# Patient Record
Sex: Female | Born: 1960 | Race: White | Hispanic: No | Marital: Single | Smoking: Current every day smoker
Health system: Southern US, Community
[De-identification: ages and names within clinical notes are randomized; demographics above are authoritative.]

## PROBLEM LIST (undated history)

## (undated) DIAGNOSIS — N898 Other specified noninflammatory disorders of vagina: Principal | ICD-10-CM

## (undated) DIAGNOSIS — J449 Chronic obstructive pulmonary disease, unspecified: Secondary | ICD-10-CM

## (undated) DIAGNOSIS — B1920 Unspecified viral hepatitis C without hepatic coma: Secondary | ICD-10-CM

## (undated) DIAGNOSIS — N760 Acute vaginitis: Secondary | ICD-10-CM

## (undated) DIAGNOSIS — IMO0002 Reserved for concepts with insufficient information to code with codable children: Secondary | ICD-10-CM

## (undated) DIAGNOSIS — B9689 Other specified bacterial agents as the cause of diseases classified elsewhere: Secondary | ICD-10-CM

## (undated) DIAGNOSIS — I10 Essential (primary) hypertension: Secondary | ICD-10-CM

## (undated) DIAGNOSIS — I639 Cerebral infarction, unspecified: Secondary | ICD-10-CM

## (undated) DIAGNOSIS — F319 Bipolar disorder, unspecified: Secondary | ICD-10-CM

## (undated) DIAGNOSIS — N952 Postmenopausal atrophic vaginitis: Secondary | ICD-10-CM

## (undated) DIAGNOSIS — B009 Herpesviral infection, unspecified: Secondary | ICD-10-CM

## (undated) DIAGNOSIS — E785 Hyperlipidemia, unspecified: Secondary | ICD-10-CM

## (undated) DIAGNOSIS — Z789 Other specified health status: Secondary | ICD-10-CM

## (undated) DIAGNOSIS — M329 Systemic lupus erythematosus, unspecified: Secondary | ICD-10-CM

## (undated) HISTORY — DX: Reserved for concepts with insufficient information to code with codable children: IMO0002

## (undated) HISTORY — DX: Other specified noninflammatory disorders of vagina: N89.8

## (undated) HISTORY — DX: Chronic obstructive pulmonary disease, unspecified: J44.9

## (undated) HISTORY — DX: Unspecified viral hepatitis C without hepatic coma: B19.20

## (undated) HISTORY — DX: Herpesviral infection, unspecified: B00.9

## (undated) HISTORY — DX: Systemic lupus erythematosus, unspecified: M32.9

## (undated) HISTORY — DX: Postmenopausal atrophic vaginitis: N95.2

## (undated) HISTORY — PX: FRACTURE SURGERY: SHX138

## (undated) HISTORY — DX: Other specified bacterial agents as the cause of diseases classified elsewhere: B96.89

## (undated) HISTORY — DX: Bipolar disorder, unspecified: F31.9

## (undated) HISTORY — DX: Hyperlipidemia, unspecified: E78.5

## (undated) HISTORY — DX: Acute vaginitis: N76.0

---

## 1993-12-18 HISTORY — PX: OTHER SURGICAL HISTORY: SHX169

## 2007-12-10 ENCOUNTER — Ambulatory Visit: Payer: Self-pay | Admitting: Internal Medicine

## 2007-12-10 DIAGNOSIS — J309 Allergic rhinitis, unspecified: Secondary | ICD-10-CM | POA: Insufficient documentation

## 2007-12-10 DIAGNOSIS — F17200 Nicotine dependence, unspecified, uncomplicated: Secondary | ICD-10-CM | POA: Insufficient documentation

## 2007-12-10 DIAGNOSIS — K219 Gastro-esophageal reflux disease without esophagitis: Secondary | ICD-10-CM | POA: Insufficient documentation

## 2007-12-10 DIAGNOSIS — M79609 Pain in unspecified limb: Secondary | ICD-10-CM

## 2007-12-10 DIAGNOSIS — I10 Essential (primary) hypertension: Secondary | ICD-10-CM | POA: Insufficient documentation

## 2007-12-10 DIAGNOSIS — M329 Systemic lupus erythematosus, unspecified: Secondary | ICD-10-CM

## 2007-12-10 DIAGNOSIS — B182 Chronic viral hepatitis C: Secondary | ICD-10-CM

## 2007-12-10 DIAGNOSIS — F319 Bipolar disorder, unspecified: Secondary | ICD-10-CM

## 2007-12-10 DIAGNOSIS — E785 Hyperlipidemia, unspecified: Secondary | ICD-10-CM

## 2007-12-10 DIAGNOSIS — M545 Low back pain: Secondary | ICD-10-CM | POA: Insufficient documentation

## 2007-12-10 DIAGNOSIS — E7800 Pure hypercholesterolemia, unspecified: Secondary | ICD-10-CM | POA: Insufficient documentation

## 2007-12-10 DIAGNOSIS — M129 Arthropathy, unspecified: Secondary | ICD-10-CM | POA: Insufficient documentation

## 2007-12-12 ENCOUNTER — Encounter (INDEPENDENT_AMBULATORY_CARE_PROVIDER_SITE_OTHER): Payer: Self-pay | Admitting: Internal Medicine

## 2007-12-16 ENCOUNTER — Ambulatory Visit (HOSPITAL_COMMUNITY): Admission: RE | Admit: 2007-12-16 | Discharge: 2007-12-16 | Payer: Self-pay | Admitting: Internal Medicine

## 2007-12-17 ENCOUNTER — Telehealth (INDEPENDENT_AMBULATORY_CARE_PROVIDER_SITE_OTHER): Payer: Self-pay | Admitting: Internal Medicine

## 2007-12-23 ENCOUNTER — Encounter (INDEPENDENT_AMBULATORY_CARE_PROVIDER_SITE_OTHER): Payer: Self-pay | Admitting: Internal Medicine

## 2007-12-23 ENCOUNTER — Other Ambulatory Visit: Admission: RE | Admit: 2007-12-23 | Discharge: 2007-12-23 | Payer: Self-pay | Admitting: Obstetrics and Gynecology

## 2008-01-02 ENCOUNTER — Encounter (HOSPITAL_COMMUNITY): Admission: RE | Admit: 2008-01-02 | Discharge: 2008-02-05 | Payer: Self-pay | Admitting: Internal Medicine

## 2008-01-02 ENCOUNTER — Encounter (INDEPENDENT_AMBULATORY_CARE_PROVIDER_SITE_OTHER): Payer: Self-pay | Admitting: Internal Medicine

## 2008-01-06 LAB — CONVERTED CEMR LAB
AST: 29 units/L (ref 0–37)
Albumin: 4.7 g/dL (ref 3.5–5.2)
Alkaline Phosphatase: 76 units/L (ref 39–117)
Basophils Relative: 1 % (ref 0–1)
CO2: 24 meq/L (ref 19–32)
Cholesterol: 173 mg/dL (ref 0–200)
Glucose, Bld: 78 mg/dL (ref 70–99)
HCT: 38.5 % (ref 36.0–46.0)
Hemoglobin: 13 g/dL (ref 12.0–15.0)
LDL Cholesterol: 89 mg/dL (ref 0–99)
Lymphocytes Relative: 44 % (ref 12–46)
MCV: 87.7 fL (ref 78.0–100.0)
Monocytes Absolute: 0.6 10*3/uL (ref 0.1–1.0)
Monocytes Relative: 12 % (ref 3–12)
Neutro Abs: 1.7 10*3/uL (ref 1.7–7.7)
RBC: 4.39 M/uL (ref 3.87–5.11)
Total Bilirubin: 0.6 mg/dL (ref 0.3–1.2)
Total CHOL/HDL Ratio: 2.9
VLDL: 24 mg/dL (ref 0–40)
WBC: 4.9 10*3/uL (ref 4.0–10.5)

## 2008-01-14 ENCOUNTER — Ambulatory Visit (HOSPITAL_COMMUNITY): Payer: Self-pay | Admitting: Psychiatry

## 2008-01-28 ENCOUNTER — Ambulatory Visit (HOSPITAL_COMMUNITY): Payer: Self-pay | Admitting: Psychiatry

## 2008-01-29 ENCOUNTER — Encounter (INDEPENDENT_AMBULATORY_CARE_PROVIDER_SITE_OTHER): Payer: Self-pay | Admitting: Internal Medicine

## 2008-01-29 ENCOUNTER — Ambulatory Visit (HOSPITAL_COMMUNITY): Payer: Self-pay | Admitting: Psychology

## 2008-02-04 ENCOUNTER — Encounter (INDEPENDENT_AMBULATORY_CARE_PROVIDER_SITE_OTHER): Payer: Self-pay | Admitting: Internal Medicine

## 2008-02-13 ENCOUNTER — Ambulatory Visit: Payer: Self-pay | Admitting: Internal Medicine

## 2008-02-19 ENCOUNTER — Ambulatory Visit (HOSPITAL_COMMUNITY): Payer: Self-pay | Admitting: Psychology

## 2008-03-02 ENCOUNTER — Ambulatory Visit (HOSPITAL_COMMUNITY): Payer: Self-pay | Admitting: Psychology

## 2008-03-04 ENCOUNTER — Ambulatory Visit (HOSPITAL_COMMUNITY): Admission: RE | Admit: 2008-03-04 | Discharge: 2008-03-04 | Payer: Self-pay | Admitting: Internal Medicine

## 2008-03-04 ENCOUNTER — Ambulatory Visit: Payer: Self-pay | Admitting: Internal Medicine

## 2008-03-04 DIAGNOSIS — L259 Unspecified contact dermatitis, unspecified cause: Secondary | ICD-10-CM | POA: Insufficient documentation

## 2008-03-05 ENCOUNTER — Encounter (INDEPENDENT_AMBULATORY_CARE_PROVIDER_SITE_OTHER): Payer: Self-pay | Admitting: Internal Medicine

## 2008-03-09 ENCOUNTER — Encounter (INDEPENDENT_AMBULATORY_CARE_PROVIDER_SITE_OTHER): Payer: Self-pay | Admitting: Internal Medicine

## 2008-03-17 ENCOUNTER — Telehealth (INDEPENDENT_AMBULATORY_CARE_PROVIDER_SITE_OTHER): Payer: Self-pay | Admitting: Internal Medicine

## 2008-03-17 ENCOUNTER — Ambulatory Visit (HOSPITAL_COMMUNITY): Payer: Self-pay | Admitting: Psychiatry

## 2008-03-31 ENCOUNTER — Encounter (INDEPENDENT_AMBULATORY_CARE_PROVIDER_SITE_OTHER): Payer: Self-pay | Admitting: Internal Medicine

## 2008-04-07 ENCOUNTER — Ambulatory Visit: Payer: Self-pay | Admitting: Internal Medicine

## 2008-04-07 DIAGNOSIS — R740 Nonspecific elevation of levels of transaminase and lactic acid dehydrogenase [LDH]: Secondary | ICD-10-CM

## 2008-04-07 DIAGNOSIS — R7402 Elevation of levels of lactic acid dehydrogenase (LDH): Secondary | ICD-10-CM | POA: Insufficient documentation

## 2008-04-09 ENCOUNTER — Ambulatory Visit (HOSPITAL_COMMUNITY): Payer: Self-pay | Admitting: Psychiatry

## 2008-04-10 LAB — CONVERTED CEMR LAB
ALT: 65 units/L — ABNORMAL HIGH (ref 0–35)
AST: 40 units/L — ABNORMAL HIGH (ref 0–37)
Albumin: 4.5 g/dL (ref 3.5–5.2)
Alkaline Phosphatase: 97 units/L (ref 39–117)
Basophils Absolute: 0.1 10*3/uL (ref 0.0–0.1)
Basophils Relative: 1 % (ref 0–1)
Eosinophils Absolute: 0.4 10*3/uL (ref 0.0–0.7)
Glucose, Bld: 104 mg/dL — ABNORMAL HIGH (ref 70–99)
HCT: 36.6 % (ref 36.0–46.0)
Hemoglobin: 12.5 g/dL (ref 12.0–15.0)
MCHC: 34.2 g/dL (ref 30.0–36.0)
Monocytes Relative: 10 % (ref 3–12)
Platelets: 170 10*3/uL (ref 150–400)
RDW: 12.5 % (ref 11.5–15.5)
WBC: 5.9 10*3/uL (ref 4.0–10.5)

## 2008-04-28 ENCOUNTER — Telehealth (INDEPENDENT_AMBULATORY_CARE_PROVIDER_SITE_OTHER): Payer: Self-pay | Admitting: *Deleted

## 2008-05-18 ENCOUNTER — Ambulatory Visit (HOSPITAL_COMMUNITY): Payer: Self-pay | Admitting: Psychology

## 2008-06-03 ENCOUNTER — Encounter (INDEPENDENT_AMBULATORY_CARE_PROVIDER_SITE_OTHER): Payer: Self-pay | Admitting: Internal Medicine

## 2008-06-05 ENCOUNTER — Ambulatory Visit (HOSPITAL_COMMUNITY): Payer: Self-pay | Admitting: Psychology

## 2008-06-15 ENCOUNTER — Encounter (INDEPENDENT_AMBULATORY_CARE_PROVIDER_SITE_OTHER): Payer: Self-pay | Admitting: Internal Medicine

## 2008-06-25 ENCOUNTER — Ambulatory Visit (HOSPITAL_COMMUNITY): Payer: Self-pay | Admitting: Psychiatry

## 2008-06-26 ENCOUNTER — Ambulatory Visit (HOSPITAL_COMMUNITY): Payer: Self-pay | Admitting: Psychology

## 2008-07-09 ENCOUNTER — Ambulatory Visit: Payer: Self-pay | Admitting: Gastroenterology

## 2008-07-17 ENCOUNTER — Ambulatory Visit (HOSPITAL_COMMUNITY): Payer: Self-pay | Admitting: Psychology

## 2008-07-28 ENCOUNTER — Encounter (INDEPENDENT_AMBULATORY_CARE_PROVIDER_SITE_OTHER): Payer: Self-pay | Admitting: Internal Medicine

## 2008-08-20 ENCOUNTER — Telehealth (INDEPENDENT_AMBULATORY_CARE_PROVIDER_SITE_OTHER): Payer: Self-pay | Admitting: Internal Medicine

## 2008-08-20 ENCOUNTER — Ambulatory Visit (HOSPITAL_COMMUNITY): Payer: Self-pay | Admitting: Psychiatry

## 2008-08-27 ENCOUNTER — Ambulatory Visit (HOSPITAL_COMMUNITY): Payer: Self-pay | Admitting: Psychology

## 2008-09-10 ENCOUNTER — Ambulatory Visit: Payer: Self-pay | Admitting: Gastroenterology

## 2008-09-11 ENCOUNTER — Ambulatory Visit (HOSPITAL_COMMUNITY): Payer: Self-pay | Admitting: Psychology

## 2008-09-18 ENCOUNTER — Encounter (INDEPENDENT_AMBULATORY_CARE_PROVIDER_SITE_OTHER): Payer: Self-pay | Admitting: Internal Medicine

## 2008-09-28 ENCOUNTER — Ambulatory Visit: Payer: Self-pay | Admitting: Internal Medicine

## 2008-09-28 ENCOUNTER — Ambulatory Visit (HOSPITAL_COMMUNITY): Payer: Self-pay | Admitting: Psychology

## 2008-09-28 DIAGNOSIS — L299 Pruritus, unspecified: Secondary | ICD-10-CM | POA: Insufficient documentation

## 2008-09-29 ENCOUNTER — Encounter (INDEPENDENT_AMBULATORY_CARE_PROVIDER_SITE_OTHER): Payer: Self-pay | Admitting: Internal Medicine

## 2008-09-29 LAB — CONVERTED CEMR LAB
ALT: 18 units/L (ref 0–35)
Albumin: 4.8 g/dL (ref 3.5–5.2)
Alkaline Phosphatase: 118 units/L — ABNORMAL HIGH (ref 39–117)
Anti Nuclear Antibody(ANA): NEGATIVE
Basophils Absolute: 0.1 10*3/uL (ref 0.0–0.1)
Basophils Relative: 1 % (ref 0–1)
Calcium: 10.2 mg/dL (ref 8.4–10.5)
Eosinophils Absolute: 0.5 10*3/uL (ref 0.0–0.7)
Eosinophils Relative: 8 % — ABNORMAL HIGH (ref 0–5)
Lymphocytes Relative: 41 % (ref 12–46)
Lymphs Abs: 2.2 10*3/uL (ref 0.7–4.0)
MCHC: 33.9 g/dL (ref 30.0–36.0)
MCV: 84.8 fL (ref 78.0–100.0)
Monocytes Absolute: 0.4 10*3/uL (ref 0.1–1.0)
Platelets: 172 10*3/uL (ref 150–400)
RBC: 4.94 M/uL (ref 3.87–5.11)
RDW: 13 % (ref 11.5–15.5)
Sed Rate: 17 mm/hr (ref 0–22)
Total Protein: 8.6 g/dL — ABNORMAL HIGH (ref 6.0–8.3)
WBC: 5.4 10*3/uL (ref 4.0–10.5)

## 2008-09-30 ENCOUNTER — Telehealth (INDEPENDENT_AMBULATORY_CARE_PROVIDER_SITE_OTHER): Payer: Self-pay | Admitting: *Deleted

## 2008-10-08 ENCOUNTER — Ambulatory Visit: Payer: Self-pay | Admitting: Gastroenterology

## 2008-10-13 ENCOUNTER — Ambulatory Visit (HOSPITAL_COMMUNITY): Payer: Self-pay | Admitting: Psychology

## 2008-10-20 ENCOUNTER — Encounter (INDEPENDENT_AMBULATORY_CARE_PROVIDER_SITE_OTHER): Payer: Self-pay | Admitting: Internal Medicine

## 2008-10-29 ENCOUNTER — Ambulatory Visit: Payer: Self-pay | Admitting: Internal Medicine

## 2008-10-29 ENCOUNTER — Ambulatory Visit (HOSPITAL_COMMUNITY): Payer: Self-pay | Admitting: Psychiatry

## 2008-11-16 ENCOUNTER — Encounter (INDEPENDENT_AMBULATORY_CARE_PROVIDER_SITE_OTHER): Payer: Self-pay | Admitting: Internal Medicine

## 2008-11-17 ENCOUNTER — Ambulatory Visit (HOSPITAL_COMMUNITY): Payer: Self-pay | Admitting: Psychiatry

## 2008-11-20 ENCOUNTER — Ambulatory Visit (HOSPITAL_COMMUNITY): Payer: Self-pay | Admitting: Psychology

## 2008-12-21 ENCOUNTER — Encounter (INDEPENDENT_AMBULATORY_CARE_PROVIDER_SITE_OTHER): Payer: Self-pay | Admitting: Internal Medicine

## 2011-05-15 DIAGNOSIS — F3163 Bipolar disorder, current episode mixed, severe, without psychotic features: Secondary | ICD-10-CM | POA: Diagnosis not present

## 2011-06-26 DIAGNOSIS — F3163 Bipolar disorder, current episode mixed, severe, without psychotic features: Secondary | ICD-10-CM | POA: Diagnosis not present

## 2011-07-03 DIAGNOSIS — F3163 Bipolar disorder, current episode mixed, severe, without psychotic features: Secondary | ICD-10-CM | POA: Diagnosis not present

## 2011-09-19 DIAGNOSIS — F3163 Bipolar disorder, current episode mixed, severe, without psychotic features: Secondary | ICD-10-CM | POA: Diagnosis not present

## 2011-09-25 DIAGNOSIS — F3163 Bipolar disorder, current episode mixed, severe, without psychotic features: Secondary | ICD-10-CM | POA: Diagnosis not present

## 2011-10-04 DIAGNOSIS — F3163 Bipolar disorder, current episode mixed, severe, without psychotic features: Secondary | ICD-10-CM | POA: Diagnosis not present

## 2011-10-04 DIAGNOSIS — F17200 Nicotine dependence, unspecified, uncomplicated: Secondary | ICD-10-CM | POA: Diagnosis not present

## 2011-10-04 DIAGNOSIS — E785 Hyperlipidemia, unspecified: Secondary | ICD-10-CM | POA: Diagnosis not present

## 2011-10-04 DIAGNOSIS — J449 Chronic obstructive pulmonary disease, unspecified: Secondary | ICD-10-CM | POA: Diagnosis not present

## 2011-10-04 DIAGNOSIS — M79609 Pain in unspecified limb: Secondary | ICD-10-CM | POA: Diagnosis not present

## 2011-12-08 DIAGNOSIS — F3163 Bipolar disorder, current episode mixed, severe, without psychotic features: Secondary | ICD-10-CM | POA: Diagnosis not present

## 2012-02-05 DIAGNOSIS — F3163 Bipolar disorder, current episode mixed, severe, without psychotic features: Secondary | ICD-10-CM | POA: Diagnosis not present

## 2012-03-08 DIAGNOSIS — F17200 Nicotine dependence, unspecified, uncomplicated: Secondary | ICD-10-CM | POA: Diagnosis not present

## 2012-03-08 DIAGNOSIS — Z23 Encounter for immunization: Secondary | ICD-10-CM | POA: Diagnosis not present

## 2012-03-08 DIAGNOSIS — B009 Herpesviral infection, unspecified: Secondary | ICD-10-CM | POA: Diagnosis not present

## 2012-03-08 DIAGNOSIS — N950 Postmenopausal bleeding: Secondary | ICD-10-CM | POA: Diagnosis not present

## 2012-03-08 DIAGNOSIS — J449 Chronic obstructive pulmonary disease, unspecified: Secondary | ICD-10-CM | POA: Diagnosis not present

## 2012-04-01 DIAGNOSIS — F3163 Bipolar disorder, current episode mixed, severe, without psychotic features: Secondary | ICD-10-CM | POA: Diagnosis not present

## 2012-04-30 DIAGNOSIS — N950 Postmenopausal bleeding: Secondary | ICD-10-CM | POA: Diagnosis not present

## 2012-04-30 DIAGNOSIS — N9489 Other specified conditions associated with female genital organs and menstrual cycle: Secondary | ICD-10-CM | POA: Diagnosis not present

## 2012-05-07 DIAGNOSIS — N950 Postmenopausal bleeding: Secondary | ICD-10-CM | POA: Diagnosis not present

## 2012-05-21 DIAGNOSIS — N950 Postmenopausal bleeding: Secondary | ICD-10-CM | POA: Diagnosis not present

## 2012-05-21 DIAGNOSIS — N952 Postmenopausal atrophic vaginitis: Secondary | ICD-10-CM | POA: Diagnosis not present

## 2012-06-04 DIAGNOSIS — Z01419 Encounter for gynecological examination (general) (routine) without abnormal findings: Secondary | ICD-10-CM | POA: Diagnosis not present

## 2012-06-04 DIAGNOSIS — N950 Postmenopausal bleeding: Secondary | ICD-10-CM | POA: Diagnosis not present

## 2012-06-04 DIAGNOSIS — N952 Postmenopausal atrophic vaginitis: Secondary | ICD-10-CM | POA: Diagnosis not present

## 2012-08-02 DIAGNOSIS — Z006 Encounter for examination for normal comparison and control in clinical research program: Secondary | ICD-10-CM | POA: Diagnosis not present

## 2012-08-02 DIAGNOSIS — E782 Mixed hyperlipidemia: Secondary | ICD-10-CM | POA: Diagnosis not present

## 2012-08-02 DIAGNOSIS — F3161 Bipolar disorder, current episode mixed, mild: Secondary | ICD-10-CM | POA: Diagnosis not present

## 2012-08-02 DIAGNOSIS — G4700 Insomnia, unspecified: Secondary | ICD-10-CM | POA: Diagnosis not present

## 2012-08-02 DIAGNOSIS — F3162 Bipolar disorder, current episode mixed, moderate: Secondary | ICD-10-CM | POA: Diagnosis not present

## 2012-09-04 DIAGNOSIS — Z1231 Encounter for screening mammogram for malignant neoplasm of breast: Secondary | ICD-10-CM | POA: Diagnosis not present

## 2012-09-04 DIAGNOSIS — Z803 Family history of malignant neoplasm of breast: Secondary | ICD-10-CM | POA: Diagnosis not present

## 2012-09-23 DIAGNOSIS — F3163 Bipolar disorder, current episode mixed, severe, without psychotic features: Secondary | ICD-10-CM | POA: Diagnosis not present

## 2012-12-09 DIAGNOSIS — F3163 Bipolar disorder, current episode mixed, severe, without psychotic features: Secondary | ICD-10-CM | POA: Diagnosis not present

## 2013-01-27 DIAGNOSIS — R1013 Epigastric pain: Secondary | ICD-10-CM | POA: Diagnosis not present

## 2013-01-27 DIAGNOSIS — K273 Acute peptic ulcer, site unspecified, without hemorrhage or perforation: Secondary | ICD-10-CM | POA: Diagnosis not present

## 2013-02-06 DIAGNOSIS — R109 Unspecified abdominal pain: Secondary | ICD-10-CM | POA: Diagnosis not present

## 2013-04-23 DIAGNOSIS — J0120 Acute ethmoidal sinusitis, unspecified: Secondary | ICD-10-CM | POA: Diagnosis not present

## 2013-04-23 DIAGNOSIS — J069 Acute upper respiratory infection, unspecified: Secondary | ICD-10-CM | POA: Diagnosis not present

## 2013-11-21 DIAGNOSIS — R7309 Other abnormal glucose: Secondary | ICD-10-CM | POA: Diagnosis not present

## 2013-11-21 DIAGNOSIS — Z0000 Encounter for general adult medical examination without abnormal findings: Secondary | ICD-10-CM | POA: Diagnosis not present

## 2013-11-21 DIAGNOSIS — M549 Dorsalgia, unspecified: Secondary | ICD-10-CM | POA: Diagnosis not present

## 2013-11-21 DIAGNOSIS — E7800 Pure hypercholesterolemia, unspecified: Secondary | ICD-10-CM | POA: Diagnosis not present

## 2013-11-21 DIAGNOSIS — K219 Gastro-esophageal reflux disease without esophagitis: Secondary | ICD-10-CM | POA: Diagnosis not present

## 2013-12-10 ENCOUNTER — Telehealth: Payer: Self-pay

## 2013-12-10 DIAGNOSIS — F17200 Nicotine dependence, unspecified, uncomplicated: Secondary | ICD-10-CM | POA: Diagnosis not present

## 2013-12-10 DIAGNOSIS — E7800 Pure hypercholesterolemia, unspecified: Secondary | ICD-10-CM | POA: Diagnosis not present

## 2013-12-10 DIAGNOSIS — B182 Chronic viral hepatitis C: Secondary | ICD-10-CM | POA: Diagnosis not present

## 2013-12-10 NOTE — Telephone Encounter (Signed)
Gastroenterology Pre-Procedure Review  Request Date: Requesting Physician: Fanta  PATIENT REVIEW QUESTIONS: The patient responded to the following health history questions as indicated:    1. Diabetes Melitis: NO 2. Joint replacements in the past 12 months: NO 3. Major health problems in the past 3 months: NO 4. Has an artificial valve or MVP: NO 5. Has a defibrillator: NO 6. Has been advised in past to take antibiotics in advance of a procedure like teeth cleaning: NO 7. Acholic: NO 8. Family history:YES(aunt)     MEDICATIONS & ALLERGIES:    NKDA  Patient reports the following regarding taking any blood thinners:   Plavix? NO Aspirin? NO Coumadin? NO  Patient confirms/reports the following medications:  No current outpatient prescriptions on file.   No current facility-administered medications for this visit.    Patient confirms/reports the following allergies:  Allergies  Allergen Reactions  . Erythromycin     REACTION: GI upset    No orders of the defined types were placed in this encounter.    AUTHORIZATION INFORMATION Primary Insurance: Medicare,  ID #: [id number]  Group #: Pre-Cert / Josem Kaufmann required: Pre-Cert / Auth #:   Secondary Insurance: Medicaid,  ID #: [id number]   Group #:  Pre-Cert / Auth required:  Pre-Cert / Auth #:  SCHEDULE INFORMATION: Procedure has been scheduled as follows:  Date:  Time:  Location:   This Gastroenterology Pre-Precedure Review Form is being routed to the following provider(s):

## 2013-12-11 ENCOUNTER — Encounter: Payer: Self-pay | Admitting: Internal Medicine

## 2013-12-12 NOTE — Telephone Encounter (Signed)
PT called and is scheduled for 12/24/2013 at 8:30 AM with Dr. Oneida Alar.

## 2013-12-12 NOTE — Telephone Encounter (Signed)
Time for procedure will be 9:30 Am not 8:30 and pt is aware. ( Someone got put on before her).

## 2013-12-12 NOTE — Telephone Encounter (Signed)
LMOM for pt to call for date and time for colonoscopy.

## 2013-12-12 NOTE — Telephone Encounter (Signed)
Pt is returning your call. Please call her back.

## 2013-12-17 ENCOUNTER — Encounter (HOSPITAL_COMMUNITY): Payer: Self-pay | Admitting: Pharmacy Technician

## 2013-12-17 ENCOUNTER — Other Ambulatory Visit: Payer: Self-pay

## 2013-12-17 DIAGNOSIS — Z1211 Encounter for screening for malignant neoplasm of colon: Secondary | ICD-10-CM

## 2013-12-17 NOTE — Telephone Encounter (Signed)
MOVI PREP SPLIT DOSING- FULL LIQUIDS WITH BREAKFAST.  Full Liquid Diet A high-calorie, high-protein supplement should be used to meet your nutritional requirements when the full liquid diet is continued for more than 2 or 3 days. If this diet is to be used for an extended period of time (more than 7 days), a multivitamin should be considered.  Breads and Starches  Allowed: None are allowed except crackers WHOLE OR pureed (made into a thick, smooth soup) in soup. Avoid: Any others.    Potatoes/Pasta/Rice  Allowed: ANY ITEM AS A SOUP OR SMALL PLATE OF MASHED POTATOES..       Vegetables  Allowed: Strained tomato or vegetable juice. Vegetables pureed in soup.   Avoid: Any others.    Fruit  Allowed: Any strained fruit juices and fruit drinks. Include 1 serving of citrus or vitamin C-enriched fruit juice daily.   Avoid: Any others.  Meat and Meat Substitutes  Allowed: Egg  Avoid: Any meat, fish, or fowl. All cheese.  Milk  Allowed: Milk beverages, including milk shakes and instant breakfast mixes. Smooth yogurt.   Avoid: Any others. Avoid dairy products if not tolerated.    Soups and Combination Foods  Allowed: Broth, strained cream soups. Strained, broth-based soups.   Avoid: Any others.    Desserts and Sweets  Allowed: flavored gelatin, tapioca, plain ice cream, sherbet, smooth pudding, junket, fruit ices, frozen ice pops, pudding pops,, frozen fudge pops, chocolate syrup. Sugar, honey, jelly, syrup.   Avoid: Any others.  Fats and Oils  Allowed: Margarine, butter, cream, sour cream, oils.   Avoid: Any others.  Beverages  Allowed: All.   Avoid: None.  Condiments  Allowed: Iodized salt, pepper, spices, flavorings. Cocoa powder.   Avoid: Any others.    SAMPLE MEAL PLAN Breakfast   cup orange juice.   1 EGG  1 cup  milk.   1 cup beverage (coffee or tea).   Cream or sugar, if desired.    Midmorning Snack  2 SCRAMBLED OR HARD BOILED  EGG   Lunch  1 cup cream soup.    cup fruit juice.   1 cup milk.    cup custard.   1 cup beverage (coffee or tea).   Cream or sugar, if desired.    Midafternoon Snack  1 cup milk shake.  Dinner  1 cup cream soup.    cup fruit juice.   1 cup milk.    cup pudding.   1 cup beverage (coffee or tea).   Cream or sugar, if desired.  Evening Snack  1 cup supplement.  To increase calories, add sugar, cream, butter, or margarine if possible. Nutritional supplements will also increase the total calories.

## 2013-12-18 MED ORDER — PEG-KCL-NACL-NASULF-NA ASC-C 100 G PO SOLR: 1.0000 | ORAL | Status: DC

## 2013-12-18 NOTE — Telephone Encounter (Signed)
Rx sent to the pharmacy and instructions mailed to pt.  

## 2013-12-18 NOTE — Addendum Note (Signed)
Addended by: Everardo All on: 12/18/2013 02:55 PM   Modules accepted: Orders

## 2013-12-24 ENCOUNTER — Ambulatory Visit (HOSPITAL_COMMUNITY)
Admission: RE | Admit: 2013-12-24 | Discharge: 2013-12-24 | Disposition: A | Discharge status: Home / Self Care | Payer: Medicare Other | Source: Ambulatory Visit | Attending: Gastroenterology | Admitting: Gastroenterology

## 2013-12-24 ENCOUNTER — Encounter (HOSPITAL_COMMUNITY): Payer: Self-pay

## 2013-12-24 ENCOUNTER — Encounter (HOSPITAL_COMMUNITY)
Admission: RE | Disposition: A | Discharge status: Home / Self Care | Payer: Self-pay | Source: Ambulatory Visit | Attending: Gastroenterology

## 2013-12-24 DIAGNOSIS — Q438 Other specified congenital malformations of intestine: Secondary | ICD-10-CM | POA: Insufficient documentation

## 2013-12-24 DIAGNOSIS — K648 Other hemorrhoids: Secondary | ICD-10-CM | POA: Insufficient documentation

## 2013-12-24 DIAGNOSIS — Z79899 Other long term (current) drug therapy: Secondary | ICD-10-CM | POA: Insufficient documentation

## 2013-12-24 DIAGNOSIS — D126 Benign neoplasm of colon, unspecified: Secondary | ICD-10-CM | POA: Diagnosis not present

## 2013-12-24 DIAGNOSIS — Z1211 Encounter for screening for malignant neoplasm of colon: Secondary | ICD-10-CM

## 2013-12-24 DIAGNOSIS — Z881 Allergy status to other antibiotic agents status: Secondary | ICD-10-CM | POA: Insufficient documentation

## 2013-12-24 DIAGNOSIS — F17200 Nicotine dependence, unspecified, uncomplicated: Secondary | ICD-10-CM | POA: Insufficient documentation

## 2013-12-24 DIAGNOSIS — Z800 Family history of malignant neoplasm of digestive organs: Secondary | ICD-10-CM | POA: Insufficient documentation

## 2013-12-24 HISTORY — DX: Other specified health status: Z78.9

## 2013-12-24 HISTORY — PX: COLONOSCOPY: SHX5424

## 2013-12-24 SURGERY — COLONOSCOPY
Anesthesia: Moderate Sedation

## 2013-12-24 MED ORDER — MEPERIDINE HCL 100 MG/ML IJ SOLN
INTRAMUSCULAR | Status: AC
  Filled 2013-12-24: qty 2

## 2013-12-24 MED ORDER — MEPERIDINE HCL 100 MG/ML IJ SOLN
INTRAMUSCULAR | Status: DC | PRN
  Administered 2013-12-24 (×3): 25 mg via INTRAVENOUS

## 2013-12-24 MED ORDER — SODIUM CHLORIDE 0.9 % IV SOLN
INTRAVENOUS | Status: DC
  Administered 2013-12-24: 09:00:00 via INTRAVENOUS

## 2013-12-24 MED ORDER — MIDAZOLAM HCL 5 MG/5ML IJ SOLN
INTRAMUSCULAR | Status: DC | PRN
  Administered 2013-12-24: 2 mg via INTRAVENOUS
  Administered 2013-12-24: 1 mg via INTRAVENOUS
  Administered 2013-12-24: 2 mg via INTRAVENOUS

## 2013-12-24 MED ORDER — MIDAZOLAM HCL 5 MG/5ML IJ SOLN
INTRAMUSCULAR | Status: AC
  Filled 2013-12-24: qty 10

## 2013-12-24 MED ORDER — STERILE WATER FOR IRRIGATION IR SOLN
Status: DC | PRN
  Administered 2013-12-24: 09:00:00

## 2013-12-24 NOTE — Discharge Instructions (Signed)
You had 2 polyps removed.    FOLLOW A HIGH FIBER DIET. AVOID ITEMS THAT CAUSE BLOATING. SEE INFO BELOW.  YOUR BIOPSY RESULTS SHOULD BE BACK IN 14 DAYS.  Next colonoscopy in 3-5 years.  Colonoscopy Care After Read the instructions outlined below and refer to this sheet in the next week. These discharge instructions provide you with general information on caring for yourself after you leave the hospital. While your treatment has been planned according to the most current medical practices available, unavoidable complications occasionally occur. If you have any problems or questions after discharge, call DR. Chelsey Redondo, [phone].  ACTIVITY  You may resume your regular activity, but move at a slower pace for the next 24 hours.   Take frequent rest periods for the next 24 hours.   Walking will help get rid of the air and reduce the bloated feeling in your belly (abdomen).   No driving for 24 hours (because of the medicine (anesthesia) used during the test).   You may shower.   Do not sign any important legal documents or operate any machinery for 24 hours (because of the anesthesia used during the test).    NUTRITION  Drink plenty of fluids.   You may resume your normal diet as instructed by your doctor.   Begin with a light meal and progress to your normal diet. Heavy or fried foods are harder to digest and may make you feel sick to your stomach (nauseated).   Avoid alcoholic beverages for 24 hours or as instructed.    MEDICATIONS  You may resume your normal medications.   WHAT YOU CAN EXPECT TODAY  Some feelings of bloating in the abdomen.   Passage of more gas than usual.   Spotting of blood in your stool or on the toilet paper  .  IF YOU HAD POLYPS REMOVED DURING THE COLONOSCOPY:  Eat a soft diet IF YOU HAVE NAUSEA, BLOATING, ABDOMINAL PAIN, OR VOMITING.    FINDING OUT THE RESULTS OF YOUR TEST Not all test results are available during your visit. DR. Oneida Alar  WILL CALL YOU WITHIN 7 DAYS OF YOUR PROCEDUE WITH YOUR RESULTS. Do not assume everything is normal if you have not heard from DR. Brandon Scarbrough IN ONE WEEK, CALL HER OFFICE AT [phone].  SEEK IMMEDIATE MEDICAL ATTENTION AND CALL THE OFFICE: [phone] IF:  You have more than a spotting of blood in your stool.   Your belly is swollen (abdominal distention).   You are nauseated or vomiting.   You have a temperature over 101F.   You have abdominal pain or discomfort that is severe or gets worse throughout the day.   High-Fiber Diet A high-fiber diet changes your normal diet to include more whole grains, legumes, fruits, and vegetables. Changes in the diet involve replacing refined carbohydrates with unrefined foods. The calorie level of the diet is essentially unchanged. The Dietary Reference Intake (recommended amount) for adult males is 38 grams per day. For adult females, it is 25 grams per day. Pregnant and lactating women should consume 28 grams of fiber per day. Fiber is the intact part of a plant that is not broken down during digestion. Functional fiber is fiber that has been isolated from the plant to provide a beneficial effect in the body. PURPOSE  Increase stool bulk.   Ease and regulate bowel movements.   Lower cholesterol.  INDICATIONS THAT YOU NEED MORE FIBER  Constipation and hemorrhoids.   Uncomplicated diverticulosis (intestine condition) and irritable bowel syndrome.  Weight management.   As a protective measure against hardening of the arteries (atherosclerosis), diabetes, and cancer.   GUIDELINES FOR INCREASING FIBER IN THE DIET  Start adding fiber to the diet slowly. A gradual increase of about 5 more grams (2 slices of whole-wheat bread, 2 servings of most fruits or vegetables, or 1 bowl of high-fiber cereal) per day is best. Too rapid an increase in fiber may result in constipation, flatulence, and bloating.   Drink enough water and fluids to keep your  urine clear or pale yellow. Water, juice, or caffeine-free drinks are recommended. Not drinking enough fluid may cause constipation.   Eat a variety of high-fiber foods rather than one type of fiber.   Try to increase your intake of fiber through using high-fiber foods rather than fiber pills or supplements that contain small amounts of fiber.   The goal is to change the types of food eaten. Do not supplement your present diet with high-fiber foods, but replace foods in your present diet.  INCLUDE A VARIETY OF FIBER SOURCES  Replace refined and processed grains with whole grains, canned fruits with fresh fruits, and incorporate other fiber sources. White rice, white breads, and most bakery goods contain little or no fiber.   Brown whole-grain rice, buckwheat oats, and many fruits and vegetables are all good sources of fiber. These include: broccoli, Brussels sprouts, cabbage, cauliflower, beets, sweet potatoes, white potatoes (skin on), carrots, tomatoes, eggplant, squash, berries, fresh fruits, and dried fruits.   Cereals appear to be the richest source of fiber. Cereal fiber is found in whole grains and bran. Bran is the fiber-rich outer coat of cereal grain, which is largely removed in refining. In whole-grain cereals, the bran remains. In breakfast cereals, the largest amount of fiber is found in those with "bran" in their names. The fiber content is sometimes indicated on the label.   You may need to include additional fruits and vegetables each day.   In baking, for 1 cup white flour, you may use the following substitutions:   1 cup whole-wheat flour minus 2 tablespoons.   1/2 cup white flour plus 1/2 cup whole-wheat flour.   Polyps, Colon  A polyp is extra tissue that grows inside your body. Colon polyps grow in the large intestine. The large intestine, also called the colon, is part of your digestive system. It is a long, hollow tube at the end of your digestive tract where your body  makes and stores stool. Most polyps are not dangerous. They are benign. This means they are not cancerous. But over time, some types of polyps can turn into cancer. Polyps that are smaller than a pea are usually not harmful. But larger polyps could someday become or may already be cancerous. To be safe, doctors remove all polyps and test them.   WHO GETS POLYPS? Anyone can get polyps, but certain people are more likely than others. You may have a greater chance of getting polyps if:  You are over 50.   You have had polyps before.   Someone in your family has had polyps.   Someone in your family has had cancer of the large intestine.   Find out if someone in your family has had polyps. You may also be more likely to get polyps if you:   Eat a lot of fatty foods   Smoke   Drink alcohol   Do not exercise  Eat too much   TREATMENT  The caregiver will remove the  polyp during sigmoidoscopy or colonoscopy.    PREVENTION There is not one sure way to prevent polyps. You might be able to lower your risk of getting them if you:  Eat more fruits and vegetables and less fatty food.   Do not smoke.   Avoid alcohol.   Exercise every day.   Lose weight if you are overweight.   Eating more calcium and folate can also lower your risk of getting polyps. Some foods that are rich in calcium are milk, cheese, and broccoli. Some foods that are rich in folate are chickpeas, kidney beans, and spinach.

## 2013-12-24 NOTE — H&P (Addendum)
  Primary Care Physician:  Rosita Fire, MD Primary Gastroenterologist:  Dr. Oneida Alar  Pre-Procedure History & Physical: HPI:  Brittany Lester is a 53 y.o. female here for [place].  Past Medical History  Diagnosis Date  . Medical history non-contributory     Past Surgical History  Procedure Laterality Date  . C section  12/18/1993  . Fracture surgery Right     20 years ago    Prior to Admission medications   Medication Sig Start Date End Date Taking? Authorizing Provider  acyclovir (ZOVIRAX) 400 MG tablet Take 400 mg by mouth 2 (two) times daily.   Yes Historical Provider, MD  lithium carbonate (ESKALITH) 450 MG CR tablet Take 450 mg by mouth at bedtime.   Yes Historical Provider, MD  omeprazole (PRILOSEC) 20 MG capsule Take 20 mg by mouth daily.   Yes Historical Provider, MD  peg 3350 powder (MOVIPREP) 100 G SOLR Take 1 kit (200 g total) by mouth as directed. 12/18/13  Yes Danie Binder, MD  rosuvastatin (CRESTOR) 20 MG tablet Take 20 mg by mouth daily.   Yes Historical Provider, MD  traMADol (ULTRAM) 50 MG tablet Take 100 mg by mouth 2 (two) times daily.    Yes Historical Provider, MD    Allergies as of 12/17/2013 - Review Complete 12/17/2013  Allergen Reaction Noted  . Erythromycin      Family History  Problem Relation Age of Onset  . Colon cancer Maternal Aunt     History   Social History  . Marital Status: Single    Spouse Name: N/A    Number of Children: N/A  . Years of Education: N/A   Occupational History  . Not on file.   Social History Main Topics  . Smoking status: Heavy Tobacco Smoker  . Smokeless tobacco: Not on file  . Alcohol Use: No  . Drug Use: No  . Sexual Activity: Not on file   Other Topics Concern  . Not on file   Social History Narrative  . No narrative on file    Review of Systems: See HPI, otherwise negative ROS   Physical Exam: There were no vitals taken for this visit. General:   Alert,  pleasant and  cooperative in NAD Head:  Normocephalic and atraumatic. Neck:  Supple; Lungs:  Clear throughout to auscultation.    Heart:  Regular rate and rhythm. Abdomen:  Soft, nontender and nondistended. Normal bowel sounds, without guarding, and without rebound.   Neurologic:  Alert and  oriented x4;  grossly normal neurologically.  Impression/Plan:     SCREENING  Plan:  1. TCS TODAY

## 2013-12-25 NOTE — Op Note (Signed)
[facility] [address] [place], [address]   COLONOSCOPY PROCEDURE REPORT  PATIENT: Brittany, Lester  MR#: [id number] BIRTHDATE: 04/30/1961 , 26  yrs. old GENDER: Female ENDOSCOPIST: Barney Drain, MD REFERRED [id number] Fanta, M.D. PROCEDURE DATE:  12/24/2013 PROCEDURE:   Colonoscopy with snare polypectomy and Colonoscopy with cold biopsy polypectomy INDICATIONS:Average risk patient for colon cancer. MEDICATIONS: Demerol 75 mg IV and Versed 5 mg IV  DESCRIPTION OF PROCEDURE:    Physical exam was performed.  Informed consent was obtained from the patient after explaining the benefits, risks, and alternatives to procedure.  The patient was connected to monitor and placed in left lateral position. Continuous oxygen was provided by nasal cannula and IV medicine administered through an indwelling cannula.  After administration of sedation and rectal exam, the patients rectum was intubated and the EC-3890Li ([id number])  colonoscope was advanced under direct visualization to the ileum.  The scope was removed slowly by carefully examining the color, texture, anatomy, and integrity mucosa on the way out.  The patient was recovered in endoscopy and discharged home in satisfactory condition.    COLON FINDINGS: The mucosa appeared normal in the terminal ileum.  , A sessile polyp measuring 3 mm in size was found at the cecum.  A polypectomy was performed with cold forceps.  , A sessile polyp measuring 8 mm in size was found at the hepatic flexure.  A polypectomy was performed using snare cautery.  , The LEFT colon IS redundant.  Manual abdominal counter-pressure was used to reach the cecum.  The patient was moved on to their back to reach the cecum, and Small internal hemorrhoids were found.  PREP QUALITY: good.  CECAL W/D TIME: 13 minutes     COMPLICATIONS: PT AGITATED AND MOANING DURING TCS BUT HAD NO RECALL IN POSTOP.  ENDOSCOPIC IMPRESSION: 1.   Normal mucosa in  the terminal ileum 2.   TWO colon polyps removed 4.   The LEFT colon IS redundant 5.   Small internal hemorrhoids   RECOMMENDATIONS: FOLLOW A HIGH FIBER DIET.  AVOID ITEMS THAT CAUSE BLOATING. BIOPSY RESULTS SHOULD BE BACK IN 14 DAYS.  Next colonoscopy WITH AN OVERTUBE in 3 YEARS if advanced polyps and 5 years if simple adenomas.       _______________________________ Lorrin MaisBarney Drain, MD 12/25/2013 1:58 PM

## 2013-12-26 ENCOUNTER — Encounter (HOSPITAL_COMMUNITY): Payer: Self-pay | Admitting: Gastroenterology

## 2013-12-31 ENCOUNTER — Telehealth: Payer: Self-pay | Admitting: Gastroenterology

## 2013-12-31 NOTE — Telephone Encounter (Signed)
Called and informed pt.  

## 2013-12-31 NOTE — Telephone Encounter (Signed)
Reminder in epic °

## 2013-12-31 NOTE — Telephone Encounter (Signed)
Please call pt. She had simple adenomas removed from her colon.    FOLLOW A HIGH FIBER DIET. AVOID ITEMS THAT CAUSE BLOATING.   Next colonoscopy in 5 years with an overtube.Marland Kitchen

## 2014-01-06 ENCOUNTER — Encounter: Payer: Self-pay | Admitting: Gastroenterology

## 2014-01-06 ENCOUNTER — Ambulatory Visit (INDEPENDENT_AMBULATORY_CARE_PROVIDER_SITE_OTHER): Payer: Medicare Other | Admitting: Gastroenterology

## 2014-01-06 VITALS — BP 155/97 | HR 74 | Temp 98.2°F | Ht 63.0 in | Wt 95.8 lb

## 2014-01-06 DIAGNOSIS — B182 Chronic viral hepatitis C: Secondary | ICD-10-CM

## 2014-01-06 DIAGNOSIS — R1084 Generalized abdominal pain: Secondary | ICD-10-CM | POA: Insufficient documentation

## 2014-01-06 DIAGNOSIS — R634 Abnormal weight loss: Secondary | ICD-10-CM | POA: Insufficient documentation

## 2014-01-06 DIAGNOSIS — R109 Unspecified abdominal pain: Secondary | ICD-10-CM | POA: Diagnosis not present

## 2014-01-06 NOTE — Progress Notes (Signed)
Primary Care Physician: Rosita Fire, MD  Primary Gastroenterologist:  Barney Drain, MD   Chief Complaint  Patient presents with  . Hepatitis C    HPI: Brittany Lester is a 53 y.o. female here for further evaluation of hepatitis C. We recently performed a screening colonoscopy on the patient and she was found to have multiple tubular adenomas. Due for followup exam in 5 years.  Patient actually had multiple GI issues but did not feel like addressing them at the time. She reports that she was diagnosed with hepatitis C around 2002. She underwent treatment around 2004 in [place], received about 3 months of ?ribavirin/interferon and decided to stop therapy on her own due to depression, weakness, feeling sick. She was subsequently seen by Dr. Patsy Baltimore in 2010 but treatment not offered at that time. Patient has failed to follow up with anyone regarding HCV.    Last one year, very fatiqued. She has lost significant amount of weight from 126 down to 95lb. Most of weight loss in the past 6 months. Appetite okay. Fatigue over the past six months. Thought due to move from [place] six months ago. Right flank pain at times. Chronic Back/leg pain on tramadol for years. One year of horrible stomach ache. One month of omeprazole was feeling better. But then didn't seem to help so stopped medication eventually. Days with pain, can't stand up. May last several days at a time without relief. No black, brbpr. Previously ate lots of rolaids and Alka-Seltzer without relief.       Current Outpatient Prescriptions  Medication Sig Dispense Refill  . acyclovir (ZOVIRAX) 400 MG tablet Take 400 mg by mouth 2 (two) times daily.      Marland Kitchen lithium carbonate (ESKALITH) 450 MG CR tablet Take 450 mg by mouth at bedtime.      . rosuvastatin (CRESTOR) 20 MG tablet Take 20 mg by mouth daily.      . traMADol (ULTRAM) 50 MG tablet Take 100 mg by mouth 2 (two) times daily.        No current facility-administered  medications for this visit.    Allergies as of 01/06/2014 - Review Complete 01/06/2014  Allergen Reaction Noted  . Erythromycin     Past Medical History  Diagnosis Date  . Medical history non-contributory   . Hepatitis C   . Hyperlipidemia   . Bipolar disorder   . Lupus     ???   Past Surgical History  Procedure Laterality Date  . C section  12/18/1993  . Fracture surgery Right     20 years ago  . Colonoscopy N/A 12/24/2013    RCV:ELFYBO mucosa in the terminal ileum/TWO colon polyps removed/ The LEFT colon IS redundant/Small internal hemorrhoids. tubular adenoma. next TCS 12/2018   Family History  Problem Relation Age of Onset  . Colon cancer Maternal Aunt     age 7  . Liver disease Neg Hx    History   Social History  . Marital Status: Single    Spouse Name: N/A    Number of Children: 2  . Years of Education: N/A   Social History Main Topics  . Smoking status: Heavy Tobacco Smoker  . Smokeless tobacco: None  . Alcohol Use: No     Comment: remote heavy etoh use  . Drug Use: No     Comment: H/O IVDU, quit 2006  . Sexual Activity: None   Other Topics Concern  . None   Social History Narrative  .  None    ROS:  General: Negative for anorexia, weight loss, fever, chills, fatigue, weakness. ENT: Negative for hoarseness, difficulty swallowing , nasal congestion. CV: Negative for chest pain, angina, palpitations, dyspnea on exertion, peripheral edema.  Respiratory: Negative for dyspnea at rest, dyspnea on exertion, cough, sputum, wheezing.  GI: See history of present illness. GU:  Negative for dysuria, hematuria, urinary incontinence, urinary frequency, nocturnal urination.  Endo: Negative for unusual weight change.    Physical Examination:   BP 155/97  Pulse 74  Temp(Src) 98.2 F (36.8 C) (Oral)  Ht 5\' 3"  (1.6 m)  Wt 95 lb 12.8 oz (43.455 kg)  BMI 16.97 kg/m2  General: Well-nourished, well-developed in no acute distress.  Eyes: No icterus. Mouth:  Oropharyngeal mucosa moist and pink , no lesions erythema or exudate. Lungs: Clear to auscultation bilaterally.  Heart: Regular rate and rhythm, no murmurs rubs or gallops.  Abdomen: Bowel sounds are normal, nontender, nondistended, no hepatosplenomegaly or masses, no abdominal bruits or hernia , no rebound or guarding.   Extremities: No lower extremity edema. No clubbing or deformities. Neuro: Alert and oriented x 4   Skin: Warm and dry, no jaundice.   Psych: Alert and cooperative, normal mood and affect.  Labs:  Labs from July 2015 Hepatitis A antibody total reactive, hepatitis B surface antibody reactive, hepatitis B surface antigen nonreactive, hepatitis C antibody reactive, creatinine 0.82, calcium 10, hemoglobin A1c 5.5, total bilirubin 0.5, alkaline phosphatase 96, AST 59, ALT 65, albumin 4.3, TSH 2.3, white blood cell count 4900, hemoglobin 14, platelets 168,000.  Imaging Studies: No results found.

## 2014-01-06 NOTE — Patient Instructions (Signed)
1. Please have your labs done. 2. Read over the Hep C information provided and call with questions.  3. I will discuss further management of your weight loss and abdominal pain with Dr. Oneida Alar.   Hepatitis C Hepatitis C is a viral infection of the liver. Infection may go undetected for months or years because symptoms may be absent or very mild. Chronic liver disease is the main danger of hepatitis C. This may lead to scarring of the liver (cirrhosis), liver failure, and liver cancer. CAUSES  Hepatitis C is caused by the hepatitis C virus (HCV). Formerly, hepatitis C infections were most commonly transmitted through blood transfusions. In the early 1990s, routine testing of donated blood for hepatitis C and exclusion of blood that tests positive for HCV began. Now, HCV is most commonly transmitted from person to person through injection drug use, sharing needles, or sex with an infected person. A caregiver may also get the infection from exposure to the blood of an infected patient by way of a cut or needle stick.  SYMPTOMS  Acute Phase Many cases of acute HCV infection are mild and cause few problems.Some people may not even realize they are sick.Symptoms in others may last a few weeks to several months and include:  Feeling very tired.  Loss of appetite.  Nausea.  Vomiting.  Abdominal pain.  Dark yellow urine.  Yellow skin and eyes (jaundice).  Itching of the skin. Chronic Phase  Between 50% to 85% of people who get HCV infection become "chronic carriers." They often have no symptoms, but the virus stays in their body.They may spread the virus to others and can get long-term liver disease.  Many people with chronic HCV infection remain healthy for many years. However, up to 1 in 5 chronically infected people may develop severe liver diseases including scarring of the liver (cirrhosis), liver failure, or liver cancer. DIAGNOSIS  Diagnosis of hepatitis C infection is made by  testing blood for the presence of hepatitis C viral particles called RNA. Other tests may also be done to measure the status of current liver function, exclude other liver problems, or assess liver damage. TREATMENT  Treatment with many antiviral drugs is available and recommended for some patients with chronic HCV infection. Drug treatment is generally considered appropriate for patients who:  Are 43 years of age or older.  Have a positive test for HCV particles in the blood.  Have a liver tissue sample (biopsy) that shows chronic hepatitis and significant scarring (fibrosis).  Do not have signs of liver failure.  Have acceptable blood test results that confirm the wellness of other body organs.  Are willing to be treated and conform to treatment requirements.  Have no other circumstances that would prevent treatment from being recommended (contraindications). All people who are offered and choose to receive drug treatment must understand that careful medical follow up for many months and even years is crucial in order to make successful care possible. The goal of drug treatment is to eliminate any evidence of HCV in the blood on a long-term basis. This is called a "sustained virologic response" or SVR. Achieving a SVR is associated with a decrease in the chance of life-threatening liver problems, need for a liver transplant, liver cancer rates, and liver-related complications. Successful treatment currently requires taking treatment drugs for at least 24 weeks and up to 72 weeks. An injected drug (interferon) given weekly and an oral antiviral medicine taken daily are usually prescribed. Side effects from these drugs  are common and some may be very serious. Your response to treatment must be carefully monitored by both you and your caregiver throughout the entire treatment period. PREVENTION There is no vaccine for hepatitis C. The only way to prevent the disease is to reduce the risk of  exposure to the virus.   Avoid sharing drug needles or personal items like toothbrushes, razors, and nail clippers with an infected person.  Healthcare workers need to avoid injuries and wear appropriate protective equipment such as gloves, gowns, and face masks when performing invasive medical or nursing procedures. HOME CARE INSTRUCTIONS  To avoid making your liver disease worse:  Strictly avoid drinking alcohol.  Carefully review all new prescriptions of medicines with your caregiver. Ask your caregiver which drugs you should avoid. The following drugs are toxic to the liver, and your caregiver may tell you to avoid them:  Isoniazid.  Methyldopa.  Acetaminophen.  Anabolic steroids (muscle-building drugs).  Erythromycin.  Oral contraceptives (birth control pills).  Check with your caregiver to make sure medicine you are currently taking will not be harmful.  Periodic blood tests may be required. Follow your caregiver's advice about when you should have blood tests.  Avoid a sexual relationship until advised otherwise by your caregiver.  Avoid activities that could expose other people to your blood. Examples include sharing a toothbrush, nail clippers, razors, and needles.  Bed rest is not necessary, but it may make you feel better. Recovery time is not related to the amount of rest you receive.  This infection is contagious. Follow your caregiver's instructions in order to avoid spread of the infection. SEEK IMMEDIATE MEDICAL CARE IF:  You have increasing fatigue or weakness.  You have an oral temperature above 102 F (38.9 C), not controlled by medicine.  You develop loss of appetite, nausea, or vomiting.  You develop jaundice.  You develop easy bruising or bleeding.  You develop any severe problems as a result of your treatment. MAKE SURE YOU:   Understand these instructions.  Will watch your condition.  Will get help right away if you are not doing well or  get worse. Document Released: 04/21/2000 Document Revised: 07/17/2011 Document Reviewed: 08/06/2013 [facility] Patient Information 2015 [place], [place]. This information is not intended to replace advice given to you by your health care provider. Make sure you discuss any questions you have with your health care provider.

## 2014-01-07 NOTE — Assessment & Plan Note (Addendum)
53 year old lady with history of chronic hepatitis C dating back at least to 2002 who presents for further management. She describes undergoing interferon and possible ribavirin therapy over 10 years ago while living in [place]. She did not complete therapy, stop medication on her own due to side effects. She reports she was offered therapy in 2010 when she was seen at the hepatitis clinic. She is interested in pursuing treatment at this time. However patient has had significant unintentional weight loss and intermittent abdominal pain which needs to be addressed prior to undergoing treatment. Describes severe abdominal pain at times. Severe heartburn symptoms. Discussed with patient and daughter, may consider upper endoscopy however I will discuss with Dr. Oneida Alar. CT abdomen/pelvis be another option as initial workup. Further recommendations to follow. In the meantime she'll update her labs that are necessary for starting hepatitis C treatment.

## 2014-01-07 NOTE — Progress Notes (Signed)
Cc to pcp °

## 2014-01-09 DIAGNOSIS — R109 Unspecified abdominal pain: Secondary | ICD-10-CM | POA: Diagnosis not present

## 2014-01-09 DIAGNOSIS — R634 Abnormal weight loss: Secondary | ICD-10-CM | POA: Diagnosis not present

## 2014-01-09 DIAGNOSIS — B182 Chronic viral hepatitis C: Secondary | ICD-10-CM | POA: Diagnosis not present

## 2014-01-10 LAB — PROTIME-INR
INR: 1.07 (ref <1.50)
PROTHROMBIN TIME: 13.9 s (ref 11.6–15.2)

## 2014-01-10 LAB — LITHIUM LEVEL: Lithium Lvl: <0.1 mEq/L — ABNORMAL LOW (ref 0.80–1.40)

## 2014-01-10 LAB — HIV ANTIBODY (ROUTINE TESTING W REFLEX): HIV 1&2 Ab, 4th Generation: NONREACTIVE

## 2014-01-13 LAB — HCV RNA QUANT RFLX ULTRA OR GENOTYP
HCV QUANT: 284408 [IU]/mL — AB (ref <15)
HCV Quantitative Log: 5.45 {Log} — ABNORMAL HIGH (ref <1.18)

## 2014-01-14 NOTE — Progress Notes (Signed)
REVIEWED. CT ABD/PELVIS FOLLOWED BY EGD WITH PHENERGAN 12.5 MG IV ON PREOP. REFER PT FOR HCV RX DUE TO PRIOR TREATMENT FAILURE.

## 2014-01-16 LAB — HEPATITIS C GENOTYPE

## 2014-01-28 NOTE — Progress Notes (Signed)
Quick Note:  Please let patient know her labs show active HCV.  I discussed case with Dr. Oneida Alar.   1. She recommends CT A/P with contrast for dx: right sided abd pain, unintentional weight loss, H/O chronic HCV. Her last Creatinine was 0.82 on 11/21/13 2. After CT results, we will CONSIDER EGD (do not schedule yet). 3. Per Dr. Oneida Alar, she recommends referral to [facility] for Hepatitis C treatment since she failed therapy in the past. ______

## 2014-01-28 NOTE — Progress Notes (Signed)
Quick Note:  Called and informed pt. Ok to do CT and refer to [facility]. ______

## 2014-01-29 ENCOUNTER — Other Ambulatory Visit: Payer: Self-pay | Admitting: General Practice

## 2014-01-29 DIAGNOSIS — R634 Abnormal weight loss: Secondary | ICD-10-CM

## 2014-01-29 DIAGNOSIS — R109 Unspecified abdominal pain: Secondary | ICD-10-CM

## 2014-01-29 DIAGNOSIS — B182 Chronic viral hepatitis C: Secondary | ICD-10-CM

## 2014-01-29 NOTE — Progress Notes (Signed)
Quick Note:  Pt is aware of ct scan appt and to pick up her contrast the day before. ______

## 2014-02-04 ENCOUNTER — Other Ambulatory Visit: Payer: Self-pay

## 2014-02-04 ENCOUNTER — Ambulatory Visit (HOSPITAL_COMMUNITY)
Admission: RE | Admit: 2014-02-04 | Discharge: 2014-02-04 | Disposition: A | Discharge status: Home / Self Care | Payer: Medicare Other | Source: Ambulatory Visit | Attending: Gastroenterology | Admitting: Gastroenterology

## 2014-02-04 DIAGNOSIS — I700 Atherosclerosis of aorta: Secondary | ICD-10-CM | POA: Diagnosis not present

## 2014-02-04 DIAGNOSIS — R1011 Right upper quadrant pain: Secondary | ICD-10-CM | POA: Diagnosis not present

## 2014-02-04 DIAGNOSIS — R109 Unspecified abdominal pain: Secondary | ICD-10-CM

## 2014-02-04 DIAGNOSIS — R634 Abnormal weight loss: Secondary | ICD-10-CM | POA: Insufficient documentation

## 2014-02-04 DIAGNOSIS — B182 Chronic viral hepatitis C: Secondary | ICD-10-CM

## 2014-02-04 DIAGNOSIS — K759 Inflammatory liver disease, unspecified: Secondary | ICD-10-CM | POA: Diagnosis not present

## 2014-02-04 DIAGNOSIS — Z8619 Personal history of other infectious and parasitic diseases: Secondary | ICD-10-CM | POA: Diagnosis not present

## 2014-02-04 MED ORDER — IOHEXOL 300 MG/ML  SOLN
100.0000 mL | Freq: Once | INTRAMUSCULAR | Status: AC | PRN
  Administered 2014-02-04: 100 mL via INTRAVENOUS

## 2014-02-04 NOTE — Progress Notes (Signed)
Quick Note:  LMOM to call. ______ 

## 2014-02-04 NOTE — Progress Notes (Signed)
Quick Note:  Pt returned call and was informed. She said she can do the EGD Friday and I told her that Ginger will schedule and give her a call. ______

## 2014-02-04 NOTE — Progress Notes (Signed)
Quick Note:  No evidence of advanced liver disease. Nothing to explain weight loss or abdominal pain.  Continue with plans for HCV referral as before.  Needs EGD with Dr. Oneida Alar for abd pain, wt loss. Please schedule. ______

## 2014-02-05 ENCOUNTER — Encounter (HOSPITAL_COMMUNITY): Payer: Self-pay | Admitting: Pharmacy Technician

## 2014-02-06 ENCOUNTER — Encounter (HOSPITAL_COMMUNITY)
Admission: RE | Disposition: A | Discharge status: Home / Self Care | Payer: Self-pay | Source: Ambulatory Visit | Attending: Gastroenterology

## 2014-02-06 ENCOUNTER — Encounter (HOSPITAL_COMMUNITY): Payer: Self-pay

## 2014-02-06 ENCOUNTER — Ambulatory Visit (HOSPITAL_COMMUNITY)
Admission: RE | Admit: 2014-02-06 | Discharge: 2014-02-06 | Disposition: A | Discharge status: Home / Self Care | Payer: Medicare Other | Source: Ambulatory Visit | Attending: Gastroenterology | Admitting: Gastroenterology

## 2014-02-06 DIAGNOSIS — F319 Bipolar disorder, unspecified: Secondary | ICD-10-CM | POA: Insufficient documentation

## 2014-02-06 DIAGNOSIS — Z79899 Other long term (current) drug therapy: Secondary | ICD-10-CM | POA: Insufficient documentation

## 2014-02-06 DIAGNOSIS — R634 Abnormal weight loss: Secondary | ICD-10-CM | POA: Diagnosis not present

## 2014-02-06 DIAGNOSIS — K2980 Duodenitis without bleeding: Secondary | ICD-10-CM | POA: Insufficient documentation

## 2014-02-06 DIAGNOSIS — K319 Disease of stomach and duodenum, unspecified: Secondary | ICD-10-CM | POA: Insufficient documentation

## 2014-02-06 DIAGNOSIS — F17210 Nicotine dependence, cigarettes, uncomplicated: Secondary | ICD-10-CM | POA: Diagnosis not present

## 2014-02-06 DIAGNOSIS — E785 Hyperlipidemia, unspecified: Secondary | ICD-10-CM | POA: Insufficient documentation

## 2014-02-06 DIAGNOSIS — R1013 Epigastric pain: Secondary | ICD-10-CM | POA: Diagnosis present

## 2014-02-06 DIAGNOSIS — K30 Functional dyspepsia: Secondary | ICD-10-CM | POA: Diagnosis not present

## 2014-02-06 DIAGNOSIS — K2960 Other gastritis without bleeding: Secondary | ICD-10-CM | POA: Insufficient documentation

## 2014-02-06 DIAGNOSIS — R109 Unspecified abdominal pain: Secondary | ICD-10-CM

## 2014-02-06 HISTORY — PX: ESOPHAGOGASTRODUODENOSCOPY: SHX5428

## 2014-02-06 SURGERY — EGD (ESOPHAGOGASTRODUODENOSCOPY)
Anesthesia: Moderate Sedation

## 2014-02-06 MED ORDER — MIDAZOLAM HCL 5 MG/5ML IJ SOLN
INTRAMUSCULAR | Status: AC
  Filled 2014-02-06: qty 10

## 2014-02-06 MED ORDER — PROMETHAZINE HCL 25 MG/ML IJ SOLN
INTRAMUSCULAR | Status: AC
  Filled 2014-02-06: qty 1

## 2014-02-06 MED ORDER — MIDAZOLAM HCL 5 MG/5ML IJ SOLN
INTRAMUSCULAR | Status: DC | PRN
  Administered 2014-02-06 (×2): 2 mg via INTRAVENOUS

## 2014-02-06 MED ORDER — MEPERIDINE HCL 100 MG/ML IJ SOLN
INTRAMUSCULAR | Status: AC
  Filled 2014-02-06: qty 2

## 2014-02-06 MED ORDER — MINERAL OIL PO OIL
TOPICAL_OIL | ORAL | Status: AC
  Filled 2014-02-06: qty 30

## 2014-02-06 MED ORDER — STERILE WATER FOR IRRIGATION IR SOLN
Status: DC | PRN
  Administered 2014-02-06: 10:00:00

## 2014-02-06 MED ORDER — LIDOCAINE VISCOUS 2 % MT SOLN
OROMUCOSAL | Status: DC | PRN
  Administered 2014-02-06: 3 mL via OROMUCOSAL

## 2014-02-06 MED ORDER — PROMETHAZINE HCL 25 MG/ML IJ SOLN
12.5000 mg | Freq: Once | INTRAMUSCULAR | Status: AC
  Administered 2014-02-06: 12.5 mg via INTRAVENOUS

## 2014-02-06 MED ORDER — LIDOCAINE VISCOUS 2 % MT SOLN
OROMUCOSAL | Status: AC
  Filled 2014-02-06: qty 15

## 2014-02-06 MED ORDER — SODIUM CHLORIDE 0.9 % IV SOLN
INTRAVENOUS | Status: DC
Start: 2014-02-06 — End: 2014-02-06
  Administered 2014-02-06: 09:00:00 via INTRAVENOUS

## 2014-02-06 MED ORDER — SODIUM CHLORIDE 0.9 % IJ SOLN
INTRAMUSCULAR | Status: AC
  Filled 2014-02-06: qty 10

## 2014-02-06 MED ORDER — MEPERIDINE HCL 100 MG/ML IJ SOLN
INTRAMUSCULAR | Status: DC | PRN
  Administered 2014-02-06: 50 mg via INTRAVENOUS
  Administered 2014-02-06: 25 mg via INTRAVENOUS

## 2014-02-06 NOTE — H&P (Signed)
  Primary Care Physician:  Rosita Fire, MD Primary Gastroenterologist:  Dr. Oneida Alar  Pre-Procedure History & Physical: HPI:  Brittany Lester is a 53 y.o. female here for DYSPEPSIA/WEIGHT LOSS.  Past Medical History  Diagnosis Date  . Medical history non-contributory   . Hepatitis C   . Hyperlipidemia   . Bipolar disorder   . Lupus     ???    Past Surgical History  Procedure Laterality Date  . C section  12/18/1993  . Fracture surgery Right     20 years ago  . Colonoscopy N/A 12/24/2013    RFF:MBWGYK mucosa in the terminal ileum/TWO colon polyps removed/ The LEFT colon IS redundant/Small internal hemorrhoids. tubular adenoma. next TCS 12/2018    Prior to Admission medications   Medication Sig Start Date End Date Taking? Authorizing Provider  rosuvastatin (CRESTOR) 20 MG tablet Take 20 mg by mouth daily.   Yes Historical Provider, MD  traMADol (ULTRAM) 50 MG tablet Take 100 mg by mouth 2 (two) times daily.    Yes Historical Provider, MD    Allergies as of 02/04/2014 - Review Complete 01/06/2014  Allergen Reaction Noted  . Erythromycin      Family History  Problem Relation Age of Onset  . Colon cancer Maternal Aunt     age 70  . Liver disease Neg Hx     History   Social History  . Marital Status: Single    Spouse Name: N/A    Number of Children: 2  . Years of Education: N/A   Occupational History  . Not on file.   Social History Main Topics  . Smoking status: Heavy Tobacco Smoker  . Smokeless tobacco: Not on file  . Alcohol Use: No     Comment: remote heavy etoh use  . Drug Use: No     Comment: H/O IVDU, quit 2006  . Sexual Activity: Not on file   Other Topics Concern  . Not on file   Social History Narrative  . No narrative on file    Review of Systems: See HPI, otherwise negative ROS   Physical Exam: BP 145/93  Pulse 76  Temp(Src) 97.8 F (36.6 C) (Oral)  Resp 22  Ht 5\' 3"  (1.6 m)  Wt 97 lb (43.999 kg)  BMI 17.19 kg/m2  SpO2  98% General:   Alert,  pleasant and cooperative in NAD Head:  Normocephalic and atraumatic. Neck:  Supple; Lungs:  Clear throughout to auscultation.    Heart:  Regular rate and rhythm. Abdomen:  Soft, nontender and nondistended. Normal bowel sounds, without guarding, and without rebound.   Neurologic:  Alert and  oriented x4;  grossly normal neurologically.  Impression/Plan:     DYSPEPSIA/WEIGHT LOSS  PLAN:  EGD TODAY

## 2014-02-06 NOTE — Discharge Instructions (Signed)
You have mild gastritis. I biopsied your stomach.   AVOID TRIGGERS FOR GASTRITIS. SEE INFO BELOW.  FOLLOW UP IN 4 MOS.  UPPER ENDOSCOPY AFTER CARE Read the instructions outlined below and refer to this sheet in the next week. These discharge instructions provide you with general information on caring for yourself after you leave the hospital. While your treatment has been planned according to the most current medical practices available, unavoidable complications occasionally occur. If you have any problems or questions after discharge, call DR. Aldrick Derrig, [phone].  ACTIVITY  You may resume your regular activity, but move at a slower pace for the next 24 hours.   Take frequent rest periods for the next 24 hours.   Walking will help get rid of the air and reduce the bloated feeling in your belly (abdomen).   No driving for 24 hours (because of the medicine (anesthesia) used during the test).   You may shower.   Do not sign any important legal documents or operate any machinery for 24 hours (because of the anesthesia used during the test).    NUTRITION  Drink plenty of fluids.   You may resume your normal diet as instructed by your doctor.   Begin with a light meal and progress to your normal diet. Heavy or fried foods are harder to digest and may make you feel sick to your stomach (nauseated).   Avoid alcoholic beverages for 24 hours or as instructed.    MEDICATIONS  You may resume your normal medications.   WHAT YOU CAN EXPECT TODAY  Some feelings of bloating in the abdomen.   Passage of more gas than usual.    IF YOU HAD A BIOPSY TAKEN DURING THE UPPER ENDOSCOPY:  Eat a soft diet IF YOU HAVE NAUSEA, BLOATING, ABDOMINAL PAIN, OR VOMITING.    FINDING OUT THE RESULTS OF YOUR TEST Not all test results are available during your visit. DR. Oneida Alar WILL CALL YOU WITHIN 7 DAYS OF YOUR PROCEDUE WITH YOUR RESULTS. Do not assume everything is normal if you have not  heard from DR. Henreitta Spittler IN ONE WEEK, CALL HER OFFICE AT [phone].  SEEK IMMEDIATE MEDICAL ATTENTION AND CALL THE OFFICE: [phone] IF:  You have more than a spotting of blood in your stool.   Your belly is swollen (abdominal distention).   You are nauseated or vomiting.   You have a temperature over 101F.   You have abdominal pain or discomfort that is severe or gets worse throughout the day.   Gastritis  Gastritis is an inflammation (the body's way of reacting to injury and/or infection) of the stomach. It is often caused by viral or bacterial (germ) infections. It can also be caused BY ASPIRIN, BC/GOODY POWDER'S, (IBUPROFEN) MOTRIN, OR ALEVE (NAPROXEN), chemicals (including alcohol), SPICY FOODS, and medications. This illness may be associated with generalized malaise (feeling tired, not well), UPPER ABDOMINAL STOMACH cramps, and fever. One common bacterial cause of gastritis is an organism known as H. Pylori. This can be treated with antibiotics.

## 2014-02-07 LAB — TSH: TSH: 1.52 u[IU]/mL (ref 0.350–4.500)

## 2014-02-07 NOTE — Op Note (Signed)
[facility] [address] [place], [address]   ENDOSCOPY PROCEDURE REPORT  PATIENT: Brittany, Lester  MR#: [id number] BIRTHDATE: [birth year] , 89  yrs. old GENDER: female  ENDOSCOPIST: Barney Drain, MD REFERRED HB:ZJIRCVEL Fanta, M.D.  PROCEDURE DATE: 02/06/2014 PROCEDURE:   EGD w/ biopsy  INDICATIONS:dyspepsia.   weight loss. PT DENIES HISTORY OF ANOREXIA OR BULEMIA. FEELS DEPRESSION AND ANXIETY ARE UNDER GOOD CONTROL. NO TSH IN 2015. MEDICATIONS: Promethazine (Phenergan) 12.5 mg IV, Demerol 75 mg IV, and Versed 4 mg IV TOPICAL ANESTHETIC:   Viscous Xylocaine ASA CLASS:  DESCRIPTION OF PROCEDURE:     Physical exam was performed.  Informed consent was obtained from the patient after explaining the benefits, risks, and alternatives to the procedure.  The patient was connected to the monitor and placed in the left lateral position.  Continuous oxygen was provided by nasal cannula and IV medicine administered through an indwelling cannula.  After administration of sedation, the patients esophagus was intubated and the [id number] ([id number])  endoscope was advanced under direct visualization to the second portion of the duodenum.  The scope was removed slowly by carefully examining the color, texture, anatomy, and integrity of the mucosa on the way out.  The patient was recovered in endoscopy and discharged home in satisfactory condition.   ESOPHAGUS: The mucosa of the esophagus appeared normal.   STOMACH: Mild erosive gastritis (inflammation) was found in the gastric antrum.  Multiple biopsies were performed using cold forceps. DUODENUM: The duodenal mucosa showed no abnormalities in the bulb and 2nd part of the duodenum.  Cold forceps biopsies were taken in the bulb and second portion. COMPLICATIONS: There were no immediate complications.  ENDOSCOPIC IMPRESSION: 1.   NO OBVIOUS REASON FOR WEIGHT LOSS IDENTIFIED 2.   DYSPEPSIA DUE TO MILD Erosive  gastritis  RECOMMENDATIONS: AWAIT BIOPSY. AVOID TRIGGERS FOR GASTRITIS. TSH TODAY FOLLOW UP IN 4 MOS  REPEAT EXAM: _______________________________ eSignedBarney Drain, MD Feb 14, 2014 7:50 AM     CPT CODES: ICD CODES:  The ICD and CPT codes recommended by this software are interpretations from the data that the clinical staff has captured with the software.  The verification of the translation of this report to the ICD and CPT codes and modifiers is the sole responsibility of the health care institution and practicing physician where this report was generated.  [place]. will not be held responsible for the validity of the ICD and CPT codes included on this report.  AMA assumes no liability for data contained or not contained herein. CPT is a Designer, television/film set of the [employer].

## 2014-02-09 ENCOUNTER — Encounter (HOSPITAL_COMMUNITY): Payer: Self-pay | Admitting: Gastroenterology

## 2014-02-25 ENCOUNTER — Telehealth: Payer: Self-pay | Admitting: Gastroenterology

## 2014-02-25 NOTE — Telephone Encounter (Signed)
Talked with her about everything. She understands that we are waiting to hear back from the Hep C clinic

## 2014-02-25 NOTE — Telephone Encounter (Signed)
Patient called today saying that she had a procedure (EGD) and blood work done about 3 weeks ago and hasn't heard back from anyone regarding her results. She also said that she hasn't heard from [place] about being referred out to a specialist.  Please advise and call patient back at [phone]

## 2014-03-02 NOTE — Telephone Encounter (Signed)
Pre Listera at the Hep C clinic they have the referral and the patient should have a appointment by this week. Pt is aware and I gave her the number to call Harriett Rush if she has not heard anything by Friday.

## 2014-03-03 MED ORDER — OMEPRAZOLE 20 MG PO CPDR: DELAYED_RELEASE_CAPSULE | ORAL | Status: DC

## 2014-03-03 NOTE — Telephone Encounter (Signed)
Pt is aware of results. 

## 2014-03-03 NOTE — Telephone Encounter (Signed)
Please call pt. HER stomach Bx showS gastritis/DUODENITIS, WHICH CAN CAUSE UPPER ABDOMINAL PAIN. She needs OMEPRAZOLE BID FOR 3 MOS THEN ONCE DAILY. HER THYROID TESTS IS NORMAL. OPV E30 GASTRITIS/DUODENITIS/WEIGHT LOSS IN JAN 2016.

## 2014-03-03 NOTE — Telephone Encounter (Signed)
ON RECALL LIST  °

## 2014-03-03 NOTE — Addendum Note (Signed)
Addended by: Danie Binder on: 03/03/2014 11:45 AM   Modules accepted: Orders

## 2014-03-09 DIAGNOSIS — B182 Chronic viral hepatitis C: Secondary | ICD-10-CM | POA: Diagnosis not present

## 2014-03-17 DIAGNOSIS — B182 Chronic viral hepatitis C: Secondary | ICD-10-CM | POA: Diagnosis not present

## 2014-04-27 ENCOUNTER — Encounter: Payer: Self-pay | Admitting: Gastroenterology

## 2014-04-28 DIAGNOSIS — B182 Chronic viral hepatitis C: Secondary | ICD-10-CM | POA: Diagnosis not present

## 2014-05-04 DIAGNOSIS — F319 Bipolar disorder, unspecified: Secondary | ICD-10-CM | POA: Diagnosis not present

## 2014-05-04 DIAGNOSIS — E784 Other hyperlipidemia: Secondary | ICD-10-CM | POA: Diagnosis not present

## 2014-05-04 DIAGNOSIS — B182 Chronic viral hepatitis C: Secondary | ICD-10-CM | POA: Diagnosis not present

## 2014-05-04 DIAGNOSIS — K219 Gastro-esophageal reflux disease without esophagitis: Secondary | ICD-10-CM | POA: Diagnosis not present

## 2014-06-08 ENCOUNTER — Encounter: Payer: Self-pay | Admitting: Gastroenterology

## 2014-06-16 DIAGNOSIS — R51 Headache: Secondary | ICD-10-CM | POA: Diagnosis not present

## 2014-06-16 DIAGNOSIS — B182 Chronic viral hepatitis C: Secondary | ICD-10-CM | POA: Diagnosis not present

## 2014-07-01 ENCOUNTER — Ambulatory Visit (HOSPITAL_COMMUNITY): Payer: Self-pay | Admitting: Psychiatry

## 2014-07-20 ENCOUNTER — Encounter: Payer: Self-pay | Admitting: Women's Health

## 2014-07-24 ENCOUNTER — Encounter: Payer: Self-pay | Admitting: Adult Health

## 2014-07-24 ENCOUNTER — Ambulatory Visit (INDEPENDENT_AMBULATORY_CARE_PROVIDER_SITE_OTHER): Payer: Medicare Other | Admitting: Adult Health

## 2014-07-24 ENCOUNTER — Other Ambulatory Visit (HOSPITAL_COMMUNITY)
Admission: RE | Admit: 2014-07-24 | Discharge: 2014-07-24 | Disposition: A | Discharge status: Home / Self Care | Payer: Medicare Other | Source: Ambulatory Visit | Attending: Adult Health | Admitting: Adult Health

## 2014-07-24 VITALS — BP 170/80 | HR 74 | Ht 62.25 in | Wt 89.0 lb

## 2014-07-24 DIAGNOSIS — Z124 Encounter for screening for malignant neoplasm of cervix: Secondary | ICD-10-CM

## 2014-07-24 DIAGNOSIS — Z113 Encounter for screening for infections with a predominantly sexual mode of transmission: Secondary | ICD-10-CM | POA: Diagnosis not present

## 2014-07-24 DIAGNOSIS — Z1151 Encounter for screening for human papillomavirus (HPV): Secondary | ICD-10-CM | POA: Insufficient documentation

## 2014-07-24 DIAGNOSIS — A499 Bacterial infection, unspecified: Secondary | ICD-10-CM | POA: Diagnosis not present

## 2014-07-24 DIAGNOSIS — N760 Acute vaginitis: Secondary | ICD-10-CM | POA: Diagnosis not present

## 2014-07-24 DIAGNOSIS — N952 Postmenopausal atrophic vaginitis: Secondary | ICD-10-CM | POA: Diagnosis not present

## 2014-07-24 DIAGNOSIS — N898 Other specified noninflammatory disorders of vagina: Secondary | ICD-10-CM

## 2014-07-24 DIAGNOSIS — B9689 Other specified bacterial agents as the cause of diseases classified elsewhere: Secondary | ICD-10-CM

## 2014-07-24 HISTORY — DX: Other specified noninflammatory disorders of vagina: N89.8

## 2014-07-24 HISTORY — DX: Postmenopausal atrophic vaginitis: N95.2

## 2014-07-24 HISTORY — DX: Other specified bacterial agents as the cause of diseases classified elsewhere: B96.89

## 2014-07-24 LAB — POCT WET PREP (WET MOUNT): WBC WET PREP: POSITIVE

## 2014-07-24 MED ORDER — METRONIDAZOLE 500 MG PO TABS: 500.0000 mg | ORAL_TABLET | Freq: Two times a day (BID) | ORAL | Status: DC

## 2014-07-24 NOTE — Patient Instructions (Signed)
Atrophic Vaginitis Atrophic vaginitis is a problem of low levels of estrogen in women. This problem can happen at any age. It is most common in women who have gone through menopause ("the change").  HOW WILL I KNOW IF I HAVE THIS PROBLEM? You may have:  Trouble with peeing (urinating), such as:  Going to the bathroom often.  A hard time holding your pee until you reach a bathroom.  Leaking pee.  Having pain when you pee.  Itching or a burning feeling.  Vaginal bleeding and spotting.  Pain during sex.  Dryness of the vagina.  A yellow, bad-smelling fluid (discharge) coming from the vagina. HOW WILL MY DOCTOR CHECK FOR THIS PROBLEM?  During your exam, your doctor will likely find the problem.  If there is a vaginal fluid, it may be checked for infection. HOW WILL THIS PROBLEM BE TREATED? Keep the vulvar skin as clean as possible. Moisturizers and lubricants can help with some of the symptoms. Estrogen replacement can help. There are 2 ways to take estrogen:  Systemic estrogen gets estrogen to your whole body. It takes many weeks or months before the symptoms get better.  You take an estrogen pill.  You use a skin patch. This is a patch that you put on your skin.  If you still have your uterus, your doctor may ask you to take a hormone. Talk to your doctor about the right medicine for you.  Estrogen cream.  This puts estrogen only at the part of your body where you apply it. The cream is put into the vagina or put on the vulvar skin. For some women, estrogen cream works faster than pills or the patch. CAN ALL WOMEN WITH THIS PROBLEM USE ESTROGEN? No. Women with certain types of cancer, liver problems, or problems with blood clots should not take estrogen. Your doctor can help you decide the best treatment for your symptoms. Document Released: 10/11/2007 Document Revised: 04/29/2013 Document Reviewed: 10/11/2007 [facility] Patient Information 2015 [place], [place]. This  information is not intended to replace advice given to you by your health care provider. Make sure you discuss any questions you have with your health care provider. Bacterial Vaginosis Bacterial vaginosis is a vaginal infection that occurs when the normal balance of bacteria in the vagina is disrupted. It results from an overgrowth of certain bacteria. This is the most common vaginal infection in women of childbearing age. Treatment is important to prevent complications, especially in pregnant women, as it can cause a premature delivery. CAUSES  Bacterial vaginosis is caused by an increase in harmful bacteria that are normally present in smaller amounts in the vagina. Several different kinds of bacteria can cause bacterial vaginosis. However, the reason that the condition develops is not fully understood. RISK FACTORS Certain activities or behaviors can put you at an increased risk of developing bacterial vaginosis, including:  Having a new sex partner or multiple sex partners.  Douching.  Using an intrauterine device (IUD) for contraception. Women do not get bacterial vaginosis from toilet seats, bedding, swimming pools, or contact with objects around them. SIGNS AND SYMPTOMS  Some women with bacterial vaginosis have no signs or symptoms. Common symptoms include:  Grey vaginal discharge.  A fishlike odor with discharge, especially after sexual intercourse.  Itching or burning of the vagina and vulva.  Burning or pain with urination. DIAGNOSIS  Your health care provider will take a medical history and examine the vagina for signs of bacterial vaginosis. A sample of vaginal fluid may  be taken. Your health care provider will look at this sample under a microscope to check for bacteria and abnormal cells. A vaginal pH test may also be done.  TREATMENT  Bacterial vaginosis may be treated with antibiotic medicines. These may be given in the form of a pill or a vaginal cream. A second round of  antibiotics may be prescribed if the condition comes back after treatment.  HOME CARE INSTRUCTIONS   Only take over-the-counter or prescription medicines as directed by your health care provider.  If antibiotic medicine was prescribed, take it as directed. Make sure you finish it even if you start to feel better.  Do not have sex until treatment is completed.  Tell all sexual partners that you have a vaginal infection. They should see their health care provider and be treated if they have problems, such as a mild rash or itching.  Practice safe sex by using condoms and only having one sex partner. SEEK MEDICAL CARE IF:   Your symptoms are not improving after 3 days of treatment.  You have increased discharge or pain.  You have a fever. MAKE SURE YOU:   Understand these instructions.  Will watch your condition.  Will get help right away if you are not doing well or get worse. FOR MORE INFORMATION  Centers for Disease Control and Prevention, Division of STD Prevention: [employer] American Sexual Health Association (ASHA): www.ashastd.org  Document Released: 04/24/2005 Document Revised: 02/12/2013 Document Reviewed: 12/04/2012 [facility] Patient Information 2015 [place], [place]. This information is not intended to replace advice given to you by your health care provider. Make sure you discuss any questions you have with your health care provider. Take flagyl  No alcohol No sex Recheck in 12 days

## 2014-07-24 NOTE — Progress Notes (Signed)
Subjective:     Patient ID: Brittany Lester, female   DOB: 1961/04/07, 54 y.o.   MRN: [id number]  HPI Brittany Lester is a 54 year old white female, new to this office, in complaining of vaginal discharge for last 3 weeks, has not noticed odor, it started off white then yellow to green and now brown, had sex with ex husband.She has history of herpes.And is being treated for hepatitis C.Her PCP is Dr Legrand Rams and see sees Dr Oneida Alar and [facility] liver care.Ha shad weight loss, unexplained.  Review of Systems + vaginal discharge, see HPI. Reviewed past medical,surgical, social and family history. Reviewed medications and allergies.     Objective:   Physical Exam BP 170/80 mmHg  Pulse 74  Ht 5' 2.25" (1.581 m)  Wt 89 lb (40.37 kg)  BMI 16.15 kg/m2   Skin warm and dry.Pelvic: external genitalia is normal in appearance no lesions, vagina: white discharge with slight odor,tissues atrophic and red, cervix:smooth and atrophic, with strawberry appearance, uterus is felt to be normal size, shape and contour, non tender, no masses felt, adnexa no masses or tenderness noted, pap performed with HPV and GC/CHL and cervix friable, wet prep showed + WBCs and + clue cells.Bladder non tender, no masses and urethra was red with strawberry appearance.  Assessment:     Vaginal discharge BV Atrophic vaginitis    Plan:    Pap sent with HPV and GC/CHL Rx flagyl 500 mg 1 bid x 7 days, no alcohol, review handout on BV   No sex Recheck in 12 days, may add estrogen cream then

## 2014-07-27 LAB — CYTOLOGY - PAP

## 2014-07-31 DIAGNOSIS — B182 Chronic viral hepatitis C: Secondary | ICD-10-CM | POA: Diagnosis not present

## 2014-08-03 ENCOUNTER — Encounter: Payer: Self-pay | Admitting: Gastroenterology

## 2014-08-03 ENCOUNTER — Ambulatory Visit (INDEPENDENT_AMBULATORY_CARE_PROVIDER_SITE_OTHER): Payer: Medicare Other | Admitting: Gastroenterology

## 2014-08-03 VITALS — BP 150/98 | HR 74 | Temp 97.0°F | Ht 63.0 in | Wt 91.2 lb

## 2014-08-03 DIAGNOSIS — R634 Abnormal weight loss: Secondary | ICD-10-CM

## 2014-08-03 DIAGNOSIS — B182 Chronic viral hepatitis C: Secondary | ICD-10-CM

## 2014-08-03 DIAGNOSIS — Z720 Tobacco use: Secondary | ICD-10-CM | POA: Diagnosis not present

## 2014-08-03 DIAGNOSIS — B1920 Unspecified viral hepatitis C without hepatic coma: Secondary | ICD-10-CM | POA: Insufficient documentation

## 2014-08-03 DIAGNOSIS — K5900 Constipation, unspecified: Secondary | ICD-10-CM | POA: Diagnosis not present

## 2014-08-03 DIAGNOSIS — F17200 Nicotine dependence, unspecified, uncomplicated: Secondary | ICD-10-CM | POA: Insufficient documentation

## 2014-08-03 MED ORDER — LUBIPROSTONE 8 MCG PO CAPS: 8.0000 ug | ORAL_CAPSULE | Freq: Two times a day (BID) | ORAL | Status: DC

## 2014-08-03 NOTE — Progress Notes (Signed)
Primary Care Physician: Rosita Fire, MD  Primary Gastroenterologist:  Barney Drain, MD   Chief Complaint  Patient presents with  . Follow-up    HPI: Brittany Lester is a 54 y.o. female here for follow up. She had screening colonoscopy last year and had multiple tubular adenomas. She will need follow up TCS in 12/2018 with overtube. She had EGD 02/2014 for weight loss. She had reactive gastropathy and chronic duodenitis. No H.pylori. She reports that she was on PPI BID for awhile. She believes she stopped it when she started Harvoni for chronic HCV. She just completed therapy last Friday. She is not sure of any of her lab work. Complicated by headache and hypertension (no previous diagnosis). She sees her PCP this week.  Patient has lost 8-10 pounds since we last saw her in October. 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 Eats a meal for breakfast. Generally consumes one boost or a glass of milk daily. Drinks water and coffee. Usually eats a full dinner and snacks during the night time. Denies any abdominal pain, cough, shortness of breath. Complains of lumbar back pain for several years, tramadol doesn't seem to be helping. Denies any previous workup. Denies dysuria hematuria, urinary hesitancy. Complains of constipation. At baseline only had BM every 2-3 days. Now stool are hard and she has to strain. No melena, brbpr.  She is a chronic smoker.  CT of the abdomen pelvis with contrast last September for weight loss showed scattered aortic and iliac atherosclerosis otherwise unremarkable study.  Current Outpatient Prescriptions  Medication Sig Dispense Refill  . traMADol (ULTRAM) 50 MG tablet Take 100 mg by mouth 2 (two) times daily.      No current facility-administered medications for this visit.    Allergies as of 08/03/2014 - Review Complete 08/03/2014  Allergen Reaction Noted  . Erythromycin     Past Medical History  Diagnosis Date  . Medical history non-contributory    . Hepatitis C   . Hyperlipidemia   . Bipolar disorder   . Lupus     ???  . Herpes   . Vaginal discharge 07/24/2014  . BV (bacterial vaginosis) 07/24/2014  . Atrophic vaginitis 07/24/2014   Past Surgical History  Procedure Laterality Date  . C section  12/18/1993  . Fracture surgery Right     20 years ago  . Colonoscopy N/A 12/24/2013    JQB:HALPFX mucosa in the terminal ileum/TWO colon polyps removed/ The LEFT colon IS redundant/Small internal hemorrhoids. tubular adenoma. next TCS 12/2018  . Esophagogastroduodenoscopy N/A 02/06/2014    SLF: 1. No obvious reason for weight loss identified 2. Dyspepsia due to MILD erosive gastritis.  . Cesarean section      ROS:  General: Negative for anorexia,  fever, chills, fatigue, weakness. See hpi ENT: Negative for hoarseness, difficulty swallowing , nasal congestion. CV: Negative for chest pain, angina, palpitations, dyspnea on exertion, peripheral edema.  Respiratory: Negative for dyspnea at rest, dyspnea on exertion, cough, sputum, wheezing.  GI: See history of present illness. GU:  Negative for dysuria, hematuria, urinary incontinence, urinary frequency, nocturnal urination.  Endo: see hpi.  Physical Examination:   BP 150/98 mmHg  Pulse 74  Temp(Src) 97 F (36.1 C)  Ht 5\' 3"  (1.6 m)  Wt 91 lb 3.2 oz (41.368 kg)  BMI 16.16 kg/m2  General: Well-nourished, well-developed in no acute distress.  Eyes: No icterus. Mouth: Oropharyngeal mucosa moist and pink , no lesions erythema or exudate. Lungs: Clear to  auscultation bilaterally.  Heart: Regular rate and rhythm, no murmurs rubs or gallops.  Abdomen: Bowel sounds are normal, nontender, nondistended, no hepatosplenomegaly or masses, no abdominal bruits or hernia , no rebound or guarding.   Extremities: No lower extremity edema. No clubbing or deformities. Neuro: Alert and oriented x 4   Skin: Warm and dry, no jaundice.   Psych: Alert and cooperative, normal mood and affect.  Labs:    Lab Results  Component Value Date   TSH 1.520 02/06/2014       Imaging Studies: No results found.

## 2014-08-03 NOTE — Assessment & Plan Note (Signed)
Ongoing weight loss as outlined. Patient to complete calorie count for five days and return to the office. Chest xray to rule out lung related issues contributing to weight loss.

## 2014-08-03 NOTE — Assessment & Plan Note (Signed)
Add Amitiza 81mcg BID with food. Samples provided. Call for RX if helps.

## 2014-08-03 NOTE — Assessment & Plan Note (Signed)
Patient just completed Harvoni. Continue follow up with [facility] as planned. Request records as available.   May consider restarting PPI therapy in the future but patient doesn't feel she needs it right now.

## 2014-08-03 NOTE — Patient Instructions (Addendum)
1. Please keep food journal writing down all calories you eat each day for five days. Include your calories from liquids.  2. Please have your chest xray done. 3. Amitiza 37mcg twice a day with food. Samples provided. Let me know if you need a prescription.

## 2014-08-03 NOTE — Progress Notes (Signed)
cc'ed to pcp °

## 2014-08-05 ENCOUNTER — Ambulatory Visit (INDEPENDENT_AMBULATORY_CARE_PROVIDER_SITE_OTHER): Payer: Medicare Other | Admitting: Adult Health

## 2014-08-05 ENCOUNTER — Ambulatory Visit (HOSPITAL_COMMUNITY)
Admission: RE | Admit: 2014-08-05 | Discharge: 2014-08-05 | Disposition: A | Discharge status: Home / Self Care | Payer: Medicare Other | Source: Ambulatory Visit | Attending: Gastroenterology | Admitting: Gastroenterology

## 2014-08-05 ENCOUNTER — Encounter: Payer: Self-pay | Admitting: Adult Health

## 2014-08-05 VITALS — BP 160/90 | HR 76 | Ht 63.0 in | Wt 90.5 lb

## 2014-08-05 DIAGNOSIS — R634 Abnormal weight loss: Secondary | ICD-10-CM | POA: Diagnosis not present

## 2014-08-05 DIAGNOSIS — Z87891 Personal history of nicotine dependence: Secondary | ICD-10-CM | POA: Diagnosis not present

## 2014-08-05 DIAGNOSIS — J8410 Pulmonary fibrosis, unspecified: Secondary | ICD-10-CM | POA: Diagnosis not present

## 2014-08-05 DIAGNOSIS — N898 Other specified noninflammatory disorders of vagina: Secondary | ICD-10-CM | POA: Diagnosis not present

## 2014-08-05 NOTE — Patient Instructions (Signed)
Follow up prn

## 2014-08-05 NOTE — Progress Notes (Signed)
Subjective:     Patient ID: Brittany Lester, female   DOB: 1961/05/08, 54 y.o.   MRN: [id number]  HPI [name] is a 54 year old white female in for recheck of BV. She says is better, but had constipation while taking flagyl and has hemorrhoids.Has seen [facility].  Review of Systems +vaginal discharge resolving Reviewed past medical,surgical, social and family history. Reviewed medications and allergies.     Objective:   Physical Exam BP 160/90 mmHg  Pulse 76  Ht 5\' 3"  (1.6 m)  Wt 90 lb 8 oz (41.051 kg)  BMI 16.04 kg/m2BP was 150/92 on arrival.   Skin warm and dry.Pelvic: external genitalia is normal in appearance no lesions, vagina: scant discharge without odor, atrophic changes,urethra has no lesions or masses noted, cervix:smooth, uterus: normal size, shape and contour, non tender, no masses felt, adnexa: no masses or tenderness noted. Bladder is non tender and no masses felt.She declines estrogen cream at present but wil call if wants to try.Has had elevated BP while on Harvoni.  Assessment:     Vaginal discharge     Plan:    See PCP in am and recheck BP then  Follow up prn

## 2014-08-06 DIAGNOSIS — I10 Essential (primary) hypertension: Secondary | ICD-10-CM | POA: Diagnosis not present

## 2014-08-06 DIAGNOSIS — E785 Hyperlipidemia, unspecified: Secondary | ICD-10-CM | POA: Diagnosis not present

## 2014-08-06 DIAGNOSIS — E784 Other hyperlipidemia: Secondary | ICD-10-CM | POA: Diagnosis not present

## 2014-08-06 DIAGNOSIS — F17200 Nicotine dependence, unspecified, uncomplicated: Secondary | ICD-10-CM | POA: Diagnosis not present

## 2014-08-06 DIAGNOSIS — K219 Gastro-esophageal reflux disease without esophagitis: Secondary | ICD-10-CM | POA: Diagnosis not present

## 2014-08-10 DIAGNOSIS — B182 Chronic viral hepatitis C: Secondary | ICD-10-CM | POA: Diagnosis not present

## 2014-08-11 NOTE — Progress Notes (Signed)
Quick Note:  LMOM to call. ______ 

## 2014-08-11 NOTE — Progress Notes (Signed)
Quick Note:  Nothing to explain weight loss. Please remind patient to complete calorie count and return to me. ______

## 2014-09-04 ENCOUNTER — Encounter (HOSPITAL_COMMUNITY): Payer: Self-pay | Admitting: Psychiatry

## 2014-09-04 ENCOUNTER — Ambulatory Visit (INDEPENDENT_AMBULATORY_CARE_PROVIDER_SITE_OTHER): Payer: Medicare Other | Admitting: Psychiatry

## 2014-09-04 VITALS — BP 114/74 | HR 75 | Ht 63.0 in | Wt 86.6 lb

## 2014-09-04 DIAGNOSIS — F39 Unspecified mood [affective] disorder: Secondary | ICD-10-CM

## 2014-09-04 DIAGNOSIS — F1994 Other psychoactive substance use, unspecified with psychoactive substance-induced mood disorder: Secondary | ICD-10-CM

## 2014-09-04 MED ORDER — MIRTAZAPINE 15 MG PO TABS: 15.0000 mg | ORAL_TABLET | Freq: Every day | ORAL | Status: DC
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
## 2014-09-29 ENCOUNTER — Ambulatory Visit (INDEPENDENT_AMBULATORY_CARE_PROVIDER_SITE_OTHER): Payer: Medicare Other | Admitting: Psychology

## 2014-09-29 ENCOUNTER — Encounter (HOSPITAL_COMMUNITY): Payer: Self-pay | Admitting: Psychology

## 2014-09-29 DIAGNOSIS — F39 Unspecified mood [affective] disorder: Secondary | ICD-10-CM | POA: Diagnosis not present

## 2014-09-29 DIAGNOSIS — F0630 Mood disorder due to known physiological condition, unspecified: Secondary | ICD-10-CM
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
## 2014-10-14 ENCOUNTER — Telehealth (HOSPITAL_COMMUNITY): Payer: Self-pay | Admitting: *Deleted

## 2014-10-16 ENCOUNTER — Encounter (HOSPITAL_COMMUNITY): Payer: Self-pay | Admitting: Psychiatry

## 2014-10-16 ENCOUNTER — Ambulatory Visit (INDEPENDENT_AMBULATORY_CARE_PROVIDER_SITE_OTHER): Payer: Medicare Other | Admitting: Psychiatry

## 2014-10-16 VITALS — BP 118/76 | HR 82 | Ht 63.0 in | Wt 86.2 lb

## 2014-10-16 DIAGNOSIS — F0630 Mood disorder due to known physiological condition, unspecified: Secondary | ICD-10-CM

## 2014-10-16 DIAGNOSIS — F1994 Other psychoactive substance use, unspecified with psychoactive substance-induced mood disorder: Secondary | ICD-10-CM | POA: Diagnosis not present

## 2014-10-16 DIAGNOSIS — F39 Unspecified mood [affective] disorder: Secondary | ICD-10-CM

## 2014-10-16 MED ORDER — OLANZAPINE 10 MG PO TABS: 10.0000 mg | ORAL_TABLET | Freq: Every day | ORAL | Status: DC

## 2014-10-16 MED ORDER — DULOXETINE HCL 60 MG PO CPEP: 60.0000 mg | ORAL_CAPSULE | Freq: Every day | ORAL | Status: DC
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
## 2014-10-20 ENCOUNTER — Ambulatory Visit (HOSPITAL_COMMUNITY): Payer: Self-pay | Admitting: Psychology

## 2014-11-13 ENCOUNTER — Telehealth (HOSPITAL_COMMUNITY): Payer: Self-pay | Admitting: *Deleted

## 2014-11-13 ENCOUNTER — Ambulatory Visit (INDEPENDENT_AMBULATORY_CARE_PROVIDER_SITE_OTHER): Payer: Medicare Other | Admitting: Psychiatry

## 2014-11-13 ENCOUNTER — Encounter (HOSPITAL_COMMUNITY): Payer: Self-pay | Admitting: Psychiatry

## 2014-11-13 VITALS — BP 113/67 | HR 71 | Ht 63.0 in | Wt 86.0 lb

## 2014-11-13 DIAGNOSIS — F1994 Other psychoactive substance use, unspecified with psychoactive substance-induced mood disorder: Secondary | ICD-10-CM | POA: Diagnosis not present

## 2014-11-13 DIAGNOSIS — F39 Unspecified mood [affective] disorder: Secondary | ICD-10-CM

## 2014-11-13 DIAGNOSIS — F0630 Mood disorder due to known physiological condition, unspecified: Secondary | ICD-10-CM

## 2014-11-13 MED ORDER — OLANZAPINE 10 MG PO TABS: 10.0000 mg | ORAL_TABLET | Freq: Every day | ORAL | Status: DC
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
## 2014-11-13 NOTE — Telephone Encounter (Signed)
NP state viral count is 0 and liver is ok. This is not the cause of weight loss. I informed pt and suggested she return to PCP for further work-up

## 2014-11-13 NOTE — Telephone Encounter (Signed)
voice message from NP at [facility].   Patient completed 12 weeks of treatment, she had more recent labs, but she don't have access to.    please call NP so she can discuss this further.

## 2014-11-13 NOTE — Telephone Encounter (Signed)
noted 

## 2014-12-18 ENCOUNTER — Ambulatory Visit (INDEPENDENT_AMBULATORY_CARE_PROVIDER_SITE_OTHER): Payer: Medicare Other | Admitting: Psychology

## 2014-12-18 DIAGNOSIS — F39 Unspecified mood [affective] disorder: Secondary | ICD-10-CM

## 2014-12-18 DIAGNOSIS — F0630 Mood disorder due to known physiological condition, unspecified: Secondary | ICD-10-CM
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
## 2014-12-22 DIAGNOSIS — F17200 Nicotine dependence, unspecified, uncomplicated: Secondary | ICD-10-CM | POA: Diagnosis not present

## 2014-12-22 DIAGNOSIS — F319 Bipolar disorder, unspecified: Secondary | ICD-10-CM | POA: Diagnosis not present

## 2014-12-22 DIAGNOSIS — I10 Essential (primary) hypertension: Secondary | ICD-10-CM | POA: Diagnosis not present

## 2014-12-22 DIAGNOSIS — E780 Pure hypercholesterolemia: Secondary | ICD-10-CM | POA: Diagnosis not present

## 2014-12-22 DIAGNOSIS — E784 Other hyperlipidemia: Secondary | ICD-10-CM | POA: Diagnosis not present

## 2014-12-22 DIAGNOSIS — K219 Gastro-esophageal reflux disease without esophagitis: Secondary | ICD-10-CM | POA: Diagnosis not present

## 2015-01-14 ENCOUNTER — Encounter (HOSPITAL_COMMUNITY): Payer: Self-pay | Admitting: Psychiatry

## 2015-01-14 ENCOUNTER — Ambulatory Visit (INDEPENDENT_AMBULATORY_CARE_PROVIDER_SITE_OTHER): Payer: Medicare Other | Admitting: Psychiatry

## 2015-01-14 VITALS — BP 123/91 | HR 76 | Ht 63.0 in | Wt 85.8 lb

## 2015-01-14 DIAGNOSIS — F39 Unspecified mood [affective] disorder: Secondary | ICD-10-CM

## 2015-01-14 DIAGNOSIS — F1994 Other psychoactive substance use, unspecified with psychoactive substance-induced mood disorder: Secondary | ICD-10-CM | POA: Diagnosis not present

## 2015-01-14 DIAGNOSIS — F0630 Mood disorder due to known physiological condition, unspecified: Secondary | ICD-10-CM

## 2015-01-14 MED ORDER — CLONAZEPAM 0.5 MG PO TABS: 0.5000 mg | ORAL_TABLET | Freq: Three times a day (TID) | ORAL | Status: DC | PRN

## 2015-01-14 MED ORDER — PAROXETINE HCL 20 MG PO TABS: 20.0000 mg | ORAL_TABLET | Freq: Every day | ORAL | Status: DC
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
## 2015-01-15 ENCOUNTER — Ambulatory Visit (INDEPENDENT_AMBULATORY_CARE_PROVIDER_SITE_OTHER): Payer: Medicare Other | Admitting: Psychology

## 2015-01-15 ENCOUNTER — Encounter (HOSPITAL_COMMUNITY): Payer: Self-pay | Admitting: Psychology

## 2015-01-15 DIAGNOSIS — B182 Chronic viral hepatitis C: Secondary | ICD-10-CM | POA: Diagnosis not present

## 2015-01-15 DIAGNOSIS — F39 Unspecified mood [affective] disorder: Secondary | ICD-10-CM | POA: Diagnosis not present

## 2015-01-15 DIAGNOSIS — F0630 Mood disorder due to known physiological condition, unspecified: Secondary | ICD-10-CM
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
## 2015-01-21 ENCOUNTER — Other Ambulatory Visit (HOSPITAL_COMMUNITY): Payer: Self-pay | Admitting: Psychiatry

## 2015-01-25 ENCOUNTER — Telehealth (HOSPITAL_COMMUNITY): Payer: Self-pay | Admitting: *Deleted

## 2015-01-25 DIAGNOSIS — B182 Chronic viral hepatitis C: Secondary | ICD-10-CM | POA: Diagnosis not present

## 2015-01-25 NOTE — Telephone Encounter (Signed)
VOICE MESSAGE FROM [facility], REFILL REQUEST FOR CYMBALTA 60 MG.

## 2015-01-28 NOTE — Telephone Encounter (Signed)
Called pt and daughter picked up call and stated she will inform pt to call back

## 2015-02-01 NOTE — Telephone Encounter (Signed)
Spoke with pt and she stated that she is not taking Cymbalta and she stated that her pharmacy have her on auto refill for it and she went to them pharmacy to inform for them to take her off the auto refill for that medication

## 2015-02-03 ENCOUNTER — Ambulatory Visit (INDEPENDENT_AMBULATORY_CARE_PROVIDER_SITE_OTHER): Payer: Medicare Other | Admitting: Psychology

## 2015-02-03 ENCOUNTER — Encounter (HOSPITAL_COMMUNITY): Payer: Self-pay | Admitting: Psychology

## 2015-02-03 DIAGNOSIS — F39 Unspecified mood [affective] disorder: Secondary | ICD-10-CM | POA: Diagnosis not present

## 2015-02-03 DIAGNOSIS — F0630 Mood disorder due to known physiological condition, unspecified: Secondary | ICD-10-CM
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
## 2015-02-05 ENCOUNTER — Ambulatory Visit (HOSPITAL_COMMUNITY): Payer: Self-pay | Admitting: Psychology

## 2015-02-11 ENCOUNTER — Encounter (HOSPITAL_COMMUNITY): Payer: Self-pay | Admitting: Psychiatry

## 2015-02-11 ENCOUNTER — Ambulatory Visit (INDEPENDENT_AMBULATORY_CARE_PROVIDER_SITE_OTHER): Payer: Medicare Other | Admitting: Psychiatry

## 2015-02-11 VITALS — BP 127/90 | HR 86 | Ht 63.0 in | Wt 84.4 lb

## 2015-02-11 DIAGNOSIS — F0630 Mood disorder due to known physiological condition, unspecified: Secondary | ICD-10-CM

## 2015-02-11 DIAGNOSIS — F1994 Other psychoactive substance use, unspecified with psychoactive substance-induced mood disorder: Secondary | ICD-10-CM

## 2015-02-11 MED ORDER — BUSPIRONE HCL 5 MG PO TABS: 5.0000 mg | ORAL_TABLET | Freq: Three times a day (TID) | ORAL | Status: DC
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
## 2015-02-24 ENCOUNTER — Ambulatory Visit (HOSPITAL_COMMUNITY): Payer: Self-pay | Admitting: Psychology

## 2015-02-26 ENCOUNTER — Telehealth: Payer: Self-pay | Admitting: Gastroenterology

## 2015-02-26 NOTE — Telephone Encounter (Signed)
Patient needs follow up ov regarding weight loss with [facility].

## 2015-03-01 ENCOUNTER — Encounter: Payer: Self-pay | Admitting: Gastroenterology

## 2015-03-01 NOTE — Telephone Encounter (Signed)
APPT MADE AND LETTER SENT  °

## 2015-03-02 ENCOUNTER — Telehealth (HOSPITAL_COMMUNITY): Payer: Self-pay | Admitting: *Deleted

## 2015-03-11 ENCOUNTER — Ambulatory Visit (INDEPENDENT_AMBULATORY_CARE_PROVIDER_SITE_OTHER): Payer: Medicare Other | Admitting: Psychiatry

## 2015-03-11 ENCOUNTER — Encounter (HOSPITAL_COMMUNITY): Payer: Self-pay | Admitting: Psychiatry

## 2015-03-11 VITALS — BP 130/84 | HR 84 | Ht 63.0 in | Wt 84.6 lb

## 2015-03-11 DIAGNOSIS — F1994 Other psychoactive substance use, unspecified with psychoactive substance-induced mood disorder: Secondary | ICD-10-CM

## 2015-03-11 DIAGNOSIS — F0630 Mood disorder due to known physiological condition, unspecified: Secondary | ICD-10-CM

## 2015-03-11 MED ORDER — PAROXETINE HCL 40 MG PO TABS: 40.0000 mg | ORAL_TABLET | Freq: Every day | ORAL | Status: DC

## 2015-03-11 MED ORDER — BUSPIRONE HCL 5 MG PO TABS: 5.0000 mg | ORAL_TABLET | Freq: Three times a day (TID) | ORAL | Status: DC

## 2015-03-11 MED ORDER — CLONAZEPAM 0.5 MG PO TABS: 0.5000 mg | ORAL_TABLET | Freq: Three times a day (TID) | ORAL | Status: DC | PRN
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
## 2015-03-25 ENCOUNTER — Ambulatory Visit: Payer: Self-pay | Admitting: Gastroenterology

## 2015-04-05 ENCOUNTER — Ambulatory Visit: Payer: Self-pay | Admitting: Gastroenterology

## 2015-04-12 ENCOUNTER — Ambulatory Visit (HOSPITAL_COMMUNITY): Payer: Medicare Other | Admitting: Psychology

## 2015-04-20 ENCOUNTER — Ambulatory Visit (HOSPITAL_COMMUNITY): Payer: Self-pay | Admitting: Psychiatry

## 2015-04-22 ENCOUNTER — Telehealth (HOSPITAL_COMMUNITY): Payer: Self-pay | Admitting: *Deleted

## 2015-04-22 ENCOUNTER — Encounter (INDEPENDENT_AMBULATORY_CARE_PROVIDER_SITE_OTHER): Payer: Self-pay | Admitting: Gastroenterology

## 2015-04-22 ENCOUNTER — Encounter: Payer: Self-pay | Admitting: Gastroenterology

## 2015-04-22 ENCOUNTER — Telehealth: Payer: Self-pay | Admitting: Gastroenterology

## 2015-04-22 NOTE — Telephone Encounter (Signed)
PATIENT WAS A NO SHOW AND LETTER SENT  °

## 2015-04-22 NOTE — Progress Notes (Signed)
   Subjective:    Patient ID: Minda Ditto, female    DOB: 03-08-1961, 54 y.o.   MRN: [id number]  Rosita Fire, MD  HPI NO SHOW/NO CALL  Past Medical History  Diagnosis Date  . Medical history non-contributory   . Hepatitis C     Harvoni  . Hyperlipidemia   . Bipolar disorder ([place])   . Lupus (HCC)     ???  . Herpes   . Vaginal discharge 07/24/2014  . BV (bacterial vaginosis) 07/24/2014  . Atrophic vaginitis 07/24/2014  . Hepatitis C     Past Surgical History  Procedure Laterality Date  . C section  12/18/1993  . Fracture surgery Right     20 years ago  . Colonoscopy N/A 12/24/2013    [id number] mucosa in the terminal ileum/TWO colon polyps removed/ The LEFT colon IS redundant/Small internal hemorrhoids. tubular adenoma. next TCS 12/2018  . Esophagogastroduodenoscopy N/A 02/06/2014    SLF: 1. No obvious reason for weight loss identified 2. Dyspepsia due to MILD erosive gastritis.  . Cesarean section      Allergies  Allergen Reactions  . Erythromycin     REACTION: GI upset    Review of Systems PER HPI OTHERWISE ALL SYSTEMS ARE NEGATIVE.    Objective:   Physical Exam        Assessment & Plan:

## 2015-04-22 NOTE — Telephone Encounter (Signed)
REVIEWED-NO ADDITIONAL RECOMMENDATIONS. 

## 2015-04-29 ENCOUNTER — Ambulatory Visit (INDEPENDENT_AMBULATORY_CARE_PROVIDER_SITE_OTHER): Payer: Medicare Other | Admitting: Psychology

## 2015-04-29 ENCOUNTER — Encounter (HOSPITAL_COMMUNITY): Payer: Self-pay | Admitting: Psychology

## 2015-04-29 DIAGNOSIS — F411 Generalized anxiety disorder: Secondary | ICD-10-CM

## 2015-04-29 DIAGNOSIS — F0630 Mood disorder due to known physiological condition, unspecified: Secondary | ICD-10-CM | POA: Diagnosis not present
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
## 2015-05-14 ENCOUNTER — Telehealth (HOSPITAL_COMMUNITY): Payer: Self-pay | Admitting: *Deleted

## 2015-05-24 ENCOUNTER — Encounter (HOSPITAL_COMMUNITY): Payer: Self-pay | Admitting: Psychology

## 2015-05-24 ENCOUNTER — Ambulatory Visit (HOSPITAL_COMMUNITY): Payer: Self-pay | Admitting: Psychology

## 2015-05-27 ENCOUNTER — Telehealth: Payer: Self-pay | Admitting: Gastroenterology

## 2015-05-27 ENCOUNTER — Encounter (INDEPENDENT_AMBULATORY_CARE_PROVIDER_SITE_OTHER): Payer: Self-pay | Admitting: Gastroenterology

## 2015-05-27 NOTE — Telephone Encounter (Signed)
Pt was a no show

## 2015-05-27 NOTE — Progress Notes (Signed)
REVIEWED-NO ADDITIONAL RECOMMENDATIONS. 

## 2015-05-27 NOTE — Telephone Encounter (Signed)
NO SHOW/ NO CALL #2. REVIEWED. SEND PT A LETTER IF SHE MISSES ANOTHER APPT SHE WILL BE DISCHARGED FROM THE PRACTICE.

## 2015-05-27 NOTE — Progress Notes (Signed)
   Subjective:    Patient ID: Brittany Lester, female    DOB: 1960-10-25, 55 y.o.   MRN: [id number]  HPI   Past Medical History  Diagnosis Date  . Medical history non-contributory   . Hepatitis C     Harvoni  . Hyperlipidemia   . Bipolar disorder ([place])   . Lupus (HCC)     ???  . Herpes   . Vaginal discharge 07/24/2014  . BV (bacterial vaginosis) 07/24/2014  . Atrophic vaginitis 07/24/2014  . Hepatitis C     Past Surgical History  Procedure Laterality Date  . C section  12/18/1993  . Fracture surgery Right     20 years ago  . Colonoscopy N/A 12/24/2013    [id number] mucosa in the terminal ileum/TWO colon polyps removed/ The LEFT colon IS redundant/Small internal hemorrhoids. tubular adenoma. next TCS 12/2018  . Esophagogastroduodenoscopy N/A 02/06/2014    SLF: 1. No obvious reason for weight loss identified 2. Dyspepsia due to MILD erosive gastritis.  . Cesarean section      Review of Systems     Objective:   Physical Exam        Assessment & Plan:

## 2015-05-28 ENCOUNTER — Encounter: Payer: Self-pay | Admitting: General Practice

## 2015-05-28 NOTE — Telephone Encounter (Signed)
Letter mailed

## 2015-05-31 ENCOUNTER — Ambulatory Visit (HOSPITAL_COMMUNITY): Payer: Self-pay | Admitting: Psychiatry

## 2015-06-13 ENCOUNTER — Other Ambulatory Visit (HOSPITAL_COMMUNITY): Payer: Self-pay | Admitting: Psychiatry

## 2015-06-21 ENCOUNTER — Telehealth (HOSPITAL_COMMUNITY): Payer: Self-pay | Admitting: *Deleted

## 2015-06-21 NOTE — Telephone Encounter (Signed)
Pt pharmacy requesting refills for pt Paroxetine HCL 40 mg QD. Pt medication last filled 03-11-15 with 2 refills. Pt no showed for her f/u appt on 04-20-15 and was reschedule for her 05-31-15 appt due to provider being out of office. Pt have not returned office call yet to resch appt. Pharmacy number is [phone].

## 2015-06-22 ENCOUNTER — Other Ambulatory Visit (HOSPITAL_COMMUNITY): Payer: Self-pay | Admitting: Psychiatry

## 2015-06-22 MED ORDER — PAROXETINE HCL 40 MG PO TABS: 40.0000 mg | ORAL_TABLET | Freq: Every day | ORAL | Status: DC

## 2015-06-22 NOTE — Telephone Encounter (Signed)
lmtcb, number provided

## 2015-06-22 NOTE — Telephone Encounter (Signed)
Sent, make sure she has follow up

## 2015-07-01 ENCOUNTER — Telehealth (HOSPITAL_COMMUNITY): Payer: Self-pay | Admitting: *Deleted

## 2015-07-15 ENCOUNTER — Other Ambulatory Visit (HOSPITAL_COMMUNITY): Payer: Self-pay | Admitting: Psychiatry

## 2015-07-19 ENCOUNTER — Telehealth (HOSPITAL_COMMUNITY): Payer: Self-pay | Admitting: *Deleted

## 2015-07-19 NOTE — Telephone Encounter (Signed)
phone call from [facility] pharmacy checking status of refill request for Busphar 5 mg.

## 2015-08-03 ENCOUNTER — Ambulatory Visit (INDEPENDENT_AMBULATORY_CARE_PROVIDER_SITE_OTHER): Payer: Medicare Other | Admitting: Psychology

## 2015-08-03 DIAGNOSIS — F411 Generalized anxiety disorder: Secondary | ICD-10-CM | POA: Diagnosis not present

## 2015-08-03 DIAGNOSIS — F0630 Mood disorder due to known physiological condition, unspecified: Secondary | ICD-10-CM

## 2015-08-04 ENCOUNTER — Encounter (HOSPITAL_COMMUNITY): Payer: Self-pay | Admitting: Psychology
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
## 2015-08-11 ENCOUNTER — Encounter (HOSPITAL_COMMUNITY): Payer: Self-pay | Admitting: Psychiatry

## 2015-08-11 ENCOUNTER — Ambulatory Visit (INDEPENDENT_AMBULATORY_CARE_PROVIDER_SITE_OTHER): Payer: Medicare Other | Admitting: Psychiatry

## 2015-08-11 VITALS — BP 175/117 | HR 84 | Ht 63.0 in | Wt 89.2 lb

## 2015-08-11 DIAGNOSIS — F0630 Mood disorder due to known physiological condition, unspecified: Secondary | ICD-10-CM

## 2015-08-11 DIAGNOSIS — F1994 Other psychoactive substance use, unspecified with psychoactive substance-induced mood disorder: Secondary | ICD-10-CM | POA: Diagnosis not present

## 2015-08-11 MED ORDER — CLONAZEPAM 1 MG PO TABS
1.0000 mg | ORAL_TABLET | Freq: Three times a day (TID) | ORAL | Status: DC
Start: 2015-08-11 — End: 2015-09-08

## 2015-08-11 MED ORDER — PAROXETINE HCL 40 MG PO TABS: 40.0000 mg | ORAL_TABLET | Freq: Every day | ORAL | Status: DC

## 2015-08-11 MED ORDER — TRAZODONE HCL 50 MG PO TABS: 50.0000 mg | ORAL_TABLET | Freq: Every day | ORAL | Status: DC

## 2015-08-11 NOTE — Telephone Encounter (Signed)
Pt came into office for f/u 
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
## 2015-08-24 ENCOUNTER — Ambulatory Visit (HOSPITAL_COMMUNITY): Payer: Self-pay | Admitting: Psychology

## 2015-08-24 ENCOUNTER — Encounter (HOSPITAL_COMMUNITY): Payer: Self-pay | Admitting: *Deleted

## 2015-09-07 DIAGNOSIS — K219 Gastro-esophageal reflux disease without esophagitis: Secondary | ICD-10-CM | POA: Diagnosis not present

## 2015-09-07 DIAGNOSIS — F17200 Nicotine dependence, unspecified, uncomplicated: Secondary | ICD-10-CM | POA: Diagnosis not present

## 2015-09-07 DIAGNOSIS — F319 Bipolar disorder, unspecified: Secondary | ICD-10-CM | POA: Diagnosis not present

## 2015-09-07 DIAGNOSIS — I10 Essential (primary) hypertension: Secondary | ICD-10-CM | POA: Diagnosis not present

## 2015-09-08 ENCOUNTER — Encounter (HOSPITAL_COMMUNITY): Payer: Self-pay | Admitting: Psychiatry

## 2015-09-08 ENCOUNTER — Ambulatory Visit (INDEPENDENT_AMBULATORY_CARE_PROVIDER_SITE_OTHER): Payer: Medicare Other | Admitting: Psychiatry

## 2015-09-08 VITALS — BP 167/104 | HR 75 | Ht 63.0 in | Wt 90.0 lb

## 2015-09-08 DIAGNOSIS — F1994 Other psychoactive substance use, unspecified with psychoactive substance-induced mood disorder: Secondary | ICD-10-CM

## 2015-09-08 DIAGNOSIS — F0630 Mood disorder due to known physiological condition, unspecified: Secondary | ICD-10-CM | POA: Diagnosis not present

## 2015-09-08 MED ORDER — TRAZODONE HCL 50 MG PO TABS: 50.0000 mg | ORAL_TABLET | Freq: Every day | ORAL | Status: DC

## 2015-09-08 MED ORDER — PAROXETINE HCL 40 MG PO TABS: 40.0000 mg | ORAL_TABLET | Freq: Every day | ORAL | Status: DC

## 2015-09-08 MED ORDER — CLONAZEPAM 1 MG PO TABS: 1.0000 mg | ORAL_TABLET | Freq: Three times a day (TID) | ORAL | Status: DC
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
## 2015-09-22 ENCOUNTER — Ambulatory Visit (HOSPITAL_COMMUNITY): Payer: Self-pay | Admitting: Psychology

## 2015-10-03 ENCOUNTER — Other Ambulatory Visit (HOSPITAL_COMMUNITY): Payer: Self-pay | Admitting: Psychiatry

## 2015-11-09 ENCOUNTER — Other Ambulatory Visit (HOSPITAL_COMMUNITY): Payer: Self-pay | Admitting: Psychiatry

## 2015-11-10 ENCOUNTER — Encounter (HOSPITAL_COMMUNITY): Payer: Self-pay | Admitting: Psychiatry

## 2015-11-10 ENCOUNTER — Ambulatory Visit (INDEPENDENT_AMBULATORY_CARE_PROVIDER_SITE_OTHER): Payer: Medicare Other | Admitting: Psychiatry

## 2015-11-10 ENCOUNTER — Telehealth (HOSPITAL_COMMUNITY): Payer: Self-pay | Admitting: *Deleted

## 2015-11-10 VITALS — BP 108/70 | Ht 63.0 in | Wt 87.4 lb

## 2015-11-10 DIAGNOSIS — F0630 Mood disorder due to known physiological condition, unspecified: Secondary | ICD-10-CM | POA: Diagnosis not present

## 2015-11-10 DIAGNOSIS — F411 Generalized anxiety disorder: Secondary | ICD-10-CM | POA: Diagnosis not present

## 2015-11-10 MED ORDER — TRAZODONE HCL 50 MG PO TABS: 50.0000 mg | ORAL_TABLET | Freq: Every day | ORAL | Status: DC

## 2015-11-10 MED ORDER — CLONAZEPAM 1 MG PO TABS: 1.0000 mg | ORAL_TABLET | Freq: Three times a day (TID) | ORAL | Status: DC

## 2015-11-10 MED ORDER — PAROXETINE HCL 40 MG PO TABS: 40.0000 mg | ORAL_TABLET | Freq: Every day | ORAL | Status: DC
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
## 2015-11-10 NOTE — Telephone Encounter (Signed)
Called pt to see if she is still coming to her appt on 11-10-15 due to pharmacy requesting refills for her Klonopin. lmtcb and office number provided

## 2015-11-22 ENCOUNTER — Ambulatory Visit (HOSPITAL_COMMUNITY): Payer: Self-pay | Admitting: Psychology

## 2016-01-05 DIAGNOSIS — Z0000 Encounter for general adult medical examination without abnormal findings: Secondary | ICD-10-CM | POA: Diagnosis not present

## 2016-01-05 DIAGNOSIS — E785 Hyperlipidemia, unspecified: Secondary | ICD-10-CM | POA: Diagnosis not present

## 2016-01-05 DIAGNOSIS — F319 Bipolar disorder, unspecified: Secondary | ICD-10-CM | POA: Diagnosis not present

## 2016-01-05 DIAGNOSIS — F17200 Nicotine dependence, unspecified, uncomplicated: Secondary | ICD-10-CM | POA: Diagnosis not present

## 2016-01-05 DIAGNOSIS — E43 Unspecified severe protein-calorie malnutrition: Secondary | ICD-10-CM | POA: Diagnosis not present

## 2016-01-05 DIAGNOSIS — B182 Chronic viral hepatitis C: Secondary | ICD-10-CM | POA: Diagnosis not present

## 2016-01-05 DIAGNOSIS — I10 Essential (primary) hypertension: Secondary | ICD-10-CM | POA: Diagnosis not present

## 2016-02-06 ENCOUNTER — Other Ambulatory Visit (HOSPITAL_COMMUNITY): Payer: Self-pay | Admitting: Psychiatry

## 2016-02-08 ENCOUNTER — Telehealth (HOSPITAL_COMMUNITY): Payer: Self-pay | Admitting: *Deleted

## 2016-02-08 ENCOUNTER — Ambulatory Visit (INDEPENDENT_AMBULATORY_CARE_PROVIDER_SITE_OTHER): Payer: Medicare Other | Admitting: Psychology

## 2016-02-08 ENCOUNTER — Encounter (HOSPITAL_COMMUNITY): Payer: Self-pay | Admitting: Psychology

## 2016-02-08 DIAGNOSIS — F0630 Mood disorder due to known physiological condition, unspecified: Secondary | ICD-10-CM | POA: Diagnosis not present

## 2016-02-08 DIAGNOSIS — F411 Generalized anxiety disorder: Secondary | ICD-10-CM

## 2016-02-08 MED ORDER — CLONAZEPAM 1 MG PO TABS: 1.0000 mg | ORAL_TABLET | Freq: Three times a day (TID) | ORAL | 0 refills | Status: DC
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
## 2016-02-08 NOTE — Telephone Encounter (Signed)
Pt came into office to see another provider this morning. Per pt, she is out of her Clonazepam 1 mg TID. Per pt bottle, she last filled medication on 01-09-2016. Pt medication was last written on 11-10-2015 with 2 refill. Pt f/u appt is scheduled for February 10, 2016 with Dr. Harrington Challenger.

## 2016-02-08 NOTE — Telephone Encounter (Signed)
Per Dr. Harrington Challenger to call in refill for pt Klonopin 1 mg for only 1 mo supply. Called pt pharmacy and spoke with Claiborne Billings and she verbalized understanding.

## 2016-02-08 NOTE — Telephone Encounter (Signed)
You may call in 30 day supply 

## 2016-02-09 NOTE — Telephone Encounter (Signed)
Called pt pharmacy and spoke with Claiborne Billings

## 2016-02-10 ENCOUNTER — Encounter (HOSPITAL_COMMUNITY): Payer: Self-pay | Admitting: Psychiatry

## 2016-02-10 ENCOUNTER — Ambulatory Visit (INDEPENDENT_AMBULATORY_CARE_PROVIDER_SITE_OTHER): Payer: Medicare Other | Admitting: Psychiatry

## 2016-02-10 VITALS — BP 146/100 | HR 98 | Ht 63.0 in | Wt 86.2 lb

## 2016-02-10 DIAGNOSIS — F0630 Mood disorder due to known physiological condition, unspecified: Secondary | ICD-10-CM | POA: Diagnosis not present

## 2016-02-10 MED ORDER — TRAZODONE HCL 50 MG PO TABS: 50.0000 mg | ORAL_TABLET | Freq: Every day | ORAL | 2 refills | Status: DC

## 2016-02-10 MED ORDER — PAROXETINE HCL 40 MG PO TABS: 40.0000 mg | ORAL_TABLET | Freq: Every day | ORAL | 2 refills | Status: DC

## 2016-02-10 MED ORDER — MIRTAZAPINE 15 MG PO TABS: 15.0000 mg | ORAL_TABLET | Freq: Every day | ORAL | 2 refills | Status: DC

## 2016-02-10 MED ORDER — CLONAZEPAM 1 MG PO TABS: 1.0000 mg | ORAL_TABLET | Freq: Three times a day (TID) | ORAL | 2 refills | Status: DC
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
## 2016-02-29 ENCOUNTER — Ambulatory Visit (HOSPITAL_COMMUNITY): Payer: Medicare Other | Admitting: Psychology

## 2016-03-29 ENCOUNTER — Ambulatory Visit (HOSPITAL_COMMUNITY): Payer: Self-pay | Admitting: Psychology

## 2016-04-05 ENCOUNTER — Ambulatory Visit (HOSPITAL_COMMUNITY): Payer: Self-pay | Admitting: Psychology

## 2016-04-06 ENCOUNTER — Ambulatory Visit (HOSPITAL_COMMUNITY): Payer: Self-pay | Admitting: Psychology

## 2016-04-10 ENCOUNTER — Ambulatory Visit (HOSPITAL_COMMUNITY): Payer: Self-pay | Admitting: Psychology

## 2016-04-11 DIAGNOSIS — Z23 Encounter for immunization: Secondary | ICD-10-CM | POA: Diagnosis not present

## 2016-04-11 DIAGNOSIS — B182 Chronic viral hepatitis C: Secondary | ICD-10-CM | POA: Diagnosis not present

## 2016-04-11 DIAGNOSIS — I10 Essential (primary) hypertension: Secondary | ICD-10-CM | POA: Diagnosis not present

## 2016-04-11 DIAGNOSIS — F17200 Nicotine dependence, unspecified, uncomplicated: Secondary | ICD-10-CM | POA: Diagnosis not present

## 2016-04-11 DIAGNOSIS — K219 Gastro-esophageal reflux disease without esophagitis: Secondary | ICD-10-CM | POA: Diagnosis not present

## 2016-04-21 ENCOUNTER — Telehealth: Payer: Self-pay | Admitting: Adult Health

## 2016-04-21 MED ORDER — VALACYCLOVIR HCL 1 G PO TABS: 1000.0000 mg | ORAL_TABLET | Freq: Two times a day (BID) | ORAL | 99 refills | Status: DC

## 2016-04-21 NOTE — Telephone Encounter (Signed)
Valtrex sent to [facility] 

## 2016-04-26 ENCOUNTER — Ambulatory Visit (INDEPENDENT_AMBULATORY_CARE_PROVIDER_SITE_OTHER): Payer: Medicare Other | Admitting: Adult Health

## 2016-04-26 ENCOUNTER — Other Ambulatory Visit: Payer: Self-pay | Admitting: Adult Health

## 2016-04-26 ENCOUNTER — Encounter: Payer: Self-pay | Admitting: Adult Health

## 2016-04-26 VITALS — BP 142/80 | HR 106 | Ht 62.0 in | Wt 87.0 lb

## 2016-04-26 DIAGNOSIS — R350 Frequency of micturition: Secondary | ICD-10-CM | POA: Diagnosis not present

## 2016-04-26 DIAGNOSIS — N760 Acute vaginitis: Secondary | ICD-10-CM

## 2016-04-26 DIAGNOSIS — B9689 Other specified bacterial agents as the cause of diseases classified elsewhere: Secondary | ICD-10-CM | POA: Diagnosis not present

## 2016-04-26 DIAGNOSIS — N952 Postmenopausal atrophic vaginitis: Secondary | ICD-10-CM | POA: Diagnosis not present

## 2016-04-26 DIAGNOSIS — N898 Other specified noninflammatory disorders of vagina: Secondary | ICD-10-CM | POA: Diagnosis not present

## 2016-04-26 LAB — POCT URINALYSIS DIPSTICK
Glucose, UA: NEGATIVE
Ketones, UA: NEGATIVE
Leukocytes, UA: NEGATIVE
NITRITE UA: NEGATIVE
Protein, UA: NEGATIVE

## 2016-04-26 LAB — POCT WET PREP (WET MOUNT)
Clue Cells Wet Prep Whiff POC: POSITIVE
WBC WET PREP: POSITIVE

## 2016-04-26 MED ORDER — METRONIDAZOLE 500 MG PO TABS: 500.0000 mg | ORAL_TABLET | Freq: Three times a day (TID) | ORAL | 0 refills | Status: DC

## 2016-04-26 NOTE — Progress Notes (Signed)
Subjective:     Patient ID: Brittany Lester, female   DOB: [birth year], 55 y.o.   MRN: [id number]  HPI Brittany Lester is a 55 year old white female in complaining of vaginal discharge with itch and odor at times and urinary frequency.Has had sex and it hurt at first. PCP is Dr Legrand Rams and she sees Dr Harrington Challenger.   Review of Systems +vaginal discharge +vaginal itch Vaginal odor Urinary frequency  Had sex and it hurt at first Reviewed past medical,surgical, social and family history. Reviewed medications and allergies.     Objective:   Physical Exam BP (!) 142/80 (BP Location: Left Arm, Patient Position: Sitting, Cuff Size: Normal)   Pulse (!) 106   Ht 5\' 2"  (1.575 m)   Wt 87 lb (39.5 kg)   BMI 15.91 kg/m   PHQ 2 score 0. Urine dipstick trace blood. Skin warm and dry.Pelvic: external genitalia is normal in appearance no lesions, vagina:creamy greenish discharge with odor,sidewalls red,urethra has no lesions or masses noted, cervix:smooth, uterus: normal size, shape and contour, non tender, no masses felt, adnexa: no masses or tenderness noted. Bladder is non tender and no masses felt. Wet prep: + for clue cells and +WBCs. GC/CHL obtained. Discussed that vaginal tissue thin and after treating BV may add estrogen vaginal cream and use good lubricate with sex. Face time 15 minutes.    Assessment:     1. Vaginal discharge   2. BV (bacterial vaginosis)   3. Urinary frequency   4. Atrophic vaginitis       Plan:    GC/CHL sent  Meds ordered this encounter  Medications  . metroNIDAZOLE (FLAGYL) 500 MG tablet    Sig: Take 1 tablet (500 mg total) by mouth 3 (three) times daily.    Dispense:  21 tablet    Refill:  0    Order Specific Question:   Supervising Provider    Answer:   Tania Ade H [[id number]]  No sex or alcohol.review handout on BV Follow up in 2 weeks and will add premarin vaginal cream then if desired

## 2016-04-26 NOTE — Patient Instructions (Signed)
Bacterial Vaginosis Bacterial vaginosis is a vaginal infection that occurs when the normal balance of bacteria in the vagina is disrupted. It results from an overgrowth of certain bacteria. This is the most common vaginal infection among women ages 15-44. Because bacterial vaginosis increases your risk for STIs (sexually transmitted infections), getting treated can help reduce your risk for chlamydia, gonorrhea, herpes, and HIV (human immunodeficiency virus). Treatment is also important for preventing complications in pregnant women, because this condition can cause an early (premature) delivery. What are the causes? This condition is caused by an increase in harmful bacteria that are normally present in small amounts in the vagina. However, the reason that the condition develops is not fully understood. What increases the risk? The following factors may make you more likely to develop this condition:  Having a new sexual partner or multiple sexual partners.  Having unprotected sex.  Douching.  Having an intrauterine device (IUD).  Smoking.  Drug and alcohol abuse.  Taking certain antibiotic medicines.  Being pregnant.  You cannot get bacterial vaginosis from toilet seats, bedding, swimming pools, or contact with objects around you. What are the signs or symptoms? Symptoms of this condition include:  Grey or white vaginal discharge. The discharge can also be watery or foamy.  A fish-like odor with discharge, especially after sexual intercourse or during menstruation.  Itching in and around the vagina.  Burning or pain with urination.  Some women with bacterial vaginosis have no signs or symptoms. How is this diagnosed? This condition is diagnosed based on:  Your medical history.  A physical exam of the vagina.  Testing a sample of vaginal fluid under a microscope to look for a large amount of bad bacteria or abnormal cells. Your health care provider may use a cotton swab  or a small wooden spatula to collect the sample.  How is this treated? This condition is treated with antibiotics. These may be given as a pill, a vaginal cream, or a medicine that is put into the vagina (suppository). If the condition comes back after treatment, a second round of antibiotics may be needed. Follow these instructions at home: Medicines  Take over-the-counter and prescription medicines only as told by your health care provider.  Take or use your antibiotic as told by your health care provider. Do not stop taking or using the antibiotic even if you start to feel better. General instructions  If you have a female sexual partner, tell her that you have a vaginal infection. She should see her health care provider and be treated if she has symptoms. If you have a female sexual partner, he does not need treatment.  During treatment: ? Avoid sexual activity until you finish treatment. ? Do not douche. ? Avoid alcohol as directed by your health care provider. ? Avoid breastfeeding as directed by your health care provider.  Drink enough water and fluids to keep your urine clear or pale yellow.  Keep the area around your vagina and rectum clean. ? Wash the area daily with warm water. ? Wipe yourself from front to back after using the toilet.  Keep all follow-up visits as told by your health care provider. This is important. How is this prevented?  Do not douche.  Wash the outside of your vagina with warm water only.  Use protection when having sex. This includes latex condoms and dental dams.  Limit how many sexual partners you have. To help prevent bacterial vaginosis, it is best to have sex with just   one partner (monogamous).  Make sure you and your sexual partner are tested for STIs.  Wear cotton or cotton-lined underwear.  Avoid wearing tight pants and pantyhose, especially during summer.  Limit the amount of alcohol that you drink.  Do not use any products that  contain nicotine or tobacco, such as cigarettes and e-cigarettes. If you need help quitting, ask your health care provider.  Do not use illegal drugs. Where to find more information:  Centers for Disease Control and Prevention: www.cdc.gov/std  American Sexual Health Association (ASHA): www.ashastd.org  U.S. Department of Health and Human Services, Office on Women's Health: www.womenshealth.gov/ or https://www.womenshealth.gov/a-z-topics/bacterial-vaginosis Contact a health care provider if:  Your symptoms do not improve, even after treatment.  You have more discharge or pain when urinating.  You have a fever.  You have pain in your abdomen.  You have pain during sex.  You have vaginal bleeding between periods. Summary  Bacterial vaginosis is a vaginal infection that occurs when the normal balance of bacteria in the vagina is disrupted.  Because bacterial vaginosis increases your risk for STIs (sexually transmitted infections), getting treated can help reduce your risk for chlamydia, gonorrhea, herpes, and HIV (human immunodeficiency virus). Treatment is also important for preventing complications in pregnant women, because the condition can cause an early (premature) delivery.  This condition is treated with antibiotic medicines. These may be given as a pill, a vaginal cream, or a medicine that is put into the vagina (suppository). This information is not intended to replace advice given to you by your health care provider. Make sure you discuss any questions you have with your health care provider. Document Released: 04/24/2005 Document Revised: 01/08/2016 Document Reviewed: 01/08/2016 Elsevier Interactive Patient Education  2017 [employer]  

## 2016-04-27 LAB — GC/CHLAMYDIA PROBE AMP
Chlamydia trachomatis, NAA: NEGATIVE
NEISSERIA GONORRHOEAE BY PCR: NEGATIVE

## 2016-04-28 ENCOUNTER — Telehealth (HOSPITAL_COMMUNITY): Payer: Self-pay | Admitting: *Deleted

## 2016-04-28 NOTE — Telephone Encounter (Signed)
returned phone call regarding appointment. 

## 2016-05-10 ENCOUNTER — Ambulatory Visit: Payer: Medicare Other | Admitting: Adult Health

## 2016-05-12 ENCOUNTER — Ambulatory Visit (HOSPITAL_COMMUNITY): Payer: Self-pay | Admitting: Psychiatry

## 2016-05-16 ENCOUNTER — Encounter: Payer: Self-pay | Admitting: Adult Health

## 2016-05-16 ENCOUNTER — Ambulatory Visit (INDEPENDENT_AMBULATORY_CARE_PROVIDER_SITE_OTHER): Payer: Medicare Other | Admitting: Adult Health

## 2016-05-16 VITALS — BP 99/64 | HR 84 | Ht 62.0 in | Wt 91.0 lb

## 2016-05-16 DIAGNOSIS — N952 Postmenopausal atrophic vaginitis: Secondary | ICD-10-CM

## 2016-05-16 MED ORDER — ESTROGENS, CONJUGATED 0.625 MG/GM VA CREA: TOPICAL_CREAM | VAGINAL | 12 refills | Status: DC

## 2016-05-16 MED ORDER — VALACYCLOVIR HCL 1 G PO TABS: 1000.0000 mg | ORAL_TABLET | Freq: Two times a day (BID) | ORAL | 99 refills | Status: DC

## 2016-05-16 NOTE — Progress Notes (Signed)
Subjective:     Patient ID: Brittany Lester, female   DOB: June 11, 1960, 56 y.o.   MRN: [id number]  HPI Brittany Lester is a 56 year old white female back in follow up of recent treatment for BV and has no discharge,itching or odor.   Review of Systems  No discharge,itch or odor  Reviewed past medical,surgical, social and family history. Reviewed medications and allergies.     Objective:   Physical Exam BP 99/64 (BP Location: Left Arm, Patient Position: Sitting, Cuff Size: Small)   Pulse 84   Ht 5\' 2"  (1.575 m)   Wt 91 lb (41.3 kg)   BMI 16.64 kg/m   PHQ 2 score 0. Skin warm and dry.Pelvic: external genitalia is normal in appearance no lesions, vagina: no discharge, atrophic changes   She does want to try estrogen vaginal cream, will give samples to try, lot [id number] exp 10/18.  Assessment:     1. Atrophic vaginitis       Plan:    Use astroglide with sex Meds ordered this encounter  Medications  . valACYclovir (VALTREX) 1000 MG tablet    Sig: Take 1 tablet (1,000 mg total) by mouth 2 (two) times daily.    Dispense:  30 tablet    Refill:  prn    Order Specific Question:   Supervising Provider    Answer:   Elonda Husky, LUTHER H [[id number]]  . conjugated estrogens (PREMARIN) vaginal cream    Sig: Use 1 gm in vagina for 2 weeks then 2-3 x weekly    Dispense:  20 g    Refill:  12    Order Specific Question:   Supervising Provider    Answer:   Tania Ade H [[id number]]  Follow up prn

## 2016-05-30 ENCOUNTER — Encounter (HOSPITAL_COMMUNITY): Payer: Self-pay | Admitting: Psychiatry

## 2016-05-30 ENCOUNTER — Ambulatory Visit (INDEPENDENT_AMBULATORY_CARE_PROVIDER_SITE_OTHER): Payer: Medicare Other | Admitting: Psychiatry

## 2016-05-30 VITALS — BP 125/80 | HR 84 | Ht 62.0 in | Wt 88.4 lb

## 2016-05-30 DIAGNOSIS — Z811 Family history of alcohol abuse and dependence: Secondary | ICD-10-CM | POA: Diagnosis not present

## 2016-05-30 DIAGNOSIS — Z79899 Other long term (current) drug therapy: Secondary | ICD-10-CM | POA: Diagnosis not present

## 2016-05-30 DIAGNOSIS — Z813 Family history of other psychoactive substance abuse and dependence: Secondary | ICD-10-CM

## 2016-05-30 DIAGNOSIS — Z8349 Family history of other endocrine, nutritional and metabolic diseases: Secondary | ICD-10-CM

## 2016-05-30 DIAGNOSIS — Z800 Family history of malignant neoplasm of digestive organs: Secondary | ICD-10-CM

## 2016-05-30 DIAGNOSIS — Z8249 Family history of ischemic heart disease and other diseases of the circulatory system: Secondary | ICD-10-CM

## 2016-05-30 DIAGNOSIS — F0630 Mood disorder due to known physiological condition, unspecified: Secondary | ICD-10-CM

## 2016-05-30 MED ORDER — MIRTAZAPINE 15 MG PO TABS: 15.0000 mg | ORAL_TABLET | Freq: Every day | ORAL | 2 refills | Status: DC

## 2016-05-30 MED ORDER — TRAZODONE HCL 50 MG PO TABS: 50.0000 mg | ORAL_TABLET | Freq: Every day | ORAL | 2 refills | Status: DC

## 2016-05-30 MED ORDER — PAROXETINE HCL 40 MG PO TABS: 40.0000 mg | ORAL_TABLET | Freq: Every day | ORAL | 2 refills | Status: DC

## 2016-05-30 MED ORDER — CLONAZEPAM 1 MG PO TABS: 1.0000 mg | ORAL_TABLET | Freq: Three times a day (TID) | ORAL | 2 refills | Status: DC
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
## 2016-08-25 ENCOUNTER — Ambulatory Visit (HOSPITAL_COMMUNITY): Payer: Self-pay | Admitting: Psychiatry

## 2016-09-21 ENCOUNTER — Other Ambulatory Visit (HOSPITAL_COMMUNITY): Payer: Self-pay | Admitting: Psychiatry

## 2016-10-04 ENCOUNTER — Other Ambulatory Visit (HOSPITAL_COMMUNITY): Payer: Self-pay | Admitting: Psychiatry

## 2016-10-13 DIAGNOSIS — Z130 Encounter for screening for diseases of the blood and blood-forming organs and certain disorders involving the immune mechanism: Secondary | ICD-10-CM | POA: Diagnosis not present

## 2016-10-13 DIAGNOSIS — J449 Chronic obstructive pulmonary disease, unspecified: Secondary | ICD-10-CM | POA: Diagnosis not present

## 2016-10-13 DIAGNOSIS — Z139 Encounter for screening, unspecified: Secondary | ICD-10-CM | POA: Diagnosis not present

## 2016-10-13 DIAGNOSIS — F17200 Nicotine dependence, unspecified, uncomplicated: Secondary | ICD-10-CM | POA: Diagnosis not present

## 2016-10-13 DIAGNOSIS — Z0000 Encounter for general adult medical examination without abnormal findings: Secondary | ICD-10-CM | POA: Diagnosis not present

## 2016-10-13 DIAGNOSIS — G4700 Insomnia, unspecified: Secondary | ICD-10-CM | POA: Diagnosis not present

## 2016-10-13 DIAGNOSIS — J302 Other seasonal allergic rhinitis: Secondary | ICD-10-CM | POA: Diagnosis not present

## 2016-10-19 DIAGNOSIS — F3163 Bipolar disorder, current episode mixed, severe, without psychotic features: Secondary | ICD-10-CM | POA: Diagnosis not present

## 2016-10-26 DIAGNOSIS — J320 Chronic maxillary sinusitis: Secondary | ICD-10-CM | POA: Diagnosis not present

## 2016-11-01 ENCOUNTER — Encounter: Payer: Self-pay | Admitting: Gastroenterology

## 2016-12-01 DIAGNOSIS — F3110 Bipolar disorder, current episode manic without psychotic features, unspecified: Secondary | ICD-10-CM | POA: Diagnosis not present

## 2016-12-13 NOTE — Progress Notes (Signed)
REVIEWED-NO ADDITIONAL RECOMMENDATIONS. 

## 2017-02-02 DIAGNOSIS — E559 Vitamin D deficiency, unspecified: Secondary | ICD-10-CM | POA: Diagnosis not present

## 2017-02-02 DIAGNOSIS — E785 Hyperlipidemia, unspecified: Secondary | ICD-10-CM | POA: Diagnosis not present

## 2017-02-02 DIAGNOSIS — Z8619 Personal history of other infectious and parasitic diseases: Secondary | ICD-10-CM | POA: Diagnosis not present

## 2017-02-02 DIAGNOSIS — Z87898 Personal history of other specified conditions: Secondary | ICD-10-CM | POA: Diagnosis not present

## 2017-02-02 DIAGNOSIS — R221 Localized swelling, mass and lump, neck: Secondary | ICD-10-CM | POA: Diagnosis not present

## 2017-02-02 DIAGNOSIS — R5383 Other fatigue: Secondary | ICD-10-CM | POA: Diagnosis not present

## 2017-02-02 DIAGNOSIS — M533 Sacrococcygeal disorders, not elsewhere classified: Secondary | ICD-10-CM | POA: Diagnosis not present

## 2017-02-02 DIAGNOSIS — I10 Essential (primary) hypertension: Secondary | ICD-10-CM | POA: Diagnosis not present

## 2017-02-02 DIAGNOSIS — F17200 Nicotine dependence, unspecified, uncomplicated: Secondary | ICD-10-CM | POA: Diagnosis not present

## 2017-02-05 DIAGNOSIS — R5383 Other fatigue: Secondary | ICD-10-CM | POA: Diagnosis not present

## 2017-02-05 DIAGNOSIS — E559 Vitamin D deficiency, unspecified: Secondary | ICD-10-CM | POA: Diagnosis not present

## 2017-02-05 DIAGNOSIS — Z8619 Personal history of other infectious and parasitic diseases: Secondary | ICD-10-CM | POA: Diagnosis not present

## 2017-02-05 DIAGNOSIS — I10 Essential (primary) hypertension: Secondary | ICD-10-CM | POA: Diagnosis not present

## 2017-02-05 DIAGNOSIS — R221 Localized swelling, mass and lump, neck: Secondary | ICD-10-CM | POA: Diagnosis not present

## 2017-02-05 DIAGNOSIS — F17200 Nicotine dependence, unspecified, uncomplicated: Secondary | ICD-10-CM | POA: Diagnosis not present

## 2017-02-05 DIAGNOSIS — M533 Sacrococcygeal disorders, not elsewhere classified: Secondary | ICD-10-CM | POA: Diagnosis not present

## 2017-02-05 DIAGNOSIS — E785 Hyperlipidemia, unspecified: Secondary | ICD-10-CM | POA: Diagnosis not present

## 2017-02-05 DIAGNOSIS — Z87898 Personal history of other specified conditions: Secondary | ICD-10-CM | POA: Diagnosis not present

## 2017-02-13 DIAGNOSIS — Z23 Encounter for immunization: Secondary | ICD-10-CM | POA: Diagnosis not present

## 2017-02-19 DIAGNOSIS — R221 Localized swelling, mass and lump, neck: Secondary | ICD-10-CM | POA: Diagnosis not present

## 2017-02-19 DIAGNOSIS — E041 Nontoxic single thyroid nodule: Secondary | ICD-10-CM | POA: Diagnosis not present

## 2017-05-29 DIAGNOSIS — F3110 Bipolar disorder, current episode manic without psychotic features, unspecified: Secondary | ICD-10-CM | POA: Diagnosis not present

## 2017-10-12 DIAGNOSIS — F3160 Bipolar disorder, current episode mixed, unspecified: Secondary | ICD-10-CM | POA: Diagnosis not present

## 2017-10-24 DIAGNOSIS — E785 Hyperlipidemia, unspecified: Secondary | ICD-10-CM | POA: Diagnosis not present

## 2017-10-24 DIAGNOSIS — Z716 Tobacco abuse counseling: Secondary | ICD-10-CM | POA: Diagnosis not present

## 2017-10-24 DIAGNOSIS — F17200 Nicotine dependence, unspecified, uncomplicated: Secondary | ICD-10-CM | POA: Diagnosis not present

## 2017-10-24 DIAGNOSIS — I10 Essential (primary) hypertension: Secondary | ICD-10-CM | POA: Diagnosis not present

## 2017-10-24 DIAGNOSIS — R0602 Shortness of breath: Secondary | ICD-10-CM | POA: Diagnosis not present

## 2017-10-24 DIAGNOSIS — J449 Chronic obstructive pulmonary disease, unspecified: Secondary | ICD-10-CM | POA: Diagnosis not present

## 2017-11-13 DIAGNOSIS — J449 Chronic obstructive pulmonary disease, unspecified: Secondary | ICD-10-CM | POA: Diagnosis not present

## 2017-11-13 DIAGNOSIS — R0602 Shortness of breath: Secondary | ICD-10-CM | POA: Diagnosis not present

## 2018-12-03 ENCOUNTER — Encounter: Payer: Self-pay | Admitting: Internal Medicine

## 2019-08-09 ENCOUNTER — Encounter (HOSPITAL_COMMUNITY): Payer: Self-pay | Admitting: *Deleted

## 2019-08-09 ENCOUNTER — Emergency Department (HOSPITAL_COMMUNITY): Payer: Medicare Other

## 2019-08-09 ENCOUNTER — Other Ambulatory Visit: Payer: Self-pay

## 2019-08-09 ENCOUNTER — Emergency Department (HOSPITAL_COMMUNITY)
Admission: EM | Admit: 2019-08-09 | Discharge: 2019-08-10 | Disposition: A | Discharge status: Home / Self Care | Payer: Medicare Other | Attending: Emergency Medicine | Admitting: Emergency Medicine

## 2019-08-09 DIAGNOSIS — F1990 Other psychoactive substance use, unspecified, uncomplicated: Secondary | ICD-10-CM | POA: Diagnosis not present

## 2019-08-09 DIAGNOSIS — F17210 Nicotine dependence, cigarettes, uncomplicated: Secondary | ICD-10-CM | POA: Insufficient documentation

## 2019-08-09 DIAGNOSIS — Z79899 Other long term (current) drug therapy: Secondary | ICD-10-CM | POA: Diagnosis not present

## 2019-08-09 DIAGNOSIS — I10 Essential (primary) hypertension: Secondary | ICD-10-CM | POA: Diagnosis not present

## 2019-08-09 DIAGNOSIS — F1410 Cocaine abuse, uncomplicated: Secondary | ICD-10-CM

## 2019-08-09 DIAGNOSIS — R4182 Altered mental status, unspecified: Secondary | ICD-10-CM | POA: Diagnosis present

## 2019-08-09 HISTORY — DX: Essential (primary) hypertension: I10

## 2019-08-09 LAB — CBC WITH DIFFERENTIAL/PLATELET
Abs Immature Granulocytes: 0.01 10*3/uL (ref 0.00–0.07)
Basophils Absolute: 0.1 10*3/uL (ref 0.0–0.1)
Basophils Relative: 1 %
Eosinophils Absolute: 0.1 10*3/uL (ref 0.0–0.5)
Eosinophils Relative: 1 %
HCT: 52.3 % — ABNORMAL HIGH (ref 36.0–46.0)
Hemoglobin: 17.3 g/dL — ABNORMAL HIGH (ref 12.0–15.0)
Immature Granulocytes: 0 %
Lymphocytes Relative: 20 %
Lymphs Abs: 1.3 10*3/uL (ref 0.7–4.0)
MCH: 29.6 pg (ref 26.0–34.0)
MCHC: 33.1 g/dL (ref 30.0–36.0)
MCV: 89.4 fL (ref 80.0–100.0)
Monocytes Absolute: 0.3 10*3/uL (ref 0.1–1.0)
Monocytes Relative: 5 %
Neutro Abs: 4.6 10*3/uL (ref 1.7–7.7)
Neutrophils Relative %: 73 %
Platelets: 217 10*3/uL (ref 150–400)
RBC: 5.85 MIL/uL — ABNORMAL HIGH (ref 3.87–5.11)
RDW: 13 % (ref 11.5–15.5)
WBC: 6.4 10*3/uL (ref 4.0–10.5)
nRBC: 0 % (ref 0.0–0.2)

## 2019-08-09 LAB — URINALYSIS, ROUTINE W REFLEX MICROSCOPIC
Bilirubin Urine: NEGATIVE
Glucose, UA: NEGATIVE mg/dL
Ketones, ur: 80 mg/dL — AB
Nitrite: NEGATIVE
Protein, ur: 100 mg/dL — AB
Specific Gravity, Urine: 1.018 (ref 1.005–1.030)
pH: 5 (ref 5.0–8.0)

## 2019-08-09 LAB — COMPREHENSIVE METABOLIC PANEL
ALT: <5 U/L (ref 0–44)
AST: 42 U/L — ABNORMAL HIGH (ref 15–41)
Albumin: 4.9 g/dL (ref 3.5–5.0)
Alkaline Phosphatase: 113 U/L (ref 38–126)
Anion gap: 20 — ABNORMAL HIGH (ref 5–15)
BUN: 16 mg/dL (ref 6–20)
CO2: 16 mmol/L — ABNORMAL LOW (ref 22–32)
Calcium: 9.7 mg/dL (ref 8.9–10.3)
Chloride: 92 mmol/L — ABNORMAL LOW (ref 98–111)
Creatinine, Ser: 1.2 mg/dL — ABNORMAL HIGH (ref 0.44–1.00)
GFR calc Af Amer: 58 mL/min — ABNORMAL LOW (ref >60)
GFR calc non Af Amer: 50 mL/min — ABNORMAL LOW (ref >60)
Glucose, Bld: 131 mg/dL — ABNORMAL HIGH (ref 70–99)
Potassium: 3.5 mmol/L (ref 3.5–5.1)
Sodium: 128 mmol/L — ABNORMAL LOW (ref 135–145)
Total Bilirubin: 1 mg/dL (ref 0.3–1.2)
Total Protein: 8.8 g/dL — ABNORMAL HIGH (ref 6.5–8.1)

## 2019-08-09 LAB — RAPID URINE DRUG SCREEN, HOSP PERFORMED
Amphetamines: NOT DETECTED
Barbiturates: NOT DETECTED
Benzodiazepines: NOT DETECTED
Cocaine: POSITIVE — AB
Opiates: NOT DETECTED
Tetrahydrocannabinol: NOT DETECTED

## 2019-08-09 LAB — ETHANOL: Alcohol, Ethyl (B): <10 mg/dL (ref <10)

## 2019-08-09 MED ORDER — LACTATED RINGERS IV BOLUS
1000.0000 mL | Freq: Once | INTRAVENOUS | Status: AC
  Administered 2019-08-09: 1000 mL via INTRAVENOUS

## 2019-08-09 NOTE — ED Triage Notes (Addendum)
Pt arrived to er by ems after daughter called for "shaking" episode X2, pt uncooperative with staff, does not know where she is at, what is going on. Ems reports that pt's daughter told them pt has been clean from cocaine use for 6 months.

## 2019-08-09 NOTE — ED Notes (Signed)
Pt ambulatory to bathroom with no difficulty or distress noted at this time.

## 2019-08-09 NOTE — ED Notes (Signed)
ED Provider at bedside. 

## 2019-08-09 NOTE — ED Provider Notes (Addendum)
[facility] EMERGENCY DEPARTMENT Provider Note   CSN: [id number] Arrival date & time: 08/09/19  1928     History Chief Complaint  Patient presents with  . Altered Mental Status    Brittany Lester is a 59 y.o. female. Level 5 caveat due to altered mental status. HPI Patient presents with altered mental status.  Reportedly EMS had run to the house earlier but patient had refused to command.  Reportedly had a "shaking episode.  Patient really cannot provide much history but tells me her name.  Cannot tell me what kind of room she is in.  Denies pain but really cannot provide other history.  Appears history of substance abuse.  Patient's daughter reportedly told EMS that she been clean from cocaine for 6 months.    Past Medical History:  Diagnosis Date  . Atrophic vaginitis 07/24/2014  . Bipolar disorder ([place])   . BV (bacterial vaginosis) 07/24/2014  . Hepatitis C    Harvoni  . Hepatitis C   . Herpes   . Hyperlipidemia   . Hypertension   . Lupus ([place])    ???  . Medical history non-contributory   . Vaginal discharge 07/24/2014    Patient Active Problem List   Diagnosis Date Noted  . Constipation 08/03/2014  . Current smoker 08/03/2014  . HCV infection 08/03/2014  . Vaginal discharge 07/24/2014  . BV (bacterial vaginosis) 07/24/2014  . Atrophic vaginitis 07/24/2014  . Loss of weight 01/06/2014  . Abdominal pain, unspecified site 01/06/2014  . PRURITUS 09/28/2008  . TRANSAMINASES, SERUM, ELEVATED 04/07/2008  . ECZEMA 03/04/2008  . Chronic hepatitis C virus infection ([place]) 12/10/2007  . HYPERLIPIDEMIA 12/10/2007  . BIPOLAR DISORDER UNSPECIFIED 12/10/2007  . TOBACCO ABUSE 12/10/2007  . HYPERTENSION 12/10/2007  . ALLERGIC RHINITIS 12/10/2007  . GERD 12/10/2007  . LUPUS 12/10/2007  . ARTHRITIS 12/10/2007  . LOW BACK PAIN 12/10/2007  . Pain in limb 12/10/2007    Past Surgical History:  Procedure Laterality Date  . c section  12/18/1993  . CESAREAN SECTION    .  COLONOSCOPY N/A 12/24/2013   [id number] mucosa in the terminal ileum/TWO colon polyps removed/ The LEFT colon IS redundant/Small internal hemorrhoids. tubular adenoma. next TCS 12/2018  . ESOPHAGOGASTRODUODENOSCOPY N/A 02/06/2014   SLF: 1. No obvious reason for weight loss identified 2. Dyspepsia due to MILD erosive gastritis.  Marland Kitchen FRACTURE SURGERY Right    20 years ago     OB History    Gravida  2   Para  2   Term  2   Preterm      AB      Living  2     SAB      TAB      Ectopic      Multiple      Live Births  2           Family History  Problem Relation Age of Onset  . Colon cancer Maternal Aunt        age 6  . Heart attack Mother   . Thyroid disease Mother   . Heart attack Father   . Alcohol abuse Father   . Depression Father   . Heart disease Sister   . Thyroid disease Sister   . Bipolar disorder Sister   . Drug abuse Sister   . Bipolar disorder Brother   . Drug abuse Brother   . Drug abuse Son   . Liver disease Neg Hx     Social History  Tobacco Use  . Smoking status: Current Every Day Smoker    Packs/day: 0.50    Years: 40.00    Pack years: 20.00    Types: Cigarettes  . Smokeless tobacco: Never Used  Substance Use Topics  . Alcohol use: No    Alcohol/week: 0.0 standard drinks  . Drug use: No    Comment: H/O IVDU, quit 2006    Home Medications Prior to Admission medications   Medication Sig Start Date End Date Taking? Authorizing Provider  atorvastatin (LIPITOR) 40 MG tablet Take 40 mg by mouth daily. 08/07/14   [provider]  clonazePAM (KLONOPIN) 1 MG tablet Take 1 tablet (1 mg total) by mouth 3 (three) times daily. 05/30/16 05/30/17  Cloria Spring, MD  conjugated estrogens (PREMARIN) vaginal cream Use 1 gm in vagina for 2 weeks then 2-3 x weekly 05/16/16   Derrek Monaco A, NP  lisinopril (PRINIVIL,ZESTRIL) 10 MG tablet Take 10 mg by mouth daily. 08/07/14   [provider]  mirtazapine (REMERON) 15 MG tablet Take 1  tablet (15 mg total) by mouth at bedtime. 05/30/16   Cloria Spring, MD  PARoxetine (PAXIL) 40 MG tablet Take 1 tablet (40 mg total) by mouth daily. 05/30/16 05/30/17  Cloria Spring, MD  traMADol (ULTRAM) 50 MG tablet Take 100 mg by mouth 2 (two) times daily.     [provider]  traZODone (DESYREL) 50 MG tablet Take 1 tablet (50 mg total) by mouth at bedtime. 05/30/16   Cloria Spring, MD  valACYclovir (VALTREX) 1000 MG tablet Take 1 tablet (1,000 mg total) by mouth 2 (two) times daily. 05/16/16   Estill Dooms, NP    Allergies    Erythromycin  Review of Systems   Review of Systems  Unable to perform ROS: Mental status change    Physical Exam Updated Vital Signs BP (!) 164/98   Pulse 84   Temp 97.6 F (36.4 C) (Oral)   Resp 20   Ht 5\' 1"  (1.549 m)   Wt 45.4 kg   SpO2 98%   BMI 18.89 kg/m   Physical Exam Vitals and nursing note reviewed.  Constitutional:      Appearance: Normal appearance.  HENT:     Head: Atraumatic.  Eyes:     Extraocular Movements: Extraocular movements intact.     Pupils: Pupils are equal, round, and reactive to light.  Cardiovascular:     Rate and Rhythm: Tachycardia present.  Pulmonary:     Breath sounds: No wheezing or rhonchi.  Abdominal:     Tenderness: There is no abdominal tenderness.  Musculoskeletal:     Cervical back: Neck supple.  Skin:    General: Skin is warm.     Capillary Refill: Capillary refill takes less than 2 seconds.  Neurological:     Comments: Awake and can tell me her name.  Moves all extremities.  When asked to raise her right arm up she raise her left arm up.  When asked again to raise her right arm up she raise both arms up.  Could not tell the name of the building we are in.     ED Results / Procedures / Treatments   Labs (all labs ordered are listed, but only abnormal results are displayed) Labs Reviewed  COMPREHENSIVE METABOLIC PANEL - Abnormal; Notable for the following components:      Result  Value   Sodium 128 (*)    Chloride 92 (*)    CO2 16 (*)  Glucose, Bld 131 (*)    Creatinine, Ser 1.20 (*)    Total Protein 8.8 (*)    AST 42 (*)    GFR calc non Af Amer 50 (*)    GFR calc Af Amer 58 (*)    Anion gap 20 (*)    All other components within normal limits  CBC WITH DIFFERENTIAL/PLATELET - Abnormal; Notable for the following components:   RBC 5.85 (*)    Hemoglobin 17.3 (*)    HCT 52.3 (*)    All other components within normal limits  URINALYSIS, ROUTINE W REFLEX MICROSCOPIC - Abnormal; Notable for the following components:   APPearance HAZY (*)    Hgb urine dipstick MODERATE (*)    Ketones, ur 80 (*)    Protein, ur 100 (*)    Leukocytes,Ua SMALL (*)    Bacteria, UA RARE (*)    All other components within normal limits  RAPID URINE DRUG SCREEN, HOSP PERFORMED - Abnormal; Notable for the following components:   Cocaine POSITIVE (*)    All other components within normal limits  ETHANOL    EKG EKG Interpretation  Date/Time:  Saturday August 09 2019 19:41:45 EDT Ventricular Rate:  120 PR Interval:    QRS Duration: 81 QT Interval:  347 QTC Calculation: 491 R Axis:   77 Text Interpretation: Sinus tachycardia Consider right atrial enlargement Consider left ventricular hypertrophy Borderline prolonged QT interval Confirmed by Davonna Belling [phone]) on 08/09/2019 8:08:24 PM   Radiology CT Head Wo Contrast  Result Date: 08/09/2019 CLINICAL DATA:  Altered mental status EXAM: CT HEAD WITHOUT CONTRAST TECHNIQUE: Contiguous axial images were obtained from the base of the skull through the vertex without intravenous contrast. COMPARISON:  None. FINDINGS: Brain: No evidence of acute infarction, hemorrhage, hydrocephalus, extra-axial collection or mass lesion/mass effect. Vascular: No hyperdense vessel or unexpected calcification. Skull: Normal. Negative for fracture or focal lesion. Sinuses/Orbits: No acute finding. Other: None. IMPRESSION: Normal head CT for age  Electronically Signed   By: Inez Catalina M.D.   On: 08/09/2019 20:03   DG Chest Portable 1 View  Result Date: 08/09/2019 CLINICAL DATA:  Altered mental status EXAM: PORTABLE CHEST 1 VIEW COMPARISON:  08/05/2014 FINDINGS: Cardiac shadow is within normal limits. Lungs are well aerated bilaterally. No focal infiltrate or sizable effusion is seen. No bony abnormality is noted. IMPRESSION: No active disease. Electronically Signed   By: Inez Catalina M.D.   On: 08/09/2019 20:27    Procedures Procedures (including critical care time)  Medications Ordered in ED Medications  lactated ringers bolus 1,000 mL (0 mLs Intravenous Stopped 08/09/19 2314)    ED Course  I have reviewed the triage vital signs and the nursing notes.  Pertinent labs & imaging results that were available during my care of the patient were reviewed by me and considered in my medical decision making (see chart for details).    MDM Rules/Calculators/A&P                      Patient presents with mental status change.  Reportedly had some shaking at home.  Patient was awake but could not provide much history.  Lab work done and shows mild renal insufficiency.  Mild hyponatremia.  Head CT done reassuring.  X-ray reassuring.  Urine drug screen however did show cocaine.  Patient later admitted doing large amounts of cocaine yesterday and today.  Mental status improving able to provide more history now.  Urine shows some white cells but  only rare bacteria.  I think likely encephalopathy was from drug use.  We will continue some monitoring but I think patient will be likely to be discharged home.   Patient is continued to improve.  Will discharge now.  Has ambulated to the bathroom. Final Clinical Impression(s) / ED Diagnoses Final diagnoses:  Substance use disorder  Cocaine abuse [facility])    Rx / [place] Orders ED Discharge Orders    None       Davonna Belling, MD 08/09/19 2256    Davonna Belling, MD 08/09/19 2315

## 2019-08-09 NOTE — ED Notes (Signed)
Pt admits to using "crack" yesterday.

## 2020-05-05 ENCOUNTER — Telehealth: Payer: Self-pay | Admitting: Internal Medicine

## 2020-05-05 ENCOUNTER — Other Ambulatory Visit (HOSPITAL_COMMUNITY): Payer: Self-pay | Admitting: Internal Medicine

## 2020-05-05 DIAGNOSIS — Z1231 Encounter for screening mammogram for malignant neoplasm of breast: Secondary | ICD-10-CM

## 2020-05-05 NOTE — Telephone Encounter (Signed)
Patient used to come here and moved to [place]...she has moved back here and needs to be seen.  Last tcs several years back.  Does she need an office or nurse visit?  Patient pcp called here to ask

## 2020-05-05 NOTE — Telephone Encounter (Signed)
Pt will need ov to schedule tcs. Due to meds and +cocaine a few months ago.

## 2020-05-05 NOTE — Telephone Encounter (Signed)
PATIENT SCHEDULED AND IS AWARE OF APPOINTMENT  °

## 2020-05-19 ENCOUNTER — Ambulatory Visit (HOSPITAL_COMMUNITY): Payer: Medicare Other

## 2020-05-19 ENCOUNTER — Encounter (HOSPITAL_COMMUNITY): Payer: Self-pay

## 2020-05-19 ENCOUNTER — Emergency Department (HOSPITAL_COMMUNITY)
Admission: EM | Admit: 2020-05-19 | Discharge: 2020-05-19 | Disposition: A | Discharge status: Home / Self Care | Payer: Medicare Other | Attending: Emergency Medicine | Admitting: Emergency Medicine

## 2020-05-19 ENCOUNTER — Other Ambulatory Visit: Payer: Self-pay

## 2020-05-19 ENCOUNTER — Emergency Department (HOSPITAL_COMMUNITY): Payer: Medicare Other

## 2020-05-19 DIAGNOSIS — Z5321 Procedure and treatment not carried out due to patient leaving prior to being seen by health care provider: Secondary | ICD-10-CM | POA: Diagnosis not present

## 2020-05-19 DIAGNOSIS — R202 Paresthesia of skin: Secondary | ICD-10-CM | POA: Diagnosis present

## 2020-05-19 LAB — BASIC METABOLIC PANEL
Anion gap: 7 (ref 5–15)
BUN: 23 mg/dL — ABNORMAL HIGH (ref 6–20)
CO2: 25 mmol/L (ref 22–32)
Calcium: 9.5 mg/dL (ref 8.9–10.3)
Chloride: 105 mmol/L (ref 98–111)
Creatinine, Ser: 1.18 mg/dL — ABNORMAL HIGH (ref 0.44–1.00)
GFR, Estimated: 53 mL/min — ABNORMAL LOW (ref >60)
Glucose, Bld: 94 mg/dL (ref 70–99)
Potassium: 4.5 mmol/L (ref 3.5–5.1)
Sodium: 137 mmol/L (ref 135–145)

## 2020-05-19 LAB — CBC
HCT: 44.2 % (ref 36.0–46.0)
Hemoglobin: 14.2 g/dL (ref 12.0–15.0)
MCH: 30 pg (ref 26.0–34.0)
MCHC: 32.1 g/dL (ref 30.0–36.0)
MCV: 93.2 fL (ref 80.0–100.0)
Platelets: 252 10*3/uL (ref 150–400)
RBC: 4.74 MIL/uL (ref 3.87–5.11)
RDW: 12.3 % (ref 11.5–15.5)
WBC: 5.9 10*3/uL (ref 4.0–10.5)
nRBC: 0 % (ref 0.0–0.2)

## 2020-05-19 LAB — TROPONIN I (HIGH SENSITIVITY)
Troponin I (High Sensitivity): 8 ng/L (ref <18)
Troponin I (High Sensitivity): 8 ng/L (ref <18)

## 2020-05-19 NOTE — ED Triage Notes (Signed)
Pt presents to ED with complaints of left arm pressure and numbness comes and goes for the last month. Pt states it happened last night and she became weak and diaphoretic. Pt states the pain comes out of no where and happened again this morning but no diaphoresis this am.

## 2020-05-26 ENCOUNTER — Ambulatory Visit: Payer: Medicare Other | Admitting: Gastroenterology

## 2020-05-26 ENCOUNTER — Encounter: Payer: Self-pay | Admitting: Internal Medicine

## 2020-06-02 ENCOUNTER — Other Ambulatory Visit: Payer: Self-pay

## 2020-06-02 ENCOUNTER — Ambulatory Visit (HOSPITAL_COMMUNITY)
Admission: RE | Admit: 2020-06-02 | Discharge: 2020-06-02 | Disposition: A | Discharge status: Home / Self Care | Payer: Medicare Other | Source: Ambulatory Visit | Attending: Internal Medicine | Admitting: Internal Medicine

## 2020-06-02 DIAGNOSIS — Z1231 Encounter for screening mammogram for malignant neoplasm of breast: Secondary | ICD-10-CM | POA: Diagnosis present

## 2020-06-03 ENCOUNTER — Encounter: Payer: Medicare Other | Admitting: Adult Health

## 2020-06-14 ENCOUNTER — Other Ambulatory Visit (HOSPITAL_COMMUNITY)
Admission: RE | Admit: 2020-06-14 | Discharge: 2020-06-14 | Disposition: A | Discharge status: Home / Self Care | Payer: Medicare Other | Source: Ambulatory Visit | Attending: Adult Health | Admitting: Adult Health

## 2020-06-14 ENCOUNTER — Other Ambulatory Visit: Payer: Self-pay

## 2020-06-14 ENCOUNTER — Ambulatory Visit (INDEPENDENT_AMBULATORY_CARE_PROVIDER_SITE_OTHER): Payer: Medicare Other | Admitting: Adult Health

## 2020-06-14 ENCOUNTER — Encounter: Payer: Self-pay | Admitting: Adult Health

## 2020-06-14 VITALS — BP 114/73 | HR 83 | Ht 62.5 in | Wt 94.5 lb

## 2020-06-14 DIAGNOSIS — R87810 Cervical high risk human papillomavirus (HPV) DNA test positive: Secondary | ICD-10-CM | POA: Diagnosis not present

## 2020-06-14 DIAGNOSIS — F17200 Nicotine dependence, unspecified, uncomplicated: Secondary | ICD-10-CM

## 2020-06-14 DIAGNOSIS — Z780 Asymptomatic menopausal state: Secondary | ICD-10-CM | POA: Diagnosis not present

## 2020-06-14 DIAGNOSIS — Z1151 Encounter for screening for human papillomavirus (HPV): Secondary | ICD-10-CM | POA: Insufficient documentation

## 2020-06-14 DIAGNOSIS — R87612 Low grade squamous intraepithelial lesion on cytologic smear of cervix (LGSIL): Secondary | ICD-10-CM | POA: Diagnosis not present

## 2020-06-14 DIAGNOSIS — Z01419 Encounter for gynecological examination (general) (routine) without abnormal findings: Secondary | ICD-10-CM | POA: Insufficient documentation

## 2020-06-14 DIAGNOSIS — Z1211 Encounter for screening for malignant neoplasm of colon: Secondary | ICD-10-CM | POA: Diagnosis not present

## 2020-06-14 DIAGNOSIS — Z0000 Encounter for general adult medical examination without abnormal findings: Secondary | ICD-10-CM

## 2020-06-14 LAB — HEMOCCULT GUIAC POC 1CARD (OFFICE): Fecal Occult Blood, POC: NEGATIVE

## 2020-06-14 NOTE — Progress Notes (Signed)
  Subjective:     Patient ID: Brittany Lester, female   DOB: 1960-11-25, 60 y.o.   MRN: [id number]  HPI [name] is a 60 year old white female, single, PM in for pap and pelvic exam. PCP is Dr Legrand Rams.   Review of Systems Patient denies any headaches, hearing loss, fatigue, blurred vision, shortness of breath, chest pain(had some about 4 weeks ago and went to ER but left after 7 hours), abdominal pain, problems with bowel movements, urination, or intercourse. No joint pain or mood swings. Has had recent herpes outbreak,taking Valtrex   Reviewed past medical,surgical, social and family history. Reviewed medications and allergies.     Objective:   Physical Exam BP 114/73 (BP Location: Left Arm, Patient Position: Sitting, Cuff Size: Small)   Pulse 83   Ht 5' 2.5" (1.588 m)   Wt 94 lb 8 oz (42.9 kg)   BMI 17.01 kg/m   Skin warm and dry.Pelvic: external genitalia is normal in appearance no lesions, vagina: pale with loss of moisture and rugae,urethra has no lesions or masses noted, cervix:smooth, pap with GC/CHL and HRHPV performed, uterus: normal size, shape and contour, non tender, no masses felt, adnexa: no masses or tenderness noted. Bladder is non tender and no masses felt.  On rectal exam, has good tone, no masses felt and hemoccult was negative.  Psych: She seems happy and is alert  And cooperative. AA is 10 Fall risk is low PHQ 9 score is 13, denies any SI,has history of bipolar GAD 7 score is 8   Examination chaperoned by Levy Pupa LPN   [facility] - 10/29/74 1511      Pregnancy Intention Screening   Does the patient want to become pregnant in the next year? N/A    Does the patient's partner want to become pregnant in the next year? N/A    Would the patient like to discuss contraceptive options today? N/A      Contraception Wrap Up   Current Method No Method - Other Reason   postmenopausal   End Method No Method - Other Reason   postmenopausal   Contraception Counseling  Provided No          Assessment:     1. Routine medical exam Pap sent ' 2. Encounter for gynecological examination with Papanicolaou smear of cervix Pap sent Physical and labs with PCP Pap in 3 years if normal Mammogram yearly had negative mammogram 06/02/20 Colonoscopy ?due, seeing Roseanne Kaufman NP at [facility] 07/06/20  Follow up with PCP   3. Encounter for screening fecal occult blood testing   4. Smoker She may try OTC patches  5. Postmenopause    Plan:     Pap in 3 years if normal

## 2020-06-23 LAB — CYTOLOGY - PAP
Chlamydia: NEGATIVE
Comment: NEGATIVE
Comment: NEGATIVE
Comment: NEGATIVE
Comment: NORMAL
HPV 16: NEGATIVE
HPV 18 / 45: NEGATIVE
High risk HPV: POSITIVE — AB
Neisseria Gonorrhea: NEGATIVE

## 2020-06-24 ENCOUNTER — Telehealth: Payer: Self-pay | Admitting: Adult Health

## 2020-06-24 ENCOUNTER — Encounter: Payer: Self-pay | Admitting: Adult Health

## 2020-06-24 DIAGNOSIS — R87612 Low grade squamous intraepithelial lesion on cytologic smear of cervix (LGSIL): Secondary | ICD-10-CM

## 2020-06-24 DIAGNOSIS — R87619 Unspecified abnormal cytological findings in specimens from cervix uteri: Secondary | ICD-10-CM | POA: Insufficient documentation

## 2020-06-24 HISTORY — DX: Low grade squamous intraepithelial lesion on cytologic smear of cervix (LGSIL): R87.612

## 2020-06-24 NOTE — Telephone Encounter (Signed)
Pt aware that pap was LSIL +HPV will get colpo, appt made 07/12/20 with Maudie Mercury.

## 2020-06-30 ENCOUNTER — Telehealth: Payer: Self-pay

## 2020-06-30 MED ORDER — HYDROXYZINE PAMOATE 25 MG PO CAPS: 25.0000 mg | ORAL_CAPSULE | Freq: Three times a day (TID) | ORAL | 0 refills | Status: DC | PRN

## 2020-06-30 NOTE — Telephone Encounter (Signed)
Will rx vistaril, can take 20 minutes before appt and bring 1 with her to appt and have a driver

## 2020-06-30 NOTE — Telephone Encounter (Signed)
Pt is wanting to know if she can have something called into the pharmacy to help keep her calm on the day of her Colpo.

## 2020-07-06 ENCOUNTER — Ambulatory Visit: Payer: Medicare Other | Admitting: Gastroenterology

## 2020-07-06 ENCOUNTER — Encounter: Payer: Self-pay | Admitting: Internal Medicine

## 2020-07-06 NOTE — Progress Notes (Deleted)
History of chronic Hep C s/p therapy with Harvoni in 2016. SVR achieved. Colonoscopy 2015 with adenomas.

## 2020-07-12 ENCOUNTER — Other Ambulatory Visit: Payer: Self-pay | Admitting: Women's Health

## 2020-07-12 ENCOUNTER — Encounter: Payer: Self-pay | Admitting: Women's Health

## 2020-07-12 ENCOUNTER — Ambulatory Visit (INDEPENDENT_AMBULATORY_CARE_PROVIDER_SITE_OTHER): Payer: Medicare Other | Admitting: Women's Health

## 2020-07-12 ENCOUNTER — Other Ambulatory Visit: Payer: Self-pay

## 2020-07-12 VITALS — BP 107/76 | HR 73 | Ht 62.0 in | Wt 97.0 lb

## 2020-07-12 DIAGNOSIS — R87612 Low grade squamous intraepithelial lesion on cytologic smear of cervix (LGSIL): Secondary | ICD-10-CM | POA: Diagnosis not present

## 2020-07-12 NOTE — Patient Instructions (Signed)
Endocervical Curettage, Care After This sheet gives you information about how to care for yourself after your procedure. Your health care provider may also give you more specific instructions. If you have problems or questions, contact your health care provider. What can I expect after the procedure? After the procedure, it is common to have:  Some mild cramping and pain. Your health care provider will give you medicines to control this.  A small amount of vaginal bleeding (spotting) for a few days. Follow these instructions at home: Activity  Limit your physical activity for the first day after the procedure as told by your health care provider. Ask your health care provider what activities are safe for you.  Do not douche, use tampons, or have sex for at least 2 weeks, or as told by your health care provider. General instructions  Take over-the-counter and prescription medicines only as told by your health care provider. Do not take aspirin. It can cause bleeding.  Do not take baths, swim, or use a hot tub until your health care provider approves. Take showers instead of baths.  It is up to you to get the results of your procedure. Ask your health care provider, or the department that is doing the procedure, when your results will be ready.  Keep all follow-up visits as told by your health care provider. This is important. Contact a health care provider if:  You have pain that does not get better with medicine.  You have abnormal, heavy, or bad smelling vaginal discharge.  You have a skin rash. Get help right away if:  You have heavy vaginal bleeding.  You have a fever or chills.  You feel lightheaded, weak, or faint.  You have shortness of breath. Summary  It is common to have mild abdominal cramping and a small amount of vaginal bleeding after the procedure.  Limit your physical activities for the first day after the procedure. Ask your health care provider what  activities are safe for you.  Call your health care provider if you have heavy vaginal bleeding, fever, chills, or shortness of breath. This information is not intended to replace advice given to you by your health care provider. Make sure you discuss any questions you have with your health care provider. Document Revised: 04/06/2017 Document Reviewed: 05/29/2016 Elsevier Patient Education  2021 [employer].

## 2020-07-12 NOTE — Progress Notes (Signed)
   COLPOSCOPY PROCEDURE NOTE Patient name: Brittany Lester MRN [id number]  Date of birth: 02/25/1961 Subjective Findings:   Brittany Lester is a 60 y.o. G6P2002 Caucasian female being seen today for a colposcopy.  Has HTN, has taken med today, very anxious.  Indication: Abnormal pap: LSIL w/ HRHPV positive on 06/14/20 Prior cytology:  Date Result Procedure  2016 NILM w/ HRHPV negative None              No LMP recorded. Patient is postmenopausal. Contraception: post menopausal status. Menopausal: yes @60yo . Hysterectomy: no.   Smoker: Yes.  Immunocompromised: no.  The risks and benefits were explained and informed consent was obtained, and written copy is in chart. Pertinent History Reviewed:   Reviewed past medical,surgical, social, obstetrical and family history.  Reviewed problem list, medications and allergies. Objective Findings & Procedure:   Vitals:   07/12/20 1519 07/12/20 1523 07/12/20 1641  BP: (!) 185/119 (!) 177/120 107/76  Pulse: (!) 105 (!) 103 73  Weight: 97 lb (44 kg)    Height: 5\' 2"  (1.575 m)    Body mass index is 17.74 kg/m.  No results found for this or any previous visit (from the past 24 hour(s)).   Time out was performed.  Speculum placed in the vagina, cervix fully visualized. SCJ: not fully visualized Cervix swabbed x 3 with acetic acid.  Acetowhitening present: No Cervix: no visible lesions, no mosaicism, no punctation and no abnormal vasculature. ECC performed d/t inability to adequately visualize SCJ. Monsels dabbed at os.  Vagina: vaginal colposcopy not performed Vulva: vulvar colposcopy not performed  Specimens: scraping from ECC in 1 container  Complications: none  Preceptor: Dr. Elonda Husky Colposcopic Impression & Plan:   Inadequate colpo d/t inability to visualize SCJ, ECC performed, will call pt w/ results   Return for prn.  [place], [facility] 07/12/2020 5:16 PM

## 2020-07-26 ENCOUNTER — Encounter: Payer: Self-pay | Admitting: Women's Health

## 2020-07-27 ENCOUNTER — Other Ambulatory Visit (HOSPITAL_COMMUNITY): Payer: Self-pay | Admitting: Gerontology

## 2020-07-27 ENCOUNTER — Ambulatory Visit (HOSPITAL_COMMUNITY)
Admission: RE | Admit: 2020-07-27 | Discharge: 2020-07-27 | Disposition: A | Discharge status: Home / Self Care | Payer: Medicare Other | Source: Ambulatory Visit | Attending: Gerontology | Admitting: Gerontology

## 2020-07-27 ENCOUNTER — Other Ambulatory Visit: Payer: Self-pay

## 2020-07-27 DIAGNOSIS — R059 Cough, unspecified: Secondary | ICD-10-CM | POA: Diagnosis present

## 2020-11-25 ENCOUNTER — Other Ambulatory Visit: Payer: Self-pay

## 2020-11-25 ENCOUNTER — Emergency Department (HOSPITAL_COMMUNITY): Payer: Medicare Other

## 2020-11-25 ENCOUNTER — Observation Stay (HOSPITAL_COMMUNITY)
Admission: EM | Admit: 2020-11-25 | Discharge: 2020-11-26 | Disposition: A | Discharge status: Home / Self Care | Payer: Medicare Other | Attending: Internal Medicine | Admitting: Internal Medicine

## 2020-11-25 ENCOUNTER — Encounter (HOSPITAL_COMMUNITY): Payer: Self-pay | Admitting: *Deleted

## 2020-11-25 DIAGNOSIS — E7800 Pure hypercholesterolemia, unspecified: Principal | ICD-10-CM | POA: Diagnosis present

## 2020-11-25 DIAGNOSIS — Y900 Blood alcohol level of less than 20 mg/100 ml: Secondary | ICD-10-CM | POA: Insufficient documentation

## 2020-11-25 DIAGNOSIS — Z20822 Contact with and (suspected) exposure to covid-19: Secondary | ICD-10-CM | POA: Insufficient documentation

## 2020-11-25 DIAGNOSIS — Z8673 Personal history of transient ischemic attack (TIA), and cerebral infarction without residual deficits: Secondary | ICD-10-CM | POA: Insufficient documentation

## 2020-11-25 DIAGNOSIS — N3000 Acute cystitis without hematuria: Secondary | ICD-10-CM | POA: Insufficient documentation

## 2020-11-25 DIAGNOSIS — F1410 Cocaine abuse, uncomplicated: Secondary | ICD-10-CM

## 2020-11-25 DIAGNOSIS — F1910 Other psychoactive substance abuse, uncomplicated: Secondary | ICD-10-CM | POA: Diagnosis not present

## 2020-11-25 DIAGNOSIS — R200 Anesthesia of skin: Secondary | ICD-10-CM | POA: Diagnosis present

## 2020-11-25 DIAGNOSIS — I639 Cerebral infarction, unspecified: Secondary | ICD-10-CM | POA: Diagnosis present

## 2020-11-25 DIAGNOSIS — I63532 Cerebral infarction due to unspecified occlusion or stenosis of left posterior cerebral artery: Secondary | ICD-10-CM

## 2020-11-25 DIAGNOSIS — F17200 Nicotine dependence, unspecified, uncomplicated: Secondary | ICD-10-CM | POA: Diagnosis not present

## 2020-11-25 DIAGNOSIS — I10 Essential (primary) hypertension: Secondary | ICD-10-CM | POA: Diagnosis not present

## 2020-11-25 DIAGNOSIS — Z79899 Other long term (current) drug therapy: Secondary | ICD-10-CM | POA: Insufficient documentation

## 2020-11-25 DIAGNOSIS — F17210 Nicotine dependence, cigarettes, uncomplicated: Secondary | ICD-10-CM | POA: Diagnosis not present

## 2020-11-25 DIAGNOSIS — R29898 Other symptoms and signs involving the musculoskeletal system: Secondary | ICD-10-CM

## 2020-11-25 LAB — RAPID URINE DRUG SCREEN, HOSP PERFORMED
Amphetamines: NOT DETECTED
Barbiturates: NOT DETECTED
Benzodiazepines: NOT DETECTED
Cocaine: POSITIVE — AB
Opiates: NOT DETECTED
Tetrahydrocannabinol: NOT DETECTED

## 2020-11-25 LAB — I-STAT CHEM 8, ED
BUN: 20 mg/dL (ref 6–20)
Calcium, Ion: 1.08 mmol/L — ABNORMAL LOW (ref 1.15–1.40)
Chloride: 104 mmol/L (ref 98–111)
Creatinine, Ser: 0.7 mg/dL (ref 0.44–1.00)
Glucose, Bld: 94 mg/dL (ref 70–99)
HCT: 41 % (ref 36.0–46.0)
Hemoglobin: 13.9 g/dL (ref 12.0–15.0)
Potassium: 4 mmol/L (ref 3.5–5.1)
Sodium: 136 mmol/L (ref 135–145)
TCO2: 26 mmol/L (ref 22–32)

## 2020-11-25 LAB — URINALYSIS, ROUTINE W REFLEX MICROSCOPIC
Bilirubin Urine: NEGATIVE
Glucose, UA: NEGATIVE mg/dL
Ketones, ur: NEGATIVE mg/dL
Nitrite: NEGATIVE
Protein, ur: NEGATIVE mg/dL
Specific Gravity, Urine: 1.012 (ref 1.005–1.030)
WBC, UA: >50 WBC/hpf — ABNORMAL HIGH (ref 0–5)
pH: 6 (ref 5.0–8.0)

## 2020-11-25 LAB — CBC
HCT: 40.3 % (ref 36.0–46.0)
Hemoglobin: 13.5 g/dL (ref 12.0–15.0)
MCH: 31.3 pg (ref 26.0–34.0)
MCHC: 33.5 g/dL (ref 30.0–36.0)
MCV: 93.5 fL (ref 80.0–100.0)
Platelets: 218 10*3/uL (ref 150–400)
RBC: 4.31 MIL/uL (ref 3.87–5.11)
RDW: 12.8 % (ref 11.5–15.5)
WBC: 6 10*3/uL (ref 4.0–10.5)
nRBC: 0 % (ref 0.0–0.2)

## 2020-11-25 LAB — COMPREHENSIVE METABOLIC PANEL
ALT: 29 U/L (ref 0–44)
AST: 35 U/L (ref 15–41)
Albumin: 4.2 g/dL (ref 3.5–5.0)
Alkaline Phosphatase: 76 U/L (ref 38–126)
Anion gap: 7 (ref 5–15)
BUN: 21 mg/dL — ABNORMAL HIGH (ref 6–20)
CO2: 26 mmol/L (ref 22–32)
Calcium: 9.4 mg/dL (ref 8.9–10.3)
Chloride: 102 mmol/L (ref 98–111)
Creatinine, Ser: 0.78 mg/dL (ref 0.44–1.00)
GFR, Estimated: >60 mL/min (ref >60)
Glucose, Bld: 95 mg/dL (ref 70–99)
Potassium: 4 mmol/L (ref 3.5–5.1)
Sodium: 135 mmol/L (ref 135–145)
Total Bilirubin: 0.8 mg/dL (ref 0.3–1.2)
Total Protein: 7.8 g/dL (ref 6.5–8.1)

## 2020-11-25 LAB — PROTIME-INR
INR: 0.9 (ref 0.8–1.2)
Prothrombin Time: 12.1 seconds (ref 11.4–15.2)

## 2020-11-25 LAB — DIFFERENTIAL
Abs Immature Granulocytes: 0.01 10*3/uL (ref 0.00–0.07)
Basophils Absolute: 0.1 10*3/uL (ref 0.0–0.1)
Basophils Relative: 1 %
Eosinophils Absolute: 0.2 10*3/uL (ref 0.0–0.5)
Eosinophils Relative: 3 %
Immature Granulocytes: 0 %
Lymphocytes Relative: 29 %
Lymphs Abs: 1.7 10*3/uL (ref 0.7–4.0)
Monocytes Absolute: 0.6 10*3/uL (ref 0.1–1.0)
Monocytes Relative: 10 %
Neutro Abs: 3.4 10*3/uL (ref 1.7–7.7)
Neutrophils Relative %: 57 %

## 2020-11-25 LAB — ETHANOL: Alcohol, Ethyl (B): <10 mg/dL (ref <10)

## 2020-11-25 LAB — RESP PANEL BY RT-PCR (FLU A&B, COVID) ARPGX2
Influenza A by PCR: NEGATIVE
Influenza B by PCR: NEGATIVE
SARS Coronavirus 2 by RT PCR: NEGATIVE

## 2020-11-25 LAB — APTT: aPTT: 28 seconds (ref 24–36)

## 2020-11-25 MED ORDER — FOLIC ACID 1 MG PO TABS
1.0000 mg | ORAL_TABLET | Freq: Every day | ORAL | Status: DC
  Administered 2020-11-26: 1 mg via ORAL
  Filled 2020-11-25: qty 1

## 2020-11-25 MED ORDER — THIAMINE HCL 100 MG PO TABS: 100.0000 mg | ORAL_TABLET | Freq: Every day | ORAL | Status: DC

## 2020-11-25 MED ORDER — ACETAMINOPHEN 160 MG/5ML PO SOLN: 650.0000 mg | ORAL | Status: DC | PRN

## 2020-11-25 MED ORDER — ALBUTEROL SULFATE (2.5 MG/3ML) 0.083% IN NEBU: 2.5000 mg | INHALATION_SOLUTION | RESPIRATORY_TRACT | Status: DC | PRN

## 2020-11-25 MED ORDER — FLUTICASONE FUROATE-VILANTEROL 100-25 MCG/INH IN AEPB
1.0000 | INHALATION_SPRAY | Freq: Every day | RESPIRATORY_TRACT | Status: DC
  Administered 2020-11-26: 1 via RESPIRATORY_TRACT
  Filled 2020-11-25: qty 28

## 2020-11-25 MED ORDER — ACETAMINOPHEN 325 MG PO TABS: 650.0000 mg | ORAL_TABLET | ORAL | Status: DC | PRN

## 2020-11-25 MED ORDER — ACETAMINOPHEN 650 MG RE SUPP: 650.0000 mg | RECTAL | Status: DC | PRN

## 2020-11-25 MED ORDER — ALBUTEROL SULFATE HFA 108 (90 BASE) MCG/ACT IN AERS: 1.0000 | INHALATION_SPRAY | RESPIRATORY_TRACT | Status: DC | PRN

## 2020-11-25 MED ORDER — CLOPIDOGREL BISULFATE 75 MG PO TABS
300.0000 mg | ORAL_TABLET | Freq: Once | ORAL | Status: AC
  Administered 2020-11-25: 300 mg via ORAL
  Filled 2020-11-25: qty 4

## 2020-11-25 MED ORDER — HEPARIN SODIUM (PORCINE) 5000 UNIT/ML IJ SOLN
5000.0000 [IU] | Freq: Three times a day (TID) | INTRAMUSCULAR | Status: DC
  Administered 2020-11-25 – 2020-11-26 (×3): 5000 [IU] via SUBCUTANEOUS
  Filled 2020-11-25 (×3): qty 1

## 2020-11-25 MED ORDER — CLOPIDOGREL BISULFATE 75 MG PO TABS
75.0000 mg | ORAL_TABLET | Freq: Every day | ORAL | Status: DC
  Administered 2020-11-26: 75 mg via ORAL
  Filled 2020-11-25: qty 1

## 2020-11-25 MED ORDER — SENNOSIDES-DOCUSATE SODIUM 8.6-50 MG PO TABS: 1.0000 | ORAL_TABLET | Freq: Every evening | ORAL | Status: DC | PRN

## 2020-11-25 MED ORDER — HYDROXYZINE HCL 25 MG PO TABS
25.0000 mg | ORAL_TABLET | Freq: Three times a day (TID) | ORAL | Status: DC | PRN
  Administered 2020-11-25: 25 mg via ORAL
  Filled 2020-11-25: qty 1

## 2020-11-25 MED ORDER — ADULT MULTIVITAMIN W/MINERALS CH
1.0000 | ORAL_TABLET | Freq: Every day | ORAL | Status: DC
  Administered 2020-11-26: 1 via ORAL
  Filled 2020-11-25: qty 1

## 2020-11-25 MED ORDER — LISINOPRIL-HYDROCHLOROTHIAZIDE 20-12.5 MG PO TABS: 1.0000 | ORAL_TABLET | Freq: Every day | ORAL | Status: DC

## 2020-11-25 MED ORDER — LISINOPRIL 10 MG PO TABS
20.0000 mg | ORAL_TABLET | Freq: Every day | ORAL | Status: DC
  Administered 2020-11-26: 20 mg via ORAL
  Filled 2020-11-25: qty 2

## 2020-11-25 MED ORDER — NICOTINE 14 MG/24HR TD PT24
14.0000 mg | MEDICATED_PATCH | Freq: Every day | TRANSDERMAL | Status: DC
  Administered 2020-11-26: 14 mg via TRANSDERMAL
  Filled 2020-11-25: qty 1

## 2020-11-25 MED ORDER — LORAZEPAM 1 MG PO TABS: 1.0000 mg | ORAL_TABLET | ORAL | Status: DC | PRN

## 2020-11-25 MED ORDER — SODIUM CHLORIDE 0.9 % IV SOLN
1.0000 g | INTRAVENOUS | Status: DC
  Administered 2020-11-26: 1 g via INTRAVENOUS
  Filled 2020-11-25: qty 10

## 2020-11-25 MED ORDER — LORAZEPAM 2 MG/ML IJ SOLN: 1.0000 mg | INTRAMUSCULAR | Status: DC | PRN

## 2020-11-25 MED ORDER — STROKE: EARLY STAGES OF RECOVERY BOOK
Freq: Once | Status: AC
  Filled 2020-11-25: qty 1

## 2020-11-25 MED ORDER — SODIUM CHLORIDE 0.9 % IV SOLN
1.0000 g | Freq: Once | INTRAVENOUS | Status: AC
  Administered 2020-11-25: 1 g via INTRAVENOUS
  Filled 2020-11-25: qty 10

## 2020-11-25 MED ORDER — HYDROCHLOROTHIAZIDE 12.5 MG PO CAPS
12.5000 mg | ORAL_CAPSULE | Freq: Every day | ORAL | Status: DC
  Administered 2020-11-26: 12.5 mg via ORAL
  Filled 2020-11-25: qty 1

## 2020-11-25 MED ORDER — HYDROXYZINE PAMOATE 25 MG PO CAPS: 25.0000 mg | ORAL_CAPSULE | Freq: Three times a day (TID) | ORAL | Status: DC | PRN

## 2020-11-25 MED ORDER — ASPIRIN 325 MG PO TABS
325.0000 mg | ORAL_TABLET | Freq: Every day | ORAL | Status: DC
  Administered 2020-11-25 – 2020-11-26 (×2): 325 mg via ORAL
  Filled 2020-11-25 (×2): qty 1

## 2020-11-25 MED ORDER — FLUTICASONE FUROATE-VILANTEROL 100-25 MCG/INH IN AEPB: 1.0000 | INHALATION_SPRAY | Freq: Every day | RESPIRATORY_TRACT | Status: DC

## 2020-11-25 MED ORDER — THIAMINE HCL 100 MG/ML IJ SOLN
100.0000 mg | Freq: Every day | INTRAMUSCULAR | Status: DC
  Administered 2020-11-26: 100 mg via INTRAVENOUS
  Filled 2020-11-25: qty 2

## 2020-11-25 NOTE — ED Provider Notes (Addendum)
[facility] EMERGENCY DEPARTMENT Provider Note   CSN: [id number] Arrival date & time: 11/25/20  1126     History Chief Complaint  Patient presents with   Numbness    Brittany Lester is a 60 y.o. female.  Patient with history of bipolar, hep C, high blood pressure, cigarette smoking use presents with right arm and leg numbness and mild weakness in the leg for the past 3 days.  Constant.  No history of similar.  No known stroke history.  No fevers or chills.  No head injury or headaches.      Past Medical History:  Diagnosis Date   Atrophic vaginitis 07/24/2014   Bipolar disorder ([place])    BV (bacterial vaginosis) 07/24/2014   Hepatitis C    Harvoni   Hepatitis C    Herpes    Hyperlipidemia    Hypertension    LGSIL of cervix of undetermined significance 06/24/2020   2/22 will get Colpo   Lupus ([place])    ???   Medical history non-contributory    Vaginal discharge 07/24/2014    Patient Active Problem List   Diagnosis Date Noted   Abnormal Pap smear of cervix 06/24/2020   Encounter for screening fecal occult blood testing 06/14/2020   Encounter for gynecological examination with Papanicolaou smear of cervix 06/14/2020   Routine medical exam 06/14/2020   Smoker 06/14/2020   Postmenopause 06/14/2020   Constipation 08/03/2014   Current smoker 08/03/2014   HCV infection 08/03/2014   Vaginal discharge 07/24/2014   BV (bacterial vaginosis) 07/24/2014   Atrophic vaginitis 07/24/2014   Loss of weight 01/06/2014   Abdominal pain, unspecified site 01/06/2014   PRURITUS 09/28/2008   TRANSAMINASES, SERUM, ELEVATED 04/07/2008   ECZEMA 03/04/2008   Chronic hepatitis C virus infection ([place]) 12/10/2007   HYPERLIPIDEMIA 12/10/2007   BIPOLAR DISORDER UNSPECIFIED 12/10/2007   TOBACCO ABUSE 12/10/2007   HYPERTENSION 12/10/2007   ALLERGIC RHINITIS 12/10/2007   GERD 12/10/2007   LUPUS 12/10/2007   ARTHRITIS 12/10/2007   LOW BACK PAIN 12/10/2007   Pain in limb 12/10/2007    Past  Surgical History:  Procedure Laterality Date   c section  12/18/1993   CESAREAN SECTION     COLONOSCOPY N/A 12/24/2013   CLE:XNTZGY mucosa in the terminal ileum/TWO colon polyps removed/ The LEFT colon IS redundant/Small internal hemorrhoids. tubular adenoma. next TCS 12/2018   ESOPHAGOGASTRODUODENOSCOPY N/A 02/06/2014   SLF: 1. No obvious reason for weight loss identified 2. Dyspepsia due to MILD erosive gastritis.   FRACTURE SURGERY Right    20 years ago     OB History     Gravida  2   Para  2   Term  2   Preterm      AB      Living  2      SAB      IAB      Ectopic      Multiple      Live Births  2           Family History  Problem Relation Age of Onset   Colon cancer Maternal Aunt        age 85   Heart attack Mother    Thyroid disease Mother    Heart attack Father    Alcohol abuse Father    Depression Father    Heart disease Sister    Thyroid disease Sister    Bipolar disorder Sister    Cancer Sister  lung   Drug abuse Sister    Bipolar disorder Brother    Drug abuse Brother    Drug abuse Son    Liver disease Neg Hx     Social History   Tobacco Use   Smoking status: Every Day    Packs/day: 0.25    Years: 40.00    Pack years: 10.00    Types: Cigarettes   Smokeless tobacco: Never  Vaping Use   Vaping Use: Never used  Substance Use Topics   Alcohol use: Yes    Alcohol/week: 0.0 standard drinks    Comment: beer-daily   Drug use: No    Comment: H/O IVDU, quit 2006    Home Medications Prior to Admission medications   Medication Sig Start Date End Date Taking? Authorizing Provider  albuterol (VENTOLIN HFA) 108 (90 Base) MCG/ACT inhaler Inhale 1-2 puffs into the lungs every 4 (four) hours as needed for wheezing or shortness of breath. 07/29/20  Yes [provider]  BREO ELLIPTA 100-25 MCG/INH AEPB Inhale 1 puff into the lungs daily. 07/21/20  Yes [provider]  hydrOXYzine (VISTARIL) 25 MG capsule Take 1 capsule  (25 mg total) by mouth every 8 (eight) hours as needed. Patient taking differently: Take 25 mg by mouth every 8 (eight) hours as needed for anxiety. 06/30/20  Yes Derrek Monaco A, NP  lisinopril-hydrochlorothiazide (ZESTORETIC) 20-12.5 MG tablet Take 1 tablet by mouth daily. 10/10/20  Yes [provider]    Allergies    Erythromycin  Review of Systems   Review of Systems  Constitutional:  Negative for chills and fever.  HENT:  Negative for congestion.   Eyes:  Negative for visual disturbance.  Respiratory:  Negative for shortness of breath.   Cardiovascular:  Negative for chest pain.  Gastrointestinal:  Negative for abdominal pain and vomiting.  Genitourinary:  Negative for dysuria and flank pain.  Musculoskeletal:  Negative for back pain, neck pain and neck stiffness.  Skin:  Negative for rash.  Neurological:  Positive for weakness and numbness. Negative for light-headedness and headaches.   Physical Exam Updated Vital Signs BP 124/78   Pulse 64   Temp 98 F (36.7 C) (Oral)   Resp 16   Ht 5\' 2"  (1.575 m)   Wt 43.1 kg   SpO2 99%   BMI 17.38 kg/m   Physical Exam Vitals and nursing note reviewed.  Constitutional:      General: She is not in acute distress.    Appearance: She is well-developed.  HENT:     Head: Normocephalic and atraumatic.     Mouth/Throat:     Mouth: Mucous membranes are moist.  Eyes:     General:        Right eye: No discharge.        Left eye: No discharge.     Conjunctiva/sclera: Conjunctivae normal.  Neck:     Trachea: No tracheal deviation.  Cardiovascular:     Rate and Rhythm: Normal rate and regular rhythm.     Heart sounds: No murmur heard. Pulmonary:     Effort: Pulmonary effort is normal.     Breath sounds: Normal breath sounds.  Abdominal:     General: There is no distension.     Palpations: Abdomen is soft.     Tenderness: There is no abdominal tenderness. There is no guarding.  Musculoskeletal:        General: No  swelling.     Cervical back: Normal range of motion and neck  supple. No rigidity.  Skin:    General: Skin is warm.     Capillary Refill: Capillary refill takes less than 2 seconds.     Findings: No rash.  Neurological:     Mental Status: She is alert.     Cranial Nerves: No cranial nerve deficit.     Comments: Patient has decree sensation right arm and right leg compared to left arm and left leg.  Mild weakness in the right leg she needs to use her hand to help lift her leg.  Cranial nerves intact.  Coordination intact.  No facial droop.  No speech changes.  Psychiatric:        Mood and Affect: Mood normal.    ED Results / Procedures / Treatments   Labs (all labs ordered are listed, but only abnormal results are displayed) Labs Reviewed  COMPREHENSIVE METABOLIC PANEL - Abnormal; Notable for the following components:      Result Value   BUN 21 (*)    All other components within normal limits  RAPID URINE DRUG SCREEN, HOSP PERFORMED - Abnormal; Notable for the following components:   Cocaine POSITIVE (*)    All other components within normal limits  URINALYSIS, ROUTINE W REFLEX MICROSCOPIC - Abnormal; Notable for the following components:   APPearance HAZY (*)    Hgb urine dipstick SMALL (*)    Leukocytes,Ua LARGE (*)    WBC, UA >50 (*)    Bacteria, UA MANY (*)    All other components within normal limits  I-STAT CHEM 8, ED - Abnormal; Notable for the following components:   Calcium, Ion 1.08 (*)    All other components within normal limits  RESP PANEL BY RT-PCR (FLU A&B, COVID) ARPGX2  ETHANOL  PROTIME-INR  APTT  CBC  DIFFERENTIAL    EKG None  Radiology No results found.  Procedures Procedures   Medications Ordered in ED Medications  cefTRIAXone (ROCEPHIN) 1 g in sodium chloride 0.9 % 100 mL IVPB (has no administration in time range)    ED Course  I have reviewed the triage vital signs and the nursing notes.  Pertinent labs & imaging results that were  available during my care of the patient were reviewed by me and considered in my medical decision making (see chart for details).    MDM Rules/Calculators/A&P                           Patient presents with right-sided numbness and mild weakness in the right leg clinical concern for stroke or other left-sided brain pathology.  MRI ordered as symptoms for 3 days and out of any time window for tPA.  MRI pending.  General blood work ordered and reviewed electrolytes kidney function and hemoglobin overall unremarkable.  Urinalysis consistent with infection Rocephin ordered.  Patient will be signed out to follow-up MRI results for final disposition. Urinalysis also showed positive cocaine use. EKG reviewed sinus rhythm, no acute ST elevation. Final Clinical Impression(s) / ED Diagnoses Final diagnoses:  Right arm numbness  Right leg weakness  Cocaine abuse ([place])  Acute cystitis without hematuria    Rx / DC Orders ED Discharge Orders     None        Elnora Morrison, MD 11/25/20 1624    Elnora Morrison, MD 11/25/20 1625

## 2020-11-25 NOTE — ED Provider Notes (Signed)
Pt signed out by Dr. Reather Converse pending MRI/MRA.  IMPRESSION:  MRI:     1. Acute left thalamic infarct. Associated edema without significant  mass effect.  2. Mild chronic microvascular ischemic disease.  3. Moderate left maxillary sinus mucosal thickening.     MRA:     1. Short segment severe stenosis versus occlusion of the left P2 PCA  with flow related signal in the more distal PCA.  2. Moderate stenosis of the right P2 PCA.  3. Possible moderate stenosis of a right M2 MCA branch.   Pt d/w Dr. Leonel Ramsay who recommended starting pt on ASA and high dose Plavix (300 mg).  She can stay here and see Dr. Merlene Laughter in the morning.    Pt encouraged to avoid cocaine.  Pt d/w Dr. Waldron Labs (triad) for admission.  CRITICAL CARE Performed by: Isla Pence   Total critical care time: 30 minutes  Critical care time was exclusive of separately billable procedures and treating other patients.  Critical care was necessary to treat or prevent imminent or life-threatening deterioration.  Critical care was time spent personally by me on the following activities: development of treatment plan with patient and/or surrogate as well as nursing, discussions with consultants, evaluation of patient's response to treatment, examination of patient, obtaining history from patient or surrogate, ordering and performing treatments and interventions, ordering and review of laboratory studies, ordering and review of radiographic studies, pulse oximetry and re-evaluation of patient's condition.      Isla Pence, MD 11/25/20 [phone]

## 2020-11-25 NOTE — ED Triage Notes (Signed)
Numbness in right side for 3 days

## 2020-11-25 NOTE — H&P (Signed)
TRH H&P    Patient Demographics:    Brittany Lester, is a 60 y.o. female  MRN: [id number]  DOB - [birth year]  Admit Date - 11/25/2020  Referring MD/NP/PA: Gilford Raid  Outpatient Primary MD for the patient is Rosita Fire, MD  Patient coming from: Home  Chief complaint- right sided weakness   HPI:    Brittany Lester  is a 60 y.o. female, with history of bipolar disorder, substance abuse, hepatitis C, hyperlipidemia, hypertension, lupus, and more presents the ED with a chief complaint of right-sided weakness.  This is a right-hand-dominant female who reports that she woke up with a stiff neck 3 days ago and that day she had numbness in her right face, right upper extremity, and right lower extremity.  She had no difficulty with speech, swallowing, blurry vision, or dizziness.  She reports that she is able to walk she just has to drag her right foot.  She reports that she has tried to grip things like cups and other household items with her right hand but she drops them.  This is partially because of weakness and partially because she cannot feel the item in her hand.  The right-sided weakness has been constant, with no change in intensity over those 3 days.  She does report that she has fatigue of her right eyelid, her right eye becomes teary, and her eyelid will droop.  She has not noticed asymmetry in her mouth.  Patient has a history of hypertension and is taking her lisinopril hydrochlorothiazide at home.  She has a history of hyperlipidemia and is not taking a statin.  Patient is a current smoker as well.  She has never had a stroke before.  Of note symptoms symptom onset, patient did use crack cocaine.  Patient is otherwise at her normal state of health aside from urinary frequency and urgency.  She reports that she is even had incontinence yesterday.  She has no dysuria.  Patient has no other complaints at this  time.  Patient is a current smoker half a pack per day and request nicotine patch.  She drinks alcohol 40 ounces of beer a day.  She used to drink liquor, and 1 time she went through DTs.  She has not been drinking liquor recently.  She is cocaine and her last use was 3 days ago.  She is not vaccinated for COVID.  Patient is full code.  In the ED Afebrile with stable vitals COVID-negative No leukocytosis with a white blood cell count of 6.0, hemoglobin 13.5 Chemistry panel is unremarkable UA is indicative of UTI, urine culture pending UDS is positive for cocaine Alcohol level is less than 10 EKG shows a heart rate of 78, sinus rhythm, QTC 454 MRI brain shows acute left thalamic infarct, mild chronic microvascular ischemic disease.  MRA shows short segment severe stenosis versus occlusion of the left P2 PCA with flow-related signal in the more distal PCA.  Moderate stenosis of the right P2 PCA Case was discussed with Dr. Leonel Ramsay who advised aspirin and Plavix and admission for  stroke work-up    Review of systems:    In addition to the HPI above,  No Fever-chills, No Headache, No changes with Vision or hearing, No problems swallowing food or Liquids, No Chest pain, Cough or Shortness of Breath, No Abdominal pain, No Nausea or Vomiting, bowel movements are regular, No Blood in stool or Urine, No dysuria, admits to urinary frequency and urgency No new skin rashes or bruises, No new joints pains-aches,  Admits to weakness and tingling in the right upper and lower extremity No recent weight gain or loss, No polyuria, polydypsia or polyphagia, No significant Mental Stressors.  All other systems reviewed and are negative.    Past History of the following :    Past Medical History:  Diagnosis Date   Atrophic vaginitis 07/24/2014   Bipolar disorder ([place])    BV (bacterial vaginosis) 07/24/2014   Hepatitis C    Harvoni   Hepatitis C    Herpes    Hyperlipidemia    Hypertension     LGSIL of cervix of undetermined significance 06/24/2020   2/22 will get Colpo   Lupus ([place])    ???   Medical history non-contributory    Vaginal discharge 07/24/2014      Past Surgical History:  Procedure Laterality Date   c section  12/18/1993   CESAREAN SECTION     COLONOSCOPY N/A 12/24/2013   FIE:PPIRJJ mucosa in the terminal ileum/TWO colon polyps removed/ The LEFT colon IS redundant/Small internal hemorrhoids. tubular adenoma. next TCS 12/2018   ESOPHAGOGASTRODUODENOSCOPY N/A 02/06/2014   SLF: 1. No obvious reason for weight loss identified 2. Dyspepsia due to MILD erosive gastritis.   FRACTURE SURGERY Right    20 years ago      Social History:      Social History   Tobacco Use   Smoking status: Every Day    Packs/day: 0.25    Years: 40.00    Pack years: 10.00    Types: Cigarettes   Smokeless tobacco: Never  Substance Use Topics   Alcohol use: Yes    Alcohol/week: 0.0 standard drinks    Comment: beer-daily       Family History :     Family History  Problem Relation Age of Onset   Colon cancer Maternal Aunt        age 57   Heart attack Mother    Thyroid disease Mother    Heart attack Father    Alcohol abuse Father    Depression Father    Heart disease Sister    Thyroid disease Sister    Bipolar disorder Sister    Cancer Sister        lung   Drug abuse Sister    Bipolar disorder Brother    Drug abuse Brother    Drug abuse Son    Liver disease Neg Hx       Home Medications:   Prior to Admission medications   Medication Sig Start Date End Date Taking? Authorizing Provider  albuterol (VENTOLIN HFA) 108 (90 Base) MCG/ACT inhaler Inhale 1-2 puffs into the lungs every 4 (four) hours as needed for wheezing or shortness of breath. 07/29/20  Yes [provider]  BREO ELLIPTA 100-25 MCG/INH AEPB Inhale 1 puff into the lungs daily. 07/21/20  Yes [provider]  hydrOXYzine (VISTARIL) 25 MG capsule Take 1 capsule (25 mg total) by mouth  every 8 (eight) hours as needed. Patient taking differently: Take 25 mg by mouth every 8 (eight) hours  as needed for anxiety. 06/30/20  Yes Derrek Monaco A, NP  lisinopril-hydrochlorothiazide (ZESTORETIC) 20-12.5 MG tablet Take 1 tablet by mouth daily. 10/10/20  Yes [provider]     Allergies:     Allergies  Allergen Reactions   Erythromycin     REACTION: GI upset     Physical Exam:   Vitals  Blood pressure 124/68, pulse 69, temperature 97.6 F (36.4 C), temperature source Oral, resp. rate 15, height 5\' 2"  (1.575 m), weight 43.1 kg, SpO2 97 %.  1.  General: Patient lying supine in bed,  no acute distress   2. Psychiatric: Alert and oriented x 3, mood and behavior normal for situation, pleasant and cooperative with exam   3. Neurologic: Speech and language are normal, face is symmetric, moves all 4 extremities voluntarily, 4 out of 5 strength in the right lower extremity, 3 out of 5 strength in the right upper extremity, hyposensitivity in the right lower extremity, right upper extremity, and right face normal strength in the left extremities, wide gait, normal balance, confidently ambulates around the ER without assistive device   4. HEENMT:  Head is atraumatic, normocephalic, pupils reactive to light, neck is supple, trachea is midline, mucous membranes are moist   5. Respiratory : Lungs are clear to auscultation bilaterally without wheezing, rhonchi, rales, no cyanosis, no increase in work of breathing or accessory muscle use   6. Cardiovascular : Heart rate normal, rhythm is regular, no murmurs, rubs or gallops, no peripheral edema, peripheral pulses palpated   7. Gastrointestinal:  Abdomen is soft, nondistended, nontender to palpation bowel sounds active, no masses or organomegaly palpated   8. Skin:  Skin is warm, dry and intact without rashes, acute lesions, or ulcers on limited exam   9.Musculoskeletal:  No acute deformities or trauma, no asymmetry in  tone, no peripheral edema, peripheral pulses palpated, no tenderness to palpation in the extremities     Data Review:    CBC Recent Labs  Lab 11/25/20 1205 11/25/20 1417  WBC 6.0  --   HGB 13.5 13.9  HCT 40.3 41.0  PLT 218  --   MCV 93.5  --   MCH 31.3  --   MCHC 33.5  --   RDW 12.8  --   LYMPHSABS 1.7  --   MONOABS 0.6  --   EOSABS 0.2  --   BASOSABS 0.1  --    ------------------------------------------------------------------------------------------------------------------  Results for orders placed or performed during the hospital encounter of 11/25/20 (from the past 48 hour(s))  Protime-INR     Status: None   Collection Time: 11/25/20 12:05 PM  Result Value Ref Range   Prothrombin Time 12.1 11.4 - 15.2 seconds   INR 0.9 0.8 - 1.2    Comment: (NOTE) INR goal varies based on device and disease states. Performed at [facility], [address], [place], [place] [address]   APTT     Status: None   Collection Time: 11/25/20 12:05 PM  Result Value Ref Range   aPTT 28 24 - 36 seconds    Comment: Performed at [facility], [address], [place], [place] [address]  CBC     Status: None   Collection Time: 11/25/20 12:05 PM  Result Value Ref Range   WBC 6.0 4.0 - 10.5 K/uL   RBC 4.31 3.87 - 5.11 MIL/uL   Hemoglobin 13.5 12.0 - 15.0 g/dL   HCT 40.3 36.0 - 46.0 %   MCV 93.5 80.0 - 100.0 fL  MCH 31.3 26.0 - 34.0 pg   MCHC 33.5 30.0 - 36.0 g/dL   RDW 12.8 11.5 - 15.5 %   Platelets 218 150 - 400 K/uL   nRBC 0.0 0.0 - 0.2 %    Comment: Performed at [facility], [address] [place], [place] [address]  Differential     Status: None   Collection Time: 11/25/20 12:05 PM  Result Value Ref Range   Neutrophils Relative % 57 %   Neutro Abs 3.4 1.7 - 7.7 K/uL   Lymphocytes Relative 29 %   Lymphs Abs 1.7 0.7 - 4.0 K/uL   Monocytes Relative 10 %   Monocytes Absolute 0.6 0.1 - 1.0 K/uL   Eosinophils Relative 3 %   Eosinophils Absolute 0.2 0.0 - 0.5 K/uL   Basophils  Relative 1 %   Basophils Absolute 0.1 0.0 - 0.1 K/uL   Immature Granulocytes 0 %   Abs Immature Granulocytes 0.01 0.00 - 0.07 K/uL    Comment: Performed at [facility], [address] [place], [place] [address]  Comprehensive metabolic panel     Status: Abnormal   Collection Time: 11/25/20 12:05 PM  Result Value Ref Range   Sodium 135 135 - 145 mmol/L   Potassium 4.0 3.5 - 5.1 mmol/L   Chloride 102 98 - 111 mmol/L   CO2 26 22 - 32 mmol/L   Glucose, Bld 95 70 - 99 mg/dL    Comment: Glucose reference range applies only to samples taken after fasting for at least 8 hours.   BUN 21 (H) 6 - 20 mg/dL   Creatinine, Ser 0.78 0.44 - 1.00 mg/dL   Calcium 9.4 8.9 - 10.3 mg/dL   Total Protein 7.8 6.5 - 8.1 g/dL   Albumin 4.2 3.5 - 5.0 g/dL   AST 35 15 - 41 U/L   ALT 29 0 - 44 U/L   Alkaline Phosphatase 76 38 - 126 U/L   Total Bilirubin 0.8 0.3 - 1.2 mg/dL   GFR, Estimated >60 >60 mL/min    Comment: (NOTE) Calculated using the CKD-EPI Creatinine Equation (2021)    Anion gap 7 5 - 15    Comment: Performed at [facility], [address] [place],  [address]  Resp Panel by RT-PCR (Flu A&B, Covid) Nasopharyngeal Swab     Status: None   Collection Time: 11/25/20  1:41 PM   Specimen: Nasopharyngeal Swab; Nasopharyngeal(NP) swabs in vial transport medium  Result Value Ref Range   SARS Coronavirus 2 by RT PCR NEGATIVE NEGATIVE    Comment: (NOTE) SARS-CoV-2 target nucleic acids are NOT DETECTED.  The SARS-CoV-2 RNA is generally detectable in upper respiratory specimens during the acute phase of infection. The lowest concentration of SARS-CoV-2 viral copies this assay can detect is 138 copies/mL. A negative result does not preclude SARS-Cov-2 infection and should not be used as the sole basis for treatment or other patient management decisions. A negative result may occur with  improper specimen collection/handling, submission of specimen other than nasopharyngeal swab, presence of viral  mutation(s) within the areas targeted by this assay, and inadequate number of viral copies(<138 copies/mL). A negative result must be combined with clinical observations, patient history, and epidemiological information. The expected result is Negative.  Fact Sheet for Patients:  EntrepreneurPulse.com.au  Fact Sheet for Healthcare Providers:  IncredibleEmployment.be  This test is no t yet approved or cleared by the [place] FDA and  has been authorized for detection and/or diagnosis of SARS-CoV-2 by FDA under an  Emergency Use Authorization (EUA). This EUA will remain  in effect (meaning this test can be used) for the duration of the COVID-19 declaration under Section 564(b)(1) of the Act, 21 U.S.C.section 360bbb-3(b)(1), unless the authorization is terminated  or revoked sooner.       Influenza A by PCR NEGATIVE NEGATIVE   Influenza B by PCR NEGATIVE NEGATIVE    Comment: (NOTE) The Xpert Xpress SARS-CoV-2/FLU/RSV plus assay is intended as an aid in the diagnosis of influenza from Nasopharyngeal swab specimens and should not be used as a sole basis for treatment. Nasal washings and aspirates are unacceptable for Xpert Xpress SARS-CoV-2/FLU/RSV testing.  Fact Sheet for Patients: EntrepreneurPulse.com.au  Fact Sheet for Healthcare Providers: IncredibleEmployment.be  This test is not yet approved or cleared by the [place] FDA and has been authorized for detection and/or diagnosis of SARS-CoV-2 by FDA under an Emergency Use Authorization (EUA). This EUA will remain in effect (meaning this test can be used) for the duration of the COVID-19 declaration under Section 564(b)(1) of the Act, 21 U.S.C. section 360bbb-3(b)(1), unless the authorization is terminated or revoked.  Performed at [facility], [address] [place], [place] [address]   Urine rapid drug screen (hosp performed)     Status:  Abnormal   Collection Time: 11/25/20  1:41 PM  Result Value Ref Range   Opiates NONE DETECTED NONE DETECTED   Cocaine POSITIVE (A) NONE DETECTED   Benzodiazepines NONE DETECTED NONE DETECTED   Amphetamines NONE DETECTED NONE DETECTED   Tetrahydrocannabinol NONE DETECTED NONE DETECTED   Barbiturates NONE DETECTED NONE DETECTED    Comment: (NOTE) DRUG SCREEN FOR MEDICAL PURPOSES ONLY.  IF CONFIRMATION IS NEEDED FOR ANY PURPOSE, NOTIFY LAB WITHIN 5 DAYS.  LOWEST DETECTABLE LIMITS FOR URINE DRUG SCREEN Drug Class                     Cutoff (ng/mL) Amphetamine and metabolites    1000 Barbiturate and metabolites    200 Benzodiazepine                 604 Tricyclics and metabolites     300 Opiates and metabolites        300 Cocaine and metabolites        300 THC                            50 Performed at [facility], [address], [place], [place] [address]   Urinalysis, Routine w reflex microscopic Urine, Clean Catch     Status: Abnormal   Collection Time: 11/25/20  1:41 PM  Result Value Ref Range   Color, Urine YELLOW YELLOW   APPearance HAZY (A) CLEAR   Specific Gravity, Urine 1.012 1.005 - 1.030   pH 6.0 5.0 - 8.0   Glucose, UA NEGATIVE NEGATIVE mg/dL   Hgb urine dipstick SMALL (A) NEGATIVE   Bilirubin Urine NEGATIVE NEGATIVE   Ketones, ur NEGATIVE NEGATIVE mg/dL   Protein, ur NEGATIVE NEGATIVE mg/dL   Nitrite NEGATIVE NEGATIVE   Leukocytes,Ua LARGE (A) NEGATIVE   RBC / HPF 0-5 0 - 5 RBC/hpf   WBC, UA >50 (H) 0 - 5 WBC/hpf   Bacteria, UA MANY (A) NONE SEEN   Squamous Epithelial / LPF 0-5 0 - 5    Comment: Performed at [facility], [address] [place],  [address]  Ethanol     Status: None   Collection Time: 11/25/20  1:56 PM  Result Value Ref Range   Alcohol, Ethyl (B) <10 <10 mg/dL    Comment: (NOTE) Lowest detectable limit for serum alcohol is 10 mg/dL.  For medical purposes only. Performed at [facility], [address], [place], [place] [address]    Ginger Carne 8, ED     Status: Abnormal   Collection Time: 11/25/20  2:17 PM  Result Value Ref Range   Sodium 136 135 - 145 mmol/L   Potassium 4.0 3.5 - 5.1 mmol/L   Chloride 104 98 - 111 mmol/L   BUN 20 6 - 20 mg/dL   Creatinine, Ser 0.70 0.44 - 1.00 mg/dL   Glucose, Bld 94 70 - 99 mg/dL    Comment: Glucose reference range applies only to samples taken after fasting for at least 8 hours.   Calcium, Ion 1.08 (L) 1.15 - 1.40 mmol/L   TCO2 26 22 - 32 mmol/L   Hemoglobin 13.9 12.0 - 15.0 g/dL   HCT 41.0 36.0 - 46.0 %    Chemistries  Recent Labs  Lab 11/25/20 1205 11/25/20 1417  NA 135 136  K 4.0 4.0  CL 102 104  CO2 26  --   GLUCOSE 95 94  BUN 21* 20  CREATININE 0.78 0.70  CALCIUM 9.4  --   AST 35  --   ALT 29  --   ALKPHOS 76  --   BILITOT 0.8  --    ------------------------------------------------------------------------------------------------------------------  ------------------------------------------------------------------------------------------------------------------ GFR: Estimated Creatinine Clearance: 51.5 mL/min (by C-G formula based on SCr of 0.7 mg/dL). Liver Function Tests: Recent Labs  Lab 11/25/20 1205  AST 35  ALT 29  ALKPHOS 76  BILITOT 0.8  PROT 7.8  ALBUMIN 4.2   No results for input(s): LIPASE, AMYLASE in the last 168 hours. No results for input(s): AMMONIA in the last 168 hours. Coagulation Profile: Recent Labs  Lab 11/25/20 1205  INR 0.9   Cardiac Enzymes: No results for input(s): CKTOTAL, CKMB, CKMBINDEX, TROPONINI in the last 168 hours. BNP (last 3 results) No results for input(s): PROBNP in the last 8760 hours. HbA1C: No results for input(s): HGBA1C in the last 72 hours. CBG: No results for input(s): GLUCAP in the last 168 hours. Lipid Profile: No results for input(s): CHOL, HDL, LDLCALC, TRIG, CHOLHDL, LDLDIRECT in the last 72 hours. Thyroid Function Tests: No results for input(s): TSH, T4TOTAL, FREET4, T3FREE, THYROIDAB  in the last 72 hours. Anemia Panel: No results for input(s): VITAMINB12, FOLATE, FERRITIN, TIBC, IRON, RETICCTPCT in the last 72 hours.  --------------------------------------------------------------------------------------------------------------- Urine analysis:    Component Value Date/Time   COLORURINE YELLOW 11/25/2020 1341   APPEARANCEUR HAZY (A) 11/25/2020 1341   LABSPEC 1.012 11/25/2020 1341   PHURINE 6.0 11/25/2020 1341   GLUCOSEU NEGATIVE 11/25/2020 1341   HGBUR SMALL (A) 11/25/2020 1341   BILIRUBINUR NEGATIVE 11/25/2020 1341   KETONESUR NEGATIVE 11/25/2020 1341   PROTEINUR NEGATIVE 11/25/2020 1341   NITRITE NEGATIVE 11/25/2020 1341   LEUKOCYTESUR LARGE (A) 11/25/2020 1341      Imaging Results:    MR ANGIO HEAD WO CONTRAST  Result Date: 11/25/2020 EXAM: MRI HEAD WITHOUT CONTRAST MRA HEAD WITHOUT CONTRAST TECHNIQUE: Multiplanar, multi-echo pulse sequences of the brain and surrounding structures were acquired without intravenous contrast. Angiographic images of the Circle of Willis were acquired using MRA technique without intravenous contrast. COMPARISON:  No pertinent prior exam. FINDINGS: MRI HEAD FINDINGS The examination had to be discontinued prior to completion due to claustrophobia. Within this limitation: Brain: Acute left thalamic  infarct. Associated edema without significant mass effect. No acute hemorrhage, hydrocephalus, mass lesion, or extra-axial fluid collection. No midline shift. Mild scattered T2/FLAIR hyperintensities within the white matter, nonspecific but most likely related to chronic microvascular ischemic disease. Dilated perivascular spaces in bilateral inferior basal ganglia. Vascular: See below. Skull and upper cervical spine: Normal marrow signal. Sinuses/Orbits: Moderate left maxillary sinus mucosal thickening Other: No mastoid effusions. MRA HEAD FINDINGS Anterior circulation: Bilateral intracranial ICAs, MCAs, and ACAs are patent without evidence of a  proximal hemodynamically significant stenosis. Mild right greater than left cavernous and paraclinoid ICA narrowing. Evaluation of the distal ACA and MCA branches is limited due to artifact. Possible moderate stenosis of a right M2 MCA branch. Posterior circulation: Right dominant intradural vertebral artery. Visualized intradural vertebral arteries are patent without significant stenosis. The visualized basilar artery is patent without significant stenosis. Short segment severe stenosis versus occlusion of the left P2 PCA with flow related signal in the more distal PCA. Moderate stenosis of the right P2 PCA. IMPRESSION: MRI: 1. Acute left thalamic infarct. Associated edema without significant mass effect. 2. Mild chronic microvascular ischemic disease. 3. Moderate left maxillary sinus mucosal thickening. MRA: 1. Short segment severe stenosis versus occlusion of the left P2 PCA with flow related signal in the more distal PCA. 2. Moderate stenosis of the right P2 PCA. 3. Possible moderate stenosis of a right M2 MCA branch. Electronically Signed   By: Margaretha Sheffield MD   On: 11/25/2020 17:28   MR BRAIN WO CONTRAST  Result Date: 11/25/2020 EXAM: MRI HEAD WITHOUT CONTRAST MRA HEAD WITHOUT CONTRAST TECHNIQUE: Multiplanar, multi-echo pulse sequences of the brain and surrounding structures were acquired without intravenous contrast. Angiographic images of the Circle of Willis were acquired using MRA technique without intravenous contrast. COMPARISON:  No pertinent prior exam. FINDINGS: MRI HEAD FINDINGS The examination had to be discontinued prior to completion due to claustrophobia. Within this limitation: Brain: Acute left thalamic infarct. Associated edema without significant mass effect. No acute hemorrhage, hydrocephalus, mass lesion, or extra-axial fluid collection. No midline shift. Mild scattered T2/FLAIR hyperintensities within the white matter, nonspecific but most likely related to chronic microvascular  ischemic disease. Dilated perivascular spaces in bilateral inferior basal ganglia. Vascular: See below. Skull and upper cervical spine: Normal marrow signal. Sinuses/Orbits: Moderate left maxillary sinus mucosal thickening Other: No mastoid effusions. MRA HEAD FINDINGS Anterior circulation: Bilateral intracranial ICAs, MCAs, and ACAs are patent without evidence of a proximal hemodynamically significant stenosis. Mild right greater than left cavernous and paraclinoid ICA narrowing. Evaluation of the distal ACA and MCA branches is limited due to artifact. Possible moderate stenosis of a right M2 MCA branch. Posterior circulation: Right dominant intradural vertebral artery. Visualized intradural vertebral arteries are patent without significant stenosis. The visualized basilar artery is patent without significant stenosis. Short segment severe stenosis versus occlusion of the left P2 PCA with flow related signal in the more distal PCA. Moderate stenosis of the right P2 PCA. IMPRESSION: MRI: 1. Acute left thalamic infarct. Associated edema without significant mass effect. 2. Mild chronic microvascular ischemic disease. 3. Moderate left maxillary sinus mucosal thickening. MRA: 1. Short segment severe stenosis versus occlusion of the left P2 PCA with flow related signal in the more distal PCA. 2. Moderate stenosis of the right P2 PCA. 3. Possible moderate stenosis of a right M2 MCA branch. Electronically Signed   By: Margaretha Sheffield MD   On: 11/25/2020 17:28     Assessment & Plan:    Active Problems:  Hypercholesterolemia   Essential hypertension   Current smoker   Stroke [facility])   Substance abuse ([place])   Stroke-with stenosis of P2 PCA Right-sided weakness in right-hand-dominant patient Likely induced by cocaine use Inpatient consult to neurology Start aspirin daily, loaded with Plavix, continue Plavix daily PT/OT/ST Echo and ultrasound carotids MRI and MRA brain already completed showing acute  stroke Hypertension, hyperlipidemia Continue lisinopril, hydrochlorothiazide, statin Patient is out of the permissive hypertension window given symptom onset was 3 days ago COPD Continue albuterol and Breo Current smoker Patient is in contemplative stage regarding smoking cessation Counseled on the importance of smoking cessation Nicotine patch ordered Polysubstance abuse CIWA protocol in case of alcohol withdrawals Consult transition of care team UDS positive for cocaine Counseled on the importance of cessation     DVT Prophylaxis-   Heparin- SCDs   AM Labs Ordered, also please review Full Orders  Family Communication: No family at bedside Code Status: Full  Admission status: Inpatient :The appropriate admission status for this patient is INPATIENT. Inpatient status is judged to be reasonable and necessary in order to provide the required intensity of service to ensure the patient's safety. The patient's presenting symptoms, physical exam findings, and initial radiographic and laboratory data in the context of their chronic comorbidities is felt to place them at high risk for further clinical deterioration. Furthermore, it is not anticipated that the patient will be medically stable for discharge from the hospital within 2 midnights of admission. The following factors support the admission status of inpatient.     The patient's presenting symptoms include right-sided weakness. The worrisome physical exam findings include objective right-sided weakness. The initial radiographic and laboratory data are worrisome because of acute stroke. The chronic co-morbidities include hypertension, hyperlipidemia, substance abuse.       * I certify that at the point of admission it is my clinical judgment that the patient will require inpatient hospital care spanning beyond 2 midnights from the point of admission due to high intensity of service, high risk for further deterioration and high  frequency of surveillance required.*  Time spent in minutes : [place]

## 2020-11-25 NOTE — ED Notes (Signed)
Pt to MRI at this time.

## 2020-11-26 ENCOUNTER — Encounter (HOSPITAL_COMMUNITY): Payer: Self-pay | Admitting: Internal Medicine

## 2020-11-26 ENCOUNTER — Inpatient Hospital Stay (HOSPITAL_BASED_OUTPATIENT_CLINIC_OR_DEPARTMENT_OTHER): Payer: Medicare Other

## 2020-11-26 ENCOUNTER — Inpatient Hospital Stay (HOSPITAL_COMMUNITY): Payer: Medicare Other

## 2020-11-26 DIAGNOSIS — E7800 Pure hypercholesterolemia, unspecified: Secondary | ICD-10-CM | POA: Diagnosis not present

## 2020-11-26 DIAGNOSIS — N3000 Acute cystitis without hematuria: Secondary | ICD-10-CM

## 2020-11-26 DIAGNOSIS — F1410 Cocaine abuse, uncomplicated: Secondary | ICD-10-CM | POA: Diagnosis not present

## 2020-11-26 DIAGNOSIS — I6389 Other cerebral infarction: Secondary | ICD-10-CM

## 2020-11-26 DIAGNOSIS — I10 Essential (primary) hypertension: Secondary | ICD-10-CM | POA: Diagnosis not present

## 2020-11-26 LAB — COMPREHENSIVE METABOLIC PANEL
ALT: 23 U/L (ref 0–44)
AST: 26 U/L (ref 15–41)
Albumin: 3.6 g/dL (ref 3.5–5.0)
Alkaline Phosphatase: 70 U/L (ref 38–126)
Anion gap: 8 (ref 5–15)
BUN: 23 mg/dL — ABNORMAL HIGH (ref 6–20)
CO2: 26 mmol/L (ref 22–32)
Calcium: 9.1 mg/dL (ref 8.9–10.3)
Chloride: 103 mmol/L (ref 98–111)
Creatinine, Ser: 0.75 mg/dL (ref 0.44–1.00)
GFR, Estimated: >60 mL/min (ref >60)
Glucose, Bld: 97 mg/dL (ref 70–99)
Potassium: 3.6 mmol/L (ref 3.5–5.1)
Sodium: 137 mmol/L (ref 135–145)
Total Bilirubin: 0.5 mg/dL (ref 0.3–1.2)
Total Protein: 6.9 g/dL (ref 6.5–8.1)

## 2020-11-26 LAB — CBC
HCT: 40.2 % (ref 36.0–46.0)
Hemoglobin: 13.1 g/dL (ref 12.0–15.0)
MCH: 30.2 pg (ref 26.0–34.0)
MCHC: 32.6 g/dL (ref 30.0–36.0)
MCV: 92.6 fL (ref 80.0–100.0)
Platelets: 218 10*3/uL (ref 150–400)
RBC: 4.34 MIL/uL (ref 3.87–5.11)
RDW: 12.8 % (ref 11.5–15.5)
WBC: 5.2 10*3/uL (ref 4.0–10.5)
nRBC: 0 % (ref 0.0–0.2)

## 2020-11-26 LAB — ECHOCARDIOGRAM COMPLETE
AR max vel: 2.46 cm2
AV Area VTI: 2.22 cm2
AV Area mean vel: 2.28 cm2
AV Mean grad: 2 mmHg
AV Peak grad: 4.1 mmHg
Ao pk vel: 1.01 m/s
Area-P 1/2: 3.17 cm2
Height: 62 in
MV VTI: 2.06 cm2
S' Lateral: 2.45 cm
Weight: 1520 oz

## 2020-11-26 LAB — LIPID PANEL
Cholesterol: 274 mg/dL — ABNORMAL HIGH (ref 0–200)
HDL: 79 mg/dL (ref >40)
LDL Cholesterol: 174 mg/dL — ABNORMAL HIGH (ref 0–99)
Total CHOL/HDL Ratio: 3.5 RATIO
Triglycerides: 107 mg/dL (ref <150)
VLDL: 21 mg/dL (ref 0–40)

## 2020-11-26 LAB — HEMOGLOBIN A1C
Hgb A1c MFr Bld: 5.4 % (ref 4.8–5.6)
Mean Plasma Glucose: 108 mg/dL

## 2020-11-26 LAB — HIV ANTIBODY (ROUTINE TESTING W REFLEX): HIV Screen 4th Generation wRfx: NONREACTIVE

## 2020-11-26 LAB — TSH: TSH: 1.42 u[IU]/mL (ref 0.350–4.500)

## 2020-11-26 LAB — VITAMIN B12: Vitamin B-12: 568 pg/mL (ref 180–914)

## 2020-11-26 MED ORDER — LISINOPRIL-HYDROCHLOROTHIAZIDE 20-12.5 MG PO TABS: 1.0000 | ORAL_TABLET | Freq: Every day | ORAL | 1 refills | Status: DC

## 2020-11-26 MED ORDER — CEFDINIR 300 MG PO CAPS: 300.0000 mg | ORAL_CAPSULE | Freq: Two times a day (BID) | ORAL | 0 refills | Status: DC

## 2020-11-26 MED ORDER — ASPIRIN EC 81 MG PO TBEC: 81.0000 mg | DELAYED_RELEASE_TABLET | Freq: Every day | ORAL | 11 refills | Status: AC

## 2020-11-26 MED ORDER — ATORVASTATIN CALCIUM 40 MG PO TABS
40.0000 mg | ORAL_TABLET | Freq: Every day | ORAL | Status: DC
  Administered 2020-11-26: 40 mg via ORAL
  Filled 2020-11-26: qty 1

## 2020-11-26 MED ORDER — CLOPIDOGREL BISULFATE 75 MG PO TABS
75.0000 mg | ORAL_TABLET | Freq: Every day | ORAL | 0 refills | Status: DC
Start: 2020-11-27 — End: 2022-10-25

## 2020-11-26 MED ORDER — NICOTINE 14 MG/24HR TD PT24: 14.0000 mg | MEDICATED_PATCH | Freq: Every day | TRANSDERMAL | 0 refills | Status: DC

## 2020-11-26 MED ORDER — ATORVASTATIN CALCIUM 40 MG PO TABS: 40.0000 mg | ORAL_TABLET | Freq: Every day | ORAL | 1 refills | Status: AC

## 2020-11-26 NOTE — Evaluation (Signed)
Physical Therapy Evaluation Patient Details Name: Brittany Lester MRN: [id number] DOB: Oct 19, 1960 Today's Date: 11/26/2020   History of Present Illness  Brittany Lester  is a 60 y.o. female, with history of bipolar disorder, substance abuse, hepatitis C, hyperlipidemia, hypertension, lupus, and more presents the ED with a chief complaint of right-sided weakness.  Of note symptoms symptom onset, patient did use crack cocaine.  Patient is otherwise at her normal state of health aside from urinary frequency and urgency.  MRI positive for CVA.   Clinical Impression  Patient demonstrates good return for getting into/out of bed, no loss of balance during ambulation on level surfaces and going up/down stairs using 1 side rail, slightly decreased strength and right ankle dorsiflexion with c/o numbness and required verbal cues to let RUE swing due to guarding.  Plan:  Patient discharged from physical therapy to care of nursing for ambulation daily as tolerated for length of stay.     Follow Up Recommendations Outpatient PT;Other (comment) (for balance and RLE strengthening)    Equipment Recommendations  None recommended by PT    Recommendations for Other Services       Precautions / Restrictions Precautions Precautions: None Restrictions Weight Bearing Restrictions: No      Mobility  Bed Mobility Overal bed mobility: Modified Independent             General bed mobility comments: increased time    Transfers Overall transfer level: Needs assistance Equipment used: None Transfers: Sit to/from Stand;Stand Pivot Transfers Sit to Stand: Modified independent (Device/Increase time);Supervision Stand pivot transfers: Modified independent (Device/Increase time);Supervision          Ambulation/Gait Ambulation/Gait assistance: Supervision Gait Distance (Feet): 120 Feet Assistive device: None Gait Pattern/deviations: Decreased step length - right;Decreased stride length;Decreased  dorsiflexion - right Gait velocity: decreased   General Gait Details: labored cadence with slightly decreased right ankle dorsiflexion, decreased right armswing due to guarding requiring verbal cues to relax with fair carryover, no loss of balance  Stairs Stairs: Yes Stairs assistance: Supervision;Modified independent (Device/Increase time) Stair Management: One rail Right;One rail Left;Step to pattern Number of Stairs: 5 General stair comments: demonstrates good return for going up/down stairs using 1 siderail with step to pattern without loss of balance  Wheelchair Mobility    Modified Rankin (Stroke Patients Only)       Balance Overall balance assessment: Mild deficits observed, not formally tested                                           Pertinent Vitals/Pain Pain Assessment: 0-10 Pain Score: 2  Pain Location: numbness RLE Pain Descriptors / Indicators: Numbness Pain Intervention(s): Limited activity within patient's tolerance;Monitored during session    Home Living Family/patient expects to be discharged to:: Private residence Living Arrangements: Spouse/significant other Available Help at Discharge: Family Type of Home: Apartment Home Access: Stairs to enter Entrance Stairs-Rails: Right;Left;Can reach both Technical brewer of Steps: 6 Home Layout: One level Home Equipment: None      Prior Function Level of Independence: Independent         Comments: independent in ADLs, mobility, driving     Hand Dominance   Dominant Hand: Right    Extremity/Trunk Assessment   Upper Extremity Assessment Upper Extremity Assessment: Defer to OT evaluation    Lower Extremity Assessment Lower Extremity Assessment: RLE deficits/detail RLE Deficits / Details: grossly 4/5, slightly limited  right ankle dorsiflexion during ambulation RLE Sensation: WNL RLE Coordination: WNL    Cervical / Trunk Assessment Cervical / Trunk Assessment: Normal   Communication   Communication: No difficulties  Cognition Arousal/Alertness: Awake/alert Behavior During Therapy: WFL for tasks assessed/performed Overall Cognitive Status: Within Functional Limits for tasks assessed                                        General Comments      Exercises     Assessment/Plan    PT Assessment All further PT needs can be met in the next venue of care  PT Problem List Decreased strength;Decreased activity tolerance;Decreased balance;Decreased mobility       PT Treatment Interventions      PT Goals (Current goals can be found in the Care Plan section)  Acute Rehab PT Goals Patient Stated Goal: return home with family to assist PT Goal Formulation: With patient Time For Goal Achievement: 11/26/20 Potential to Achieve Goals: Good    Frequency     Barriers to discharge        Co-evaluation               AM-PAC PT "6 Clicks" Mobility  Outcome Measure Help needed turning from your back to your side while in a flat bed without using bedrails?: None Help needed moving from lying on your back to sitting on the side of a flat bed without using bedrails?: None Help needed moving to and from a bed to a chair (including a wheelchair)?: None Help needed standing up from a chair using your arms (e.g., wheelchair or bedside chair)?: None Help needed to walk in hospital room?: A Little Help needed climbing 3-5 steps with a railing? : A Little 6 Click Score: 22    End of Session   Activity Tolerance: Patient tolerated treatment well;Patient limited by fatigue Patient left: in bed;with call bell/phone within reach Nurse Communication: Mobility status PT Visit Diagnosis: Unsteadiness on feet (R26.81);Other abnormalities of gait and mobility (R26.89);Muscle weakness (generalized) (M62.81)    Time: 1245-8099 PT Time Calculation (min) (ACUTE ONLY): 20 min   Charges:   PT Evaluation $PT Eval Moderate Complexity: 1 Mod PT  Treatments $Therapeutic Activity: 8-22 mins        1:53 PM, 11/26/20 Lonell Grandchild, MPT Physical Therapist with [facility] [phone] office [phone] mobile phone

## 2020-11-26 NOTE — TOC Initial Note (Signed)
Transition of Care [facility]) - Initial/Assessment Note    Patient Details  Name: Brittany Lester MRN: [id number] Date of Birth: January 01, 1961  Transition of [facility]) CM/SW Contact:    Iona Beard, [place] Phone Number: 11/26/2020, 8:11 AM  Clinical Narrative:                 [facility] consulted for substance use education. CSW spoke with pt to complete initial assessment. Pt states that she lives with her boyfriend. Prior to admission pt has been able to complete her ADLs independently. Pt is able to provide her own transportation. Pt states that she has had [employer] services on one occasion but cannot remember the company used. Pt states she does not have or use any DME. CSW inquired about pts interest in resources for substance use. Pt states that she is interested. CSW to provide resources to pt in room. TOC to follow.   Expected Discharge Plan: Home/Self Care Barriers to Discharge: Continued Medical Work up   Patient Goals and CMS Choice Patient states their goals for this hospitalization and ongoing recovery are:: Return home CMS Medicare.gov Compare Post Acute Care list provided to:: Patient Choice offered to / list presented to : Patient  Expected Discharge Plan and Services Expected Discharge Plan: Home/Self Care In-house Referral: Clinical Social Work Discharge Planning Services: CM Consult   Living arrangements for the past 2 months: Single Family Home                                      Prior Living Arrangements/Services Living arrangements for the past 2 months: Single Family Home Lives with:: Significant Other Patient language and need for interpreter reviewed:: Yes Do you feel safe going back to the place where you live?: Yes      Need for Family Participation in Patient Care: No (Comment) Care giver support system in place?: Yes (comment)   Criminal Activity/Legal Involvement Pertinent to Current Situation/Hospitalization: No - Comment as needed  Activities of Daily  Living Home Assistive Devices/Equipment: None ADL Screening (condition at time of admission) Patient's cognitive ability adequate to safely complete daily activities?: Yes Is the patient deaf or have difficulty hearing?: No Does the patient have difficulty seeing, even when wearing glasses/contacts?: No Does the patient have difficulty concentrating, remembering, or making decisions?: No Patient able to express need for assistance with ADLs?: Yes Does the patient have difficulty dressing or bathing?: No Independently performs ADLs?: Yes (appropriate for developmental age) Does the patient have difficulty walking or climbing stairs?: No Weakness of Legs: Right Weakness of Arms/Hands: Right  Permission Sought/Granted                  Emotional Assessment Appearance:: Appears stated age Attitude/Demeanor/Rapport: Engaged Affect (typically observed): Accepting Orientation: : Oriented to Self, Oriented to Place, Oriented to  Time, Oriented to Situation Alcohol / Substance Use: Not Applicable Psych Involvement: No (comment)  Admission diagnosis:  Cocaine abuse ([place]) [F14.10] Stroke ([place]) [I63.9] Right arm numbness [R20.0] Right leg weakness [R29.898] Acute cystitis without hematuria [N30.00] Patient Active Problem List   Diagnosis Date Noted   Stroke [facility]) 11/25/2020   Substance abuse ([place]) 11/25/2020   Abnormal Pap smear of cervix 06/24/2020   Encounter for screening fecal occult blood testing 06/14/2020   Encounter for gynecological examination with Papanicolaou smear of cervix 06/14/2020   Routine medical exam 06/14/2020   Smoker 06/14/2020   Postmenopause  06/14/2020   Constipation 08/03/2014   Current smoker 08/03/2014   HCV infection 08/03/2014   Vaginal discharge 07/24/2014   BV (bacterial vaginosis) 07/24/2014   Atrophic vaginitis 07/24/2014   Loss of weight 01/06/2014   Abdominal pain, unspecified site 01/06/2014   PRURITUS 09/28/2008   TRANSAMINASES, SERUM,  ELEVATED 04/07/2008   ECZEMA 03/04/2008   Chronic hepatitis C virus infection ([place]) 12/10/2007   Hypercholesterolemia 12/10/2007   BIPOLAR DISORDER UNSPECIFIED 12/10/2007   TOBACCO ABUSE 12/10/2007   Essential hypertension 12/10/2007   ALLERGIC RHINITIS 12/10/2007   GERD 12/10/2007   LUPUS 12/10/2007   ARTHRITIS 12/10/2007   LOW BACK PAIN 12/10/2007   Pain in limb 12/10/2007   PCP:  Rosita Fire, MD Pharmacy:   [id number] [id number]: [phone]: [phone] [id number] [id number]: [phone]: [phone]   Social Determinants of Health (SDOH) Interventions    Readmission Risk Interventions No flowsheet data found.

## 2020-11-26 NOTE — Progress Notes (Signed)
  Echocardiogram 2D Echocardiogram has been performed.  Brittany Lester 11/26/2020, 11:02 AM

## 2020-11-26 NOTE — Evaluation (Signed)
Occupational Therapy Evaluation Patient Details Name: Brittany Lester MRN: [id number] DOB: May 01, 1961 Today's Date: 11/26/2020    History of Present Illness Brittany Lester  is a 60 y.o. female, with history of bipolar disorder, substance abuse, hepatitis C, hyperlipidemia, hypertension, lupus, and more presents the ED with a chief complaint of right-sided weakness.  Of note symptoms symptom onset, patient did use crack cocaine.  Patient is otherwise at her normal state of health aside from urinary frequency and urgency.  MRI positive for CVA.   Clinical Impression   Pt agreeable to OT evaluation this am. Pt with RUE weakness throughout, decreased grip strength and coordination. Pt able to incorporate RUE into ADLs with slightly increased time. Pt performing functional mobility at supervision level, reaching for wall and countertop for balance occasionally. Recommend outpatient OT services on discharge to improve RUE strength and functioning as dominant during ADLs.     Follow Up Recommendations  Outpatient OT    Equipment Recommendations  Tub/shower seat       Precautions / Restrictions Precautions Precautions: Fall Restrictions Weight Bearing Restrictions: No      Mobility Bed Mobility Overal bed mobility: Modified Independent             General bed mobility comments: increased time    Transfers Overall transfer level: Needs assistance Equipment used: None Transfers: Sit to/from Stand;Stand Pivot Transfers Sit to Stand: Supervision Stand pivot transfers: Supervision                ADL either performed or assessed with clinical judgement   ADL Overall ADL's : Needs assistance/impaired     Grooming: Wash/dry hands;Min guard;Standing Grooming Details (indicate cue type and reason): Pt standing at sink for ADLs, incorporating RUE with slightly increased time             Lower Body Dressing: Modified independent;Sitting/lateral leans Lower Body Dressing  Details (indicate cue type and reason): pt donning and adjusting socks while seated at EOB Toilet Transfer: Supervision/safety;Ambulation;Regular Glass blower/designer Details (indicate cue type and reason): Pt performing functional mobility with no DME, supervision from bed to bathroom Toileting- Clothing Manipulation and Hygiene: Supervision/safety;Sitting/lateral lean;Sit to/from stand       Functional mobility during ADLs: Supervision/safety;Min guard       Vision Baseline Vision/History: Wears glasses Wears Glasses: Reading only Patient Visual Report: No change from baseline Vision Assessment?: No apparent visual deficits            Pertinent Vitals/Pain Pain Assessment: No/denies pain     Hand Dominance Right   Extremity/Trunk Assessment Upper Extremity Assessment Upper Extremity Assessment: RUE deficits/detail RUE Deficits / Details: RUE strength 3/5 throughout. Decreased grip strength RUE Sensation: decreased light touch RUE Coordination: decreased fine motor;decreased gross motor   Lower Extremity Assessment Lower Extremity Assessment: Defer to PT evaluation   Cervical / Trunk Assessment Cervical / Trunk Assessment: Normal   Communication Communication Communication: No difficulties   Cognition Arousal/Alertness: Awake/alert Behavior During Therapy: WFL for tasks assessed/performed Overall Cognitive Status: Within Functional Limits for tasks assessed                                                Home Living Family/patient expects to be discharged to:: Private residence Living Arrangements: Spouse/significant other (boyfriend) Available Help at Discharge: Family Type of Home: Apartment Home Access: Stairs to enter [employer] of  Steps: 6 Entrance Stairs-Rails: Right;Left;Can reach both Home Layout: One level     Bathroom Shower/Tub: Occupational psychologist: Standard     Home Equipment: None           Prior Functioning/Environment Level of Independence: Independent        Comments: independent in ADLs, mobility, driving        OT Problem List: Decreased strength;Decreased activity tolerance;Impaired balance (sitting and/or standing);Decreased coordination;Decreased safety awareness;Decreased knowledge of use of DME or AE;Decreased knowledge of precautions;Impaired UE functional use      OT Treatment/Interventions: Self-care/ADL training;Therapeutic exercise;Neuromuscular education;DME and/or AE instruction;Therapeutic activities;Patient/family education    OT Goals(Current goals can be found in the care plan section) Acute Rehab OT Goals Patient Stated Goal: to get stronger in her right arm OT Goal Formulation: With patient Time For Goal Achievement: 12/10/20 Potential to Achieve Goals: Good  OT Frequency: Min 2X/week                 End of Session Equipment Utilized During Treatment: Gait belt Nurse Communication: Mobility status  Activity Tolerance: Patient tolerated treatment well Patient left: in chair;with call bell/phone within reach;with nursing/sitter in room  OT Visit Diagnosis: Repeated falls (R29.6);Hemiplegia and hemiparesis Hemiplegia - Right/Left: Right Hemiplegia - dominant/non-dominant: Dominant Hemiplegia - caused by: Cerebral infarction                [id number] OT Time Calculation (min): 17 min Charges:  OT General Charges $OT Visit: 1 Visit OT Evaluation $OT Eval Low Complexity: [place], OTR/L  [phone] 11/26/2020, 8:19 AM

## 2020-11-26 NOTE — Plan of Care (Signed)
  Problem: Education: Goal: Knowledge of disease or condition will improve 11/26/2020 1928 by Santa Lighter, RN Outcome: Adequate for Discharge 11/26/2020 1928 by Santa Lighter, RN Outcome: Progressing Goal: Knowledge of secondary prevention will improve 11/26/2020 1928 by Santa Lighter, RN Outcome: Adequate for Discharge 11/26/2020 1928 by Santa Lighter, RN Outcome: Progressing Goal: Knowledge of patient specific risk factors addressed and post discharge goals established will improve 11/26/2020 1928 by Santa Lighter, RN Outcome: Adequate for Discharge 11/26/2020 1928 by Santa Lighter, RN Outcome: Progressing   Problem: Coping: Goal: Will identify appropriate support needs 11/26/2020 1928 by Santa Lighter, RN Outcome: Adequate for Discharge 11/26/2020 1928 by Santa Lighter, RN Outcome: Progressing   Problem: Self-Care: Goal: Ability to participate in self-care as condition permits will improve 11/26/2020 1928 by Santa Lighter, RN Outcome: Adequate for Discharge 11/26/2020 1928 by Santa Lighter, RN Outcome: Progressing   Problem: Education: Goal: Knowledge of General Education information will improve Description: Including pain rating scale, medication(s)/side effects and non-pharmacologic comfort measures 11/26/2020 1928 by Santa Lighter, RN Outcome: Adequate for Discharge 11/26/2020 1928 by Santa Lighter, RN Outcome: Progressing   Problem: Health Behavior/Discharge Planning: Goal: Ability to manage health-related needs will improve 11/26/2020 1928 by Santa Lighter, RN Outcome: Adequate for Discharge 11/26/2020 1928 by Santa Lighter, RN Outcome: Progressing   Problem: Clinical Measurements: Goal: Diagnostic test results will improve Outcome: Adequate for Discharge   Problem: Activity: Goal: Risk for activity intolerance will decrease Outcome: Adequate for Discharge   Problem: Nutrition: Goal: Adequate nutrition will be maintained Outcome: Adequate for  Discharge   Problem: Safety: Goal: Ability to remain free from injury will improve Outcome: Adequate for Discharge

## 2020-11-26 NOTE — Plan of Care (Signed)
  Problem: Acute Rehab OT Goals (only OT should resolve) Goal: Pt. Will Perform Eating Flowsheets (Taken 11/26/2020 0821) Pt Will Perform Eating:  with modified independence  sitting Goal: Pt. Will Perform Grooming Flowsheets (Taken 11/26/2020 [phone]) Pt Will Perform Grooming:  with modified independence  standing Goal: Pt. Will Transfer To Toilet Flowsheets (Taken 11/26/2020 [phone]) Pt Will Transfer to Toilet:  with modified independence  ambulating  regular height toilet Goal: Pt. Will Perform Toileting-Clothing Manipulation Flowsheets (Taken 11/26/2020 0821) Pt Will Perform Toileting - Clothing Manipulation and hygiene:  with modified independence  sit to/from stand  sitting/lateral leans Goal: Pt/Caregiver Will Perform Home Exercise Program Flowsheets (Taken 11/26/2020 [phone]) Pt/caregiver will Perform Home Exercise Program:  Increased strength  Right Upper extremity  Independently  With written HEP provided

## 2020-11-26 NOTE — Evaluation (Signed)
Speech Language Pathology Evaluation Patient Details Name: Brittany Lester MRN: [id number] DOB: 04/08/1961 Today's Date: 11/26/2020 Time: [id number] SLP Time Calculation (min) (ACUTE ONLY): 26 min  Problem List:  Patient Active Problem List   Diagnosis Date Noted   Stroke ([place]) 11/25/2020   Substance abuse ([place]) 11/25/2020   Abnormal Pap smear of cervix 06/24/2020   Encounter for screening fecal occult blood testing 06/14/2020   Encounter for gynecological examination with Papanicolaou smear of cervix 06/14/2020   Routine medical exam 06/14/2020   Smoker 06/14/2020   Postmenopause 06/14/2020   Constipation 08/03/2014   Current smoker 08/03/2014   HCV infection 08/03/2014   Vaginal discharge 07/24/2014   BV (bacterial vaginosis) 07/24/2014   Atrophic vaginitis 07/24/2014   Loss of weight 01/06/2014   Abdominal pain, unspecified site 01/06/2014   PRURITUS 09/28/2008   TRANSAMINASES, SERUM, ELEVATED 04/07/2008   ECZEMA 03/04/2008   Chronic hepatitis C virus infection ([place]) 12/10/2007   Hypercholesterolemia 12/10/2007   BIPOLAR DISORDER UNSPECIFIED 12/10/2007   TOBACCO ABUSE 12/10/2007   Essential hypertension 12/10/2007   ALLERGIC RHINITIS 12/10/2007   GERD 12/10/2007   LUPUS 12/10/2007   ARTHRITIS 12/10/2007   LOW BACK PAIN 12/10/2007   Pain in limb 12/10/2007   Past Medical History:  Past Medical History:  Diagnosis Date   Atrophic vaginitis 07/24/2014   Bipolar disorder ([place])    BV (bacterial vaginosis) 07/24/2014   Hepatitis C    [place]   Hepatitis C    Herpes    Hyperlipidemia    Hypertension    LGSIL of cervix of undetermined significance 06/24/2020   2/22 will get Colpo   Lupus ([place])    ???   Medical history non-contributory    Vaginal discharge 07/24/2014   Past Surgical History:  Past Surgical History:  Procedure Laterality Date   c section  12/18/1993   CESAREAN SECTION     COLONOSCOPY N/A 12/24/2013   [id number] mucosa in the terminal ileum/TWO colon  polyps removed/ The LEFT colon IS redundant/Small internal hemorrhoids. tubular adenoma. next TCS 12/2018   ESOPHAGOGASTRODUODENOSCOPY N/A 02/06/2014   SLF: 1. No obvious reason for weight loss identified 2. Dyspepsia due to MILD erosive gastritis.   FRACTURE SURGERY Right    20 years ago   HPI:  Brittany Lester  is a 60 y.o. female, with history of bipolar disorder, substance abuse, hepatitis C, hyperlipidemia, hypertension, lupus, and more presents the ED with a chief complaint of right-sided weakness.  Of note symptoms symptom onset, patient did use crack cocaine.  Patient is otherwise at her normal state of health aside from urinary frequency and urgency.  MRI positive for left thalamic infarct. SLE requested.   Assessment / Plan / Recommendation Clinical Impression  Cognitive linguistic evaluation completed at bedside. Pt reports a 7th grade education, previously worked in a kitchen and now on disability, lives at home with her boyfriend, has 2 grown children and 9 grandchildren, and was independent prior to this stroke (medication management, finances, driving, shopping). She indicates that her daily routine includes: drinking coffee, watching television, cleaning the house, eating, and laying around. She was administered the SLUMS ([place] Status) as well as other informal measures to determine current cognitive linguistic skills. She scored a 23/30 with errors on story recall (2/8), digit span reversed, and 5 item recall after a dealy (4/5). She received one extra point for level of education. Pt states that she feels she would not have scored any differently prior to this stroke. She  states that she's "never been a reader" and has trouble "concentrating". Suspect her performance is likely baseline. When allowed repetition (per her request), she achieved WNL scores. Oral motor examination reveals mild right facial asymmetry, however WFL with movement and speech tasks. No family  currently present to confirm baseline. Pt's PCP is Dr. Legrand Rams, and she was advised to follow up with him post discharge if she notes any changes in cognition once she returns home, so that an order for outpatient SLP can be obtained. Pt is not interested in pursuing outpatient SLP services at this time. No further SLP services indicated at this time. SLP will sign off.    SLP Assessment  SLP Recommendation/Assessment: Patient does not need any further Speech Lanaguage Pathology Services SLP Visit Diagnosis: Cognitive communication deficit (R41.841)    Follow Up Recommendations  None    Frequency and Duration           SLP Evaluation Cognition  Overall Cognitive Status: No family/caregiver present to determine baseline cognitive functioning (Pt reports baseline) Arousal/Alertness: Awake/alert Orientation Level: Oriented X4 Attention: Sustained Sustained Attention: Impaired Sustained Attention Impairment: Verbal complex Memory: Impaired Memory Impairment: Storage deficit Awareness: Appears intact Problem Solving: Appears intact Safety/Judgment: Appears intact       Comprehension  Auditory Comprehension Overall Auditory Comprehension: Appears within functional limits for tasks assessed Yes/No Questions: Within Functional Limits Commands: Impaired Complex Commands: 75-100% accurate Conversation: Complex Other Conversation Comments: reduced attention and requested repetition for multistep commands Interfering Components: Attention ("tired") EffectiveTechniques: Repetition Visual Recognition/Discrimination Discrimination: Within Function Limits Reading Comprehension Reading Status: Not tested    Expression Expression Primary Mode of Expression: Verbal Verbal Expression Overall Verbal Expression: Appears within functional limits for tasks assessed Initiation: No impairment Automatic Speech: Name;Social Response;Month of year Level of Generative/Spontaneous Verbalization:  Conversation Repetition: No impairment Naming: No impairment Pragmatics: No impairment Non-Verbal Means of Communication: Not applicable Written Expression Dominant Hand: Right Written Expression:  (able to completed clock drawing with 100% acc)   Oral / Motor  Oral Motor/Sensory Function Overall Oral Motor/Sensory Function: Mild impairment Facial ROM: Within Functional Limits Facial Symmetry: Abnormal symmetry right Motor Speech Overall Motor Speech: Appears within functional limits for tasks assessed Respiration: Within functional limits Phonation: Normal Resonance: Within functional limits Articulation: Within functional limitis Intelligibility: Intelligible Motor Planning: Witnin functional limits Motor Speech Errors: Not applicable   Thank you,  Genene Churn, [place] 11/26/2020, 9:39 AM

## 2020-11-26 NOTE — Plan of Care (Signed)
  Problem: Education: Goal: Knowledge of disease or condition will improve Outcome: Progressing Goal: Knowledge of secondary prevention will improve Outcome: Progressing Goal: Knowledge of patient specific risk factors addressed and post discharge goals established will improve Outcome: Progressing   Problem: Coping: Goal: Will identify appropriate support needs Outcome: Progressing   Problem: Self-Care: Goal: Ability to participate in self-care as condition permits will improve Outcome: Progressing   Problem: Education: Goal: Knowledge of General Education information will improve Description: Including pain rating scale, medication(s)/side effects and non-pharmacologic comfort measures Outcome: Progressing   Problem: Health Behavior/Discharge Planning: Goal: Ability to manage health-related needs will improve Outcome: Progressing

## 2020-11-26 NOTE — Discharge Summary (Signed)
Physician Discharge Summary  Brittany Lester [id number] DOB: 15-Feb-1961 DOA: 11/25/2020  PCP: Rosita Fire, MD  Admit date: 11/25/2020 Discharge date: 11/26/2020  Admitted From: Home Disposition: Home  Recommendations for Outpatient Follow-up:  Follow up with PCP in 1-2 weeks Please obtain BMP/CBC in one week Follow-up with neurology in 8 weeks Homocystine levels and RPR are pending at the time of discharge which will be followed up by neurology  Home Health: Outpatient PT Equipment/Devices:  Discharge Condition: Stable CODE STATUS: Full code Diet recommendation: Heart healthy  Brief/Interim Summary: 60 year old female with history of substance abuse, hypertension, hyperlipidemia, presented with right-sided weakness.  MRI confirmed acute left thalamic infarct.  She was admitted for further work-up  Discharge Diagnoses:  Active Problems:   Hypercholesterolemia   Essential hypertension   Current smoker   Stroke ([place])   Substance abuse ([place])  Acute CVA with right-sided weakness -MRI confirmed thalamic infarct -Started on aspirin and Plavix -Noted to have cerebrovascular disease on, no LVO -Echo and carotid Dopplers unremarkable -Likely cocaine contributing -Seen by neurology with recommendations for dual antiplatelet therapy for the next 1 month followed by single therapy with aspirin -LDL elevated, started on statin -Seen by PT with recommendations for outpatient PT  Hypertension -Continue on lisinopril/hydrochlorothiazide on discharge  Hyperlipidemia -Started on Lipitor  COPD -No shortness of breath or wheezing at this time -Continue bronchodilators  Tobacco use -Discharged with nicotine patch  Polysubstance abuse -Patient consumes alcohol, no signs of alcohol withdrawal during her hospital stay -UDS positive for cocaine -Seen by social work and offered drug rehab resources  Possible UTI -UA with pyuria and multiple bacteria -She was treated with  ceftriaxone, urine culture in process at time of discharge -Transition to cefdinir for empiric coverage   Discharge Instructions  Discharge Instructions     Ambulatory referral to Physical Therapy   Complete by: As directed    Diet - low sodium heart healthy   Complete by: As directed    Increase activity slowly   Complete by: As directed       Allergies as of 11/26/2020       Reactions   Erythromycin    REACTION: GI upset        Medication List     TAKE these medications    albuterol 108 (90 Base) MCG/ACT inhaler Commonly known as: VENTOLIN HFA Inhale 1-2 puffs into the lungs every 4 (four) hours as needed for wheezing or shortness of breath.   aspirin EC 81 MG tablet Take 1 tablet (81 mg total) by mouth daily. Swallow whole.   atorvastatin 40 MG tablet Commonly known as: LIPITOR Take 1 tablet (40 mg total) by mouth daily. Start taking on: November 27, 2020   Breo Ellipta 100-25 MCG/INH Aepb Generic drug: fluticasone furoate-vilanterol Inhale 1 puff into the lungs daily.   cefdinir 300 MG capsule Commonly known as: OMNICEF Take 1 capsule (300 mg total) by mouth 2 (two) times daily.   clopidogrel 75 MG tablet Commonly known as: PLAVIX Take 1 tablet (75 mg total) by mouth daily. Start taking on: November 27, 2020   hydrOXYzine 25 MG capsule Commonly known as: Vistaril Take 1 capsule (25 mg total) by mouth every 8 (eight) hours as needed. What changed: reasons to take this   lisinopril-hydrochlorothiazide 20-12.5 MG tablet Commonly known as: ZESTORETIC Take 1 tablet by mouth daily.   nicotine 14 mg/24hr patch Commonly known as: NICODERM CQ - dosed in mg/24 hours Place 1 patch (14 mg total) onto the  skin daily. Start taking on: November 27, 2020               Durable Medical Equipment  (From admission, onward)           Start     Ordered   11/26/20 1932  For home use only DME Shower stool  Once       Comments: Ship to the home   11/26/20 [facility] Follow up.   Specialty: Rehabilitation Contact information: [id number] [place] [address] [phone]               Allergies  Allergen Reactions   Erythromycin     REACTION: GI upset    Consultations: Neurology   Procedures/Studies: MR ANGIO HEAD WO CONTRAST  Result Date: 11/25/2020 EXAM: MRI HEAD WITHOUT CONTRAST MRA HEAD WITHOUT CONTRAST TECHNIQUE: Multiplanar, multi-echo pulse sequences of the brain and surrounding structures were acquired without intravenous contrast. Angiographic images of the Circle of Willis were acquired using MRA technique without intravenous contrast. COMPARISON:  No pertinent prior exam. FINDINGS: MRI HEAD FINDINGS The examination had to be discontinued prior to completion due to claustrophobia. Within this limitation: Brain: Acute left thalamic infarct. Associated edema without significant mass effect. No acute hemorrhage, hydrocephalus, mass lesion, or extra-axial fluid collection. No midline shift. Mild scattered T2/FLAIR hyperintensities within the white matter, nonspecific but most likely related to chronic microvascular ischemic disease. Dilated perivascular spaces in bilateral inferior basal ganglia. Vascular: See below. Skull and upper cervical spine: Normal marrow signal. Sinuses/Orbits: Moderate left maxillary sinus mucosal thickening Other: No mastoid effusions. MRA HEAD FINDINGS Anterior circulation: Bilateral intracranial ICAs, MCAs, and ACAs are patent without evidence of a proximal hemodynamically significant stenosis. Mild right greater than left cavernous and paraclinoid ICA narrowing. Evaluation of the distal ACA and MCA branches is limited due to artifact. Possible moderate stenosis of a right M2 MCA branch. Posterior circulation: Right dominant intradural vertebral artery. Visualized intradural vertebral  arteries are patent without significant stenosis. The visualized basilar artery is patent without significant stenosis. Short segment severe stenosis versus occlusion of the left P2 PCA with flow related signal in the more distal PCA. Moderate stenosis of the right P2 PCA. IMPRESSION: MRI: 1. Acute left thalamic infarct. Associated edema without significant mass effect. 2. Mild chronic microvascular ischemic disease. 3. Moderate left maxillary sinus mucosal thickening. MRA: 1. Short segment severe stenosis versus occlusion of the left P2 PCA with flow related signal in the more distal PCA. 2. Moderate stenosis of the right P2 PCA. 3. Possible moderate stenosis of a right M2 MCA branch. Electronically Signed   By: Margaretha Sheffield MD   On: 11/25/2020 17:28   MR BRAIN WO CONTRAST  Result Date: 11/25/2020 EXAM: MRI HEAD WITHOUT CONTRAST MRA HEAD WITHOUT CONTRAST TECHNIQUE: Multiplanar, multi-echo pulse sequences of the brain and surrounding structures were acquired without intravenous contrast. Angiographic images of the Circle of Willis were acquired using MRA technique without intravenous contrast. COMPARISON:  No pertinent prior exam. FINDINGS: MRI HEAD FINDINGS The examination had to be discontinued prior to completion due to claustrophobia. Within this limitation: Brain: Acute left thalamic infarct. Associated edema without significant mass effect. No acute hemorrhage, hydrocephalus, mass lesion, or extra-axial fluid collection. No midline shift. Mild scattered T2/FLAIR hyperintensities within the white matter, nonspecific but most likely related to chronic microvascular  ischemic disease. Dilated perivascular spaces in bilateral inferior basal ganglia. Vascular: See below. Skull and upper cervical spine: Normal marrow signal. Sinuses/Orbits: Moderate left maxillary sinus mucosal thickening Other: No mastoid effusions. MRA HEAD FINDINGS Anterior circulation: Bilateral intracranial ICAs, MCAs, and ACAs are  patent without evidence of a proximal hemodynamically significant stenosis. Mild right greater than left cavernous and paraclinoid ICA narrowing. Evaluation of the distal ACA and MCA branches is limited due to artifact. Possible moderate stenosis of a right M2 MCA branch. Posterior circulation: Right dominant intradural vertebral artery. Visualized intradural vertebral arteries are patent without significant stenosis. The visualized basilar artery is patent without significant stenosis. Short segment severe stenosis versus occlusion of the left P2 PCA with flow related signal in the more distal PCA. Moderate stenosis of the right P2 PCA. IMPRESSION: MRI: 1. Acute left thalamic infarct. Associated edema without significant mass effect. 2. Mild chronic microvascular ischemic disease. 3. Moderate left maxillary sinus mucosal thickening. MRA: 1. Short segment severe stenosis versus occlusion of the left P2 PCA with flow related signal in the more distal PCA. 2. Moderate stenosis of the right P2 PCA. 3. Possible moderate stenosis of a right M2 MCA branch. Electronically Signed   By: Margaretha Sheffield MD   On: 11/25/2020 17:28   US Carotid Bilateral (at [facility] and [facility] only)  Result Date: 11/26/2020 CLINICAL DATA:  Hypertension, stroke symptoms, hyperlipidemia and current smoker EXAM: BILATERAL CAROTID DUPLEX ULTRASOUND TECHNIQUE: Pearline Cables scale imaging, color Doppler and duplex ultrasound were performed of bilateral carotid and vertebral arteries in the neck. COMPARISON:  None. FINDINGS: Criteria: Quantification of carotid stenosis is based on velocity parameters that correlate the residual internal carotid diameter with NASCET-based stenosis levels, using the diameter of the distal internal carotid lumen as the denominator for stenosis measurement. The following velocity measurements were obtained: RIGHT ICA: 120/42 cm/sec CCA: [id number] cm/sec SYSTOLIC ICA/CCA RATIO:  1.3 ECA: 132 cm/sec LEFT ICA: 134/54 cm/sec CCA: [id number]  cm/sec SYSTOLIC ICA/CCA RATIO:  1.4 ECA: 108 cm/sec RIGHT CAROTID ARTERY: Intimal thickening and bifurcation hypoechoic plaque formation. Despite this, no significant stenosis, velocity elevation, or turbulent flow. Degree of narrowing less than 50% by ultrasound criteria. RIGHT VERTEBRAL ARTERY:  Normal antegrade flow LEFT CAROTID ARTERY: Similar intimal thickening and hypoechoic plaque formation. Minor bifurcation calcification. No significant stenosis, velocity elevation, turbulent flow. Degree of narrowing less than 50% by ultrasound criteria. LEFT VERTEBRAL ARTERY:  Normal antegrade flow IMPRESSION: Bilateral carotid atherosclerosis. Negative for stenosis. Degree of narrowing less than 50% bilaterally by ultrasound criteria. Patent antegrade vertebral flow bilaterally Electronically Signed   By: Jerilynn Mages.  Shick M.D.   On: 11/26/2020 12:33   ECHOCARDIOGRAM COMPLETE  Result Date: 11/26/2020    ECHOCARDIOGRAM REPORT   Patient Name:   Brittany Lester Date of Exam: 11/26/2020 Medical Rec #:  [id number]       Height:       62.0 in Accession #:    [id number]      Weight:       95.0 lb Date of Birth:  09/21/1960        BSA:          1.393 m Patient Age:    66 years        BP:           114/74 mmHg Patient Gender: F               HR:           60 bpm. Exam Location:  [facility] Procedure: 2D Echo, Cardiac Doppler and Color Doppler Indications:    Stroke  History:        Patient has no prior history of Echocardiogram examinations.                 Risk Factors:Hypertension and Current Smoker.  Sonographer:    Wenda Low Referring Phys: [id number] ASIA B [place]  1. Left ventricular ejection fraction, by estimation, is 60 to 65%. The left ventricle has normal function. The left ventricle has no regional wall motion abnormalities. There is mild asymmetric left ventricular hypertrophy of the basal segment. Left ventricular diastolic parameters were normal.  2. Right ventricular systolic function is normal. The  right ventricular size is normal. Tricuspid regurgitation signal is inadequate for assessing PA pressure.  3. The mitral valve is grossly normal. Trivial mitral valve regurgitation.  4. The aortic valve is tricuspid. Aortic valve regurgitation is not visualized. Aortic valve mean gradient measures 2.0 mmHg.  5. The inferior vena cava is normal in size with greater than 50% respiratory variability, suggesting right atrial pressure of 3 mmHg. FINDINGS  Left Ventricle: Left ventricular ejection fraction, by estimation, is 60 to 65%. The left ventricle has normal function. The left ventricle has no regional wall motion abnormalities. The left ventricular internal cavity size was normal in size. There is  mild asymmetric left ventricular hypertrophy of the basal segment. Left ventricular diastolic parameters were normal. Right Ventricle: The right ventricular size is normal. No increase in right ventricular wall thickness. Right ventricular systolic function is normal. Tricuspid regurgitation signal is inadequate for assessing PA pressure. The tricuspid regurgitant velocity is 1.42 m/s, and with an assumed right atrial pressure of 3 mmHg, the estimated right ventricular systolic pressure is [id number] mmHg. Left Atrium: Left atrial size was normal in size. Right Atrium: Right atrial size was normal in size. Pericardium: There is no evidence of pericardial effusion. Mitral Valve: The mitral valve is grossly normal. There is mild calcification of the mitral valve leaflet(s). Trivial mitral valve regurgitation. MV peak gradient, 1.7 mmHg. The mean mitral valve gradient is 1.0 mmHg. Tricuspid Valve: The tricuspid valve is grossly normal. Tricuspid valve regurgitation is trivial. Aortic Valve: The aortic valve is tricuspid. There is mild aortic valve annular calcification. Aortic valve regurgitation is not visualized. Aortic valve mean gradient measures 2.0 mmHg. Aortic valve peak gradient measures 4.1 mmHg. Aortic valve area, by   VTI measures 2.22 cm. Pulmonic Valve: The pulmonic valve was not well visualized. Pulmonic valve regurgitation is not visualized. Aorta: The aortic root is normal in size and structure. Venous: The inferior vena cava is normal in size with greater than 50% respiratory variability, suggesting right atrial pressure of 3 mmHg. IAS/Shunts: No atrial level shunt detected by color flow Doppler.  LEFT VENTRICLE PLAX 2D LVIDd:         3.30 cm  Diastology LVIDs:         2.45 cm  LV e' medial:   7.89 cm/s LV PW:         0.87 cm  LV E/e' medial: 8.5 LV IVS:        1.12 cm LVOT diam:     1.90 cm LV SV:         40 LV SV Index:   29 LVOT Area:     2.84 cm  RIGHT VENTRICLE RV Basal diam:  2.72 cm RV Mid diam:    2.18 cm RV S prime:     12.90  cm/s TAPSE (M-mode): 2.4 cm LEFT ATRIUM             Index       RIGHT ATRIUM          Index LA diam:        2.60 cm 1.87 cm/m  RA Area:     7.41 cm LA Vol (A2C):   36.1 ml 25.91 ml/m RA Volume:   12.30 ml 8.83 ml/m LA Vol (A4C):   22.6 ml 16.22 ml/m LA Biplane Vol: 29.8 ml 21.39 ml/m  AORTIC VALVE AV Area (Vmax):    2.46 cm AV Area (Vmean):   2.28 cm AV Area (VTI):     2.22 cm AV Vmax:           101.00 cm/s AV Vmean:          66.600 cm/s AV VTI:            0.180 m AV Peak Grad:      4.1 mmHg AV Mean Grad:      2.0 mmHg LVOT Vmax:         87.70 cm/s LVOT Vmean:        53.500 cm/s LVOT VTI:          0.141 m LVOT/AV VTI ratio: 0.78  AORTA Ao Root diam: 2.70 cm MITRAL VALVE               TRICUSPID VALVE MV Area (PHT): 3.17 cm    TR Peak grad:   8.1 mmHg MV Area VTI:   2.06 cm    TR Vmax:        142.00 cm/s MV Peak grad:  1.7 mmHg MV Mean grad:  1.0 mmHg    SHUNTS MV Vmax:       0.65 m/s    Systemic VTI:  0.14 m MV Vmean:      39.0 cm/s   Systemic Diam: 1.90 cm MV Decel Time: 239 msec MV E velocity: 66.80 cm/s MV A velocity: 43.70 cm/s MV E/A ratio:  1.53 MV A Prime:    11.0 cm/s Rozann Lesches MD Electronically signed by Rozann Lesches MD Signature Date/Time: 11/26/2020/11:13:59 AM     Final       Subjective: Feels right-sided weakness improving.  Discharge Exam: Vitals:   11/26/20 0602 11/26/20 0734 11/26/20 0900 11/26/20 1200  BP: 114/74  119/80 120/74  Pulse: 60  81 73  Resp: 16   16  Temp: 97.6 F (36.4 C)   98.6 F (37 C)  TempSrc: Oral   Oral  SpO2: 100% 100%  100%  Weight:      Height:        General: Pt is alert, awake, not in acute distress Cardiovascular: RRR, S1/S2 +, no rubs, no gallops Respiratory: CTA bilaterally, no wheezing, no rhonchi Abdominal: Soft, NT, ND, bowel sounds + Extremities: Right hand remains weak, although she feels with some improvement, continues to have some right lower extremity weakness, but able to ambulate    The results of significant diagnostics from this hospitalization (including imaging, microbiology, ancillary and laboratory) are listed below for reference.     Microbiology: Recent Results (from the past 240 hour(s))  Resp Panel by RT-PCR (Flu A&B, Covid) Nasopharyngeal Swab     Status: None   Collection Time: 11/25/20  1:41 PM   Specimen: Nasopharyngeal Swab; Nasopharyngeal(NP) swabs in vial transport medium  Result Value Ref Range Status   SARS Coronavirus 2 by RT PCR NEGATIVE NEGATIVE Final  Comment: (NOTE) SARS-CoV-2 target nucleic acids are NOT DETECTED.  The SARS-CoV-2 RNA is generally detectable in upper respiratory specimens during the acute phase of infection. The lowest concentration of SARS-CoV-2 viral copies this assay can detect is 138 copies/mL. A negative result does not preclude SARS-Cov-2 infection and should not be used as the sole basis for treatment or other patient management decisions. A negative result may occur with  improper specimen collection/handling, submission of specimen other than nasopharyngeal swab, presence of viral mutation(s) within the areas targeted by this assay, and inadequate number of viral copies(<138 copies/mL). A negative result must be combined  with clinical observations, patient history, and epidemiological information. The expected result is Negative.  Fact Sheet for Patients:  EntrepreneurPulse.com.au  Fact Sheet for Healthcare Providers:  IncredibleEmployment.be  This test is no t yet approved or cleared by the [place] FDA and  has been authorized for detection and/or diagnosis of SARS-CoV-2 by FDA under an Emergency Use Authorization (EUA). This EUA will remain  in effect (meaning this test can be used) for the duration of the COVID-19 declaration under Section 564(b)(1) of the Act, 21 U.S.C.section 360bbb-3(b)(1), unless the authorization is terminated  or revoked sooner.       Influenza A by PCR NEGATIVE NEGATIVE Final   Influenza B by PCR NEGATIVE NEGATIVE Final    Comment: (NOTE) The Xpert Xpress SARS-CoV-2/FLU/RSV plus assay is intended as an aid in the diagnosis of influenza from Nasopharyngeal swab specimens and should not be used as a sole basis for treatment. Nasal washings and aspirates are unacceptable for Xpert Xpress SARS-CoV-2/FLU/RSV testing.  Fact Sheet for Patients: EntrepreneurPulse.com.au  Fact Sheet for Healthcare Providers: IncredibleEmployment.be  This test is not yet approved or cleared by the [place] FDA and has been authorized for detection and/or diagnosis of SARS-CoV-2 by FDA under an Emergency Use Authorization (EUA). This EUA will remain in effect (meaning this test can be used) for the duration of the COVID-19 declaration under Section 564(b)(1) of the Act, 21 U.S.C. section 360bbb-3(b)(1), unless the authorization is terminated or revoked.  Performed at [facility], [address], [place], [place] [address]      Labs: BNP (last 3 results) No results for input(s): BNP in the last 8760 hours. Basic Metabolic Panel: Recent Labs  Lab 11/25/20 1205 11/25/20 1417 11/26/20 0428  NA 135 136  137  K 4.0 4.0 3.6  CL 102 104 103  CO2 26  --  26  GLUCOSE 95 94 97  BUN 21* 20 23*  CREATININE 0.78 0.70 0.75  CALCIUM 9.4  --  9.1   Liver Function Tests: Recent Labs  Lab 11/25/20 1205 11/26/20 0428  AST 35 26  ALT 29 23  ALKPHOS 76 70  BILITOT 0.8 0.5  PROT 7.8 6.9  ALBUMIN 4.2 3.6   No results for input(s): LIPASE, AMYLASE in the last 168 hours. No results for input(s): AMMONIA in the last 168 hours. CBC: Recent Labs  Lab 11/25/20 1205 11/25/20 1417 11/26/20 0428  WBC 6.0  --  5.2  NEUTROABS 3.4  --   --   HGB 13.5 13.9 13.1  HCT 40.3 41.0 40.2  MCV 93.5  --  92.6  PLT 218  --  218   Cardiac Enzymes: No results for input(s): CKTOTAL, CKMB, CKMBINDEX, TROPONINI in the last 168 hours. BNP: Invalid input(s): POCBNP CBG: No results for input(s): GLUCAP in the last 168 hours. D-Dimer No results for input(s): DDIMER in the last 72 hours.  Hgb A1c No results for input(s): HGBA1C in the last 72 hours. Lipid Profile Recent Labs    11/26/20 0428  CHOL 274*  HDL 79  LDLCALC 174*  TRIG 107  CHOLHDL 3.5   Thyroid function studies Recent Labs    11/26/20 1741  TSH 1.420   Anemia work up Recent Labs    11/26/20 1741  VITAMINB12 568   Urinalysis    Component Value Date/Time   COLORURINE YELLOW 11/25/2020 1341   APPEARANCEUR HAZY (A) 11/25/2020 1341   LABSPEC 1.012 11/25/2020 1341   PHURINE 6.0 11/25/2020 1341   GLUCOSEU NEGATIVE 11/25/2020 1341   HGBUR SMALL (A) 11/25/2020 1341   BILIRUBINUR NEGATIVE 11/25/2020 1341   KETONESUR NEGATIVE 11/25/2020 1341   PROTEINUR NEGATIVE 11/25/2020 1341   NITRITE NEGATIVE 11/25/2020 1341   LEUKOCYTESUR LARGE (A) 11/25/2020 1341   Sepsis Labs Invalid input(s): PROCALCITONIN,  WBC,  LACTICIDVEN Microbiology Recent Results (from the past 240 hour(s))  Resp Panel by RT-PCR (Flu A&B, Covid) Nasopharyngeal Swab     Status: None   Collection Time: 11/25/20  1:41 PM   Specimen: Nasopharyngeal Swab;  Nasopharyngeal(NP) swabs in vial transport medium  Result Value Ref Range Status   SARS Coronavirus 2 by RT PCR NEGATIVE NEGATIVE Final    Comment: (NOTE) SARS-CoV-2 target nucleic acids are NOT DETECTED.  The SARS-CoV-2 RNA is generally detectable in upper respiratory specimens during the acute phase of infection. The lowest concentration of SARS-CoV-2 viral copies this assay can detect is 138 copies/mL. A negative result does not preclude SARS-Cov-2 infection and should not be used as the sole basis for treatment or other patient management decisions. A negative result may occur with  improper specimen collection/handling, submission of specimen other than nasopharyngeal swab, presence of viral mutation(s) within the areas targeted by this assay, and inadequate number of viral copies(<138 copies/mL). A negative result must be combined with clinical observations, patient history, and epidemiological information. The expected result is Negative.  Fact Sheet for Patients:  EntrepreneurPulse.com.au  Fact Sheet for Healthcare Providers:  IncredibleEmployment.be  This test is no t yet approved or cleared by the [place] FDA and  has been authorized for detection and/or diagnosis of SARS-CoV-2 by FDA under an Emergency Use Authorization (EUA). This EUA will remain  in effect (meaning this test can be used) for the duration of the COVID-19 declaration under Section 564(b)(1) of the Act, 21 U.S.C.section 360bbb-3(b)(1), unless the authorization is terminated  or revoked sooner.       Influenza A by PCR NEGATIVE NEGATIVE Final   Influenza B by PCR NEGATIVE NEGATIVE Final    Comment: (NOTE) The Xpert Xpress SARS-CoV-2/FLU/RSV plus assay is intended as an aid in the diagnosis of influenza from Nasopharyngeal swab specimens and should not be used as a sole basis for treatment. Nasal washings and aspirates are unacceptable for Xpert Xpress  SARS-CoV-2/FLU/RSV testing.  Fact Sheet for Patients: EntrepreneurPulse.com.au  Fact Sheet for Healthcare Providers: IncredibleEmployment.be  This test is not yet approved or cleared by the [place] FDA and has been authorized for detection and/or diagnosis of SARS-CoV-2 by FDA under an Emergency Use Authorization (EUA). This EUA will remain in effect (meaning this test can be used) for the duration of the COVID-19 declaration under Section 564(b)(1) of the Act, 21 U.S.C. section 360bbb-3(b)(1), unless the authorization is terminated or revoked.  Performed at [facility], [address], [place], [place] [address]      Time coordinating discharge: 11mns  SIGNED:  Kathie Dike, MD  [facility] 11/26/2020, 7:45 PM   If 7PM-7AM, please contact night-coverage [email or web address]

## 2020-11-26 NOTE — Progress Notes (Signed)
Nsg Discharge Note  Admit Date:  11/25/2020 Discharge date: 11/26/2020   Brittany Lester to be D/C'd Home per MD order.  AVS completed.  Copy for chart, and copy for patient signed, and dated. Removed IV-CDI. Reviewed d/c paperwork with patient and answered all questions. Gave Code 44 paperwork. Wheeled stable patient and belongings to main entrance where she was picked up by her son to d/c to home. Patient/caregiver able to verbalize understanding.  Discharge Medication: Allergies as of 11/26/2020       Reactions   Erythromycin    REACTION: GI upset        Medication List     TAKE these medications    albuterol 108 (90 Base) MCG/ACT inhaler Commonly known as: VENTOLIN HFA Inhale 1-2 puffs into the lungs every 4 (four) hours as needed for wheezing or shortness of breath.   aspirin EC 81 MG tablet Take 1 tablet (81 mg total) by mouth daily. Swallow whole.   atorvastatin 40 MG tablet Commonly known as: LIPITOR Take 1 tablet (40 mg total) by mouth daily. Start taking on: November 27, 2020   Breo Ellipta 100-25 MCG/INH Aepb Generic drug: fluticasone furoate-vilanterol Inhale 1 puff into the lungs daily.   cefdinir 300 MG capsule Commonly known as: OMNICEF Take 1 capsule (300 mg total) by mouth 2 (two) times daily.   clopidogrel 75 MG tablet Commonly known as: PLAVIX Take 1 tablet (75 mg total) by mouth daily. Start taking on: November 27, 2020   hydrOXYzine 25 MG capsule Commonly known as: Vistaril Take 1 capsule (25 mg total) by mouth every 8 (eight) hours as needed. What changed: reasons to take this   lisinopril-hydrochlorothiazide 20-12.5 MG tablet Commonly known as: ZESTORETIC Take 1 tablet by mouth daily.   nicotine 14 mg/24hr patch Commonly known as: NICODERM CQ - dosed in mg/24 hours Place 1 patch (14 mg total) onto the skin daily. Start taking on: November 27, 2020               Durable Medical Equipment  (From admission, onward)           Start      Ordered   11/26/20 1932  For home use only DME Shower stool  Once       Comments: Ship to the home   11/26/20 1932            Discharge Assessment: Vitals:   11/26/20 0900 11/26/20 1200  BP: 119/80 120/74  Pulse: 81 73  Resp:  16  Temp:  98.6 F (37 C)  SpO2:  100%   Skin clean, dry and intact without evidence of skin break down, no evidence of skin tears noted. IV catheter discontinued intact. Site without signs and symptoms of complications - no redness or edema noted at insertion site, patient denies c/o pain - only slight tenderness at site.  Dressing with slight pressure applied.  D/c Instructions-Education: Discharge instructions given to patient/family with verbalized understanding. D/c education completed with patient/family including follow up instructions, medication list, d/c activities limitations if indicated, with other d/c instructions as indicated by MD - patient able to verbalize understanding, all questions fully answered. Patient instructed to return to ED, call 911, or call MD for any changes in condition.  Patient escorted via [place], and D/C home via private auto.  Santa Lighter, RN 11/26/2020 8:04 PM

## 2020-11-26 NOTE — Consult Note (Signed)
[place] A. Merlene Laughter, MD     [email or web address]          Brittany Lester is an 60 y.o. female.   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 Risk factors hypertension, dyslipidemia, age and cocaine use.  Gradual blood pressure control is recommended along with use of a statin and optimize control of dyslipidemia. Dual antiplatelet agents are recommended for 1 month afterwards aspirin is recommended.     The patient is a 60 year old right-handed white female who presents to the hospital with a 2 day history of marked weakness involving the right side. The patient had significant difficulties using the right hand and ambulating. She also had sensory deficit in same distribution. The patient reports that she may have had some mild difficulty speaking although this was mostly related to the patient by others but not the patient herself. She woke up about 2 days ago with severe neck discomfort which started the problem. Afterwards on awakening she noticed that the right side was weak. She does not report loss of consciousness, headaches, palpitation, dyspnea or were dizziness. She typically takes lisinopril for blood pressure was apparently is relatively well controlled. She is relatively new to this area. There is a history of a nicotine and cocaine use.  She is noted to have contracture of the right hand which started after the current symptoms. She also is noted to have mild ptosis on the right. She tells me that the ptosis and the contracture of the right hand started after her stroke symptoms. She reports that the ptosis actually fluctuates between near complete and the mild partial ptosis associated with tearing. The review systems otherwise negative.     GENERAL: This is a thin pleasant lady who is somewhat restless. She is in no acute distress.  HEENT:  Mild ptosis noted on the right. Pupils appears to be  equal and reactive.  ABDOMEN: soft  EXTREMITIES: No edema   BACK: Normal  SKIN: Normal by inspection.    MENTAL STATUS: Alert and oriented -  include this in her age and the month correctly. Speech, language and cognition are generally intact. Judgment and insight normal.   CRANIAL NERVES: Pupils are equal, round and reactive to light and accomodation; extra ocular movements are full, there is no significant nystagmus; visual fields are full;  Mild right ptosis; otherwise upper and lower facial muscles are normal in strength and symmetric, there is no flattening of the nasolabial folds; tongue is midline; uvula is midline; shoulder elevation is normal.  MOTOR:  there is mild contracture of the right hand. There is mild drift of the right upper extremity. There is a moderately impaired hand dexterity of the right hand. Triceps is graded as 4-, deltoid 5 and hand grip 4+. The right lower extremity shows 4+/ 5 weakness proximally and distally. There is mild drift. The left side shows normal tone, bulk and strength.  COORDINATION: Left finger to nose is normal, right finger to nose is normal, No rest tremor; no intention tremor; no postural tremor; no bradykinesia.  REFLEXES: Deep tendon reflexes are symmetrical and normal.   SENSATION:  Mild impairment sensation to temperature light touch R upper extremity right upper extremity and right leg.  GAIT:  This is slightly unsteady.    NIH stroke scale 1, 1, 1, 1 total 4.   Blood pressure 120/74, pulse 73, temperature 98.6 F (37 C), temperature source Oral, resp. rate  16, height '5\' 2"'$  (1.575 m), weight 43.1 kg, SpO2 100 %.  Past Medical History:  Diagnosis Date   Atrophic vaginitis 07/24/2014   Bipolar disorder ([place])    BV (bacterial vaginosis) 07/24/2014   Hepatitis C    [place]   Hepatitis C    Herpes    Hyperlipidemia    Hypertension    LGSIL of cervix of undetermined significance 06/24/2020   2/22 will get Colpo   Lupus ([place])     ???   Medical history non-contributory    Vaginal discharge 07/24/2014    Past Surgical History:  Procedure Laterality Date   c section  12/18/1993   CESAREAN SECTION     COLONOSCOPY N/A 12/24/2013   [id number] mucosa in the terminal ileum/TWO colon polyps removed/ The LEFT colon IS redundant/Small internal hemorrhoids. tubular adenoma. next TCS 12/2018   ESOPHAGOGASTRODUODENOSCOPY N/A 02/06/2014   SLF: 1. No obvious reason for weight loss identified 2. Dyspepsia due to MILD erosive gastritis.   FRACTURE SURGERY Right    20 years ago    Family History  Problem Relation Age of Onset   Colon cancer Maternal Aunt        age 10   Heart attack Mother    Thyroid disease Mother    Heart attack Father    Alcohol abuse Father    Depression Father    Heart disease Sister    Thyroid disease Sister    Bipolar disorder Sister    Cancer Sister        lung   Drug abuse Sister    Bipolar disorder Brother    Drug abuse Brother    Drug abuse Son    Liver disease Neg Hx     Social History:  reports that she has been smoking cigarettes. She has a 10.00 pack-year smoking history. She has never used smokeless tobacco. She reports current alcohol use. She reports that she does not use drugs.  Allergies:  Allergies  Allergen Reactions   Erythromycin     REACTION: GI upset    Medications: Prior to Admission medications   Medication Sig Start Date End Date Taking? Authorizing Provider  albuterol (VENTOLIN HFA) 108 (90 Base) MCG/ACT inhaler Inhale 1-2 puffs into the lungs every 4 (four) hours as needed for wheezing or shortness of breath. 07/29/20  Yes [provider]  BREO ELLIPTA 100-25 MCG/INH AEPB Inhale 1 puff into the lungs daily. 07/21/20  Yes [provider]  hydrOXYzine (VISTARIL) 25 MG capsule Take 1 capsule (25 mg total) by mouth every 8 (eight) hours as needed. Patient taking differently: Take 25 mg by mouth every 8 (eight) hours as needed for anxiety. 06/30/20  Yes  Derrek Monaco A, NP  lisinopril-hydrochlorothiazide (ZESTORETIC) 20-12.5 MG tablet Take 1 tablet by mouth daily. 10/10/20  Yes [provider]    Scheduled Meds:  aspirin  325 mg Oral Daily   atorvastatin  40 mg Oral Daily   clopidogrel  75 mg Oral Daily   fluticasone furoate-vilanterol  1 puff Inhalation Daily   folic acid  1 mg Oral Daily   heparin  5,000 Units Subcutaneous Q8H   lisinopril  20 mg Oral Daily   And   hydrochlorothiazide  12.5 mg Oral Daily   multivitamin with minerals  1 tablet Oral Daily   nicotine  14 mg Transdermal Daily   thiamine  100 mg Oral Daily   Or   thiamine  100 mg Intravenous Daily   Continuous Infusions:  cefTRIAXone (ROCEPHIN)  IV     PRN Meds:.acetaminophen **OR** acetaminophen (TYLENOL) oral liquid 160 mg/5 mL **OR** acetaminophen, albuterol, hydrOXYzine, LORazepam **OR** LORazepam, senna-docusate     Results for orders placed or performed during the hospital encounter of 11/25/20 (from the past 48 hour(s))  Protime-INR     Status: None   Collection Time: 11/25/20 12:05 PM  Result Value Ref Range   Prothrombin Time 12.1 11.4 - 15.2 seconds   INR 0.9 0.8 - 1.2    Comment: (NOTE) INR goal varies based on device and disease states. Performed at [facility], [address] [place], [place] [address]   APTT     Status: None   Collection Time: 11/25/20 12:05 PM  Result Value Ref Range   aPTT 28 24 - 36 seconds    Comment: Performed at [facility], [address] [place], [place] [address]  CBC     Status: None   Collection Time: 11/25/20 12:05 PM  Result Value Ref Range   WBC 6.0 4.0 - 10.5 K/uL   RBC 4.31 3.87 - 5.11 MIL/uL   Hemoglobin 13.5 12.0 - 15.0 g/dL   HCT 40.3 36.0 - 46.0 %   MCV 93.5 80.0 - 100.0 fL   MCH 31.3 26.0 - 34.0 pg   MCHC 33.5 30.0 - 36.0 g/dL   RDW 12.8 11.5 - 15.5 %   Platelets 218 150 - 400 K/uL   nRBC 0.0 0.0 - 0.2 %    Comment: Performed at [facility], [address], [place], [place]  [address]  Differential     Status: None   Collection Time: 11/25/20 12:05 PM  Result Value Ref Range   Neutrophils Relative % 57 %   Neutro Abs 3.4 1.7 - 7.7 K/uL   Lymphocytes Relative 29 %   Lymphs Abs 1.7 0.7 - 4.0 K/uL   Monocytes Relative 10 %   Monocytes Absolute 0.6 0.1 - 1.0 K/uL   Eosinophils Relative 3 %   Eosinophils Absolute 0.2 0.0 - 0.5 K/uL   Basophils Relative 1 %   Basophils Absolute 0.1 0.0 - 0.1 K/uL   Immature Granulocytes 0 %   Abs Immature Granulocytes 0.01 0.00 - 0.07 K/uL    Comment: Performed at [facility], [address] [place], [place] [address]  Comprehensive metabolic panel     Status: Abnormal   Collection Time: 11/25/20 12:05 PM  Result Value Ref Range   Sodium 135 135 - 145 mmol/L   Potassium 4.0 3.5 - 5.1 mmol/L   Chloride 102 98 - 111 mmol/L   CO2 26 22 - 32 mmol/L   Glucose, Bld 95 70 - 99 mg/dL    Comment: Glucose reference range applies only to samples taken after fasting for at least 8 hours.   BUN 21 (H) 6 - 20 mg/dL   Creatinine, Ser 0.78 0.44 - 1.00 mg/dL   Calcium 9.4 8.9 - 10.3 mg/dL   Total Protein 7.8 6.5 - 8.1 g/dL   Albumin 4.2 3.5 - 5.0 g/dL   AST 35 15 - 41 U/L   ALT 29 0 - 44 U/L   Alkaline Phosphatase 76 38 - 126 U/L   Total Bilirubin 0.8 0.3 - 1.2 mg/dL   GFR, Estimated >60 >60 mL/min    Comment: (NOTE) Calculated using the CKD-EPI Creatinine Equation (2021)    Anion gap 7 5 - 15    Comment: Performed at [facility], [address] [place], [place] [address]  Resp Panel  by RT-PCR (Flu A&B, Covid) Nasopharyngeal Swab     Status: None   Collection Time: 11/25/20  1:41 PM   Specimen: Nasopharyngeal Swab; Nasopharyngeal(NP) swabs in vial transport medium  Result Value Ref Range   SARS Coronavirus 2 by RT PCR NEGATIVE NEGATIVE    Comment: (NOTE) SARS-CoV-2 target nucleic acids are NOT DETECTED.  The SARS-CoV-2 RNA is generally detectable in upper respiratory specimens during the acute phase of infection. The  lowest concentration of SARS-CoV-2 viral copies this assay can detect is 138 copies/mL. A negative result does not preclude SARS-Cov-2 infection and should not be used as the sole basis for treatment or other patient management decisions. A negative result may occur with  improper specimen collection/handling, submission of specimen other than nasopharyngeal swab, presence of viral mutation(s) within the areas targeted by this assay, and inadequate number of viral copies(<138 copies/mL). A negative result must be combined with clinical observations, patient history, and epidemiological information. The expected result is Negative.  Fact Sheet for Patients:  EntrepreneurPulse.com.au  Fact Sheet for Healthcare Providers:  IncredibleEmployment.be  This test is no t yet approved or cleared by the [place] FDA and  has been authorized for detection and/or diagnosis of SARS-CoV-2 by FDA under an Emergency Use Authorization (EUA). This EUA will remain  in effect (meaning this test can be used) for the duration of the COVID-19 declaration under Section 564(b)(1) of the Act, 21 U.S.C.section 360bbb-3(b)(1), unless the authorization is terminated  or revoked sooner.       Influenza A by PCR NEGATIVE NEGATIVE   Influenza B by PCR NEGATIVE NEGATIVE    Comment: (NOTE) The Xpert Xpress SARS-CoV-2/FLU/RSV plus assay is intended as an aid in the diagnosis of influenza from Nasopharyngeal swab specimens and should not be used as a sole basis for treatment. Nasal washings and aspirates are unacceptable for Xpert Xpress SARS-CoV-2/FLU/RSV testing.  Fact Sheet for Patients: EntrepreneurPulse.com.au  Fact Sheet for Healthcare Providers: IncredibleEmployment.be  This test is not yet approved or cleared by the [place] FDA and has been authorized for detection and/or diagnosis of SARS-CoV-2 by FDA under an Emergency  Use Authorization (EUA). This EUA will remain in effect (meaning this test can be used) for the duration of the COVID-19 declaration under Section 564(b)(1) of the Act, 21 U.S.C. section 360bbb-3(b)(1), unless the authorization is terminated or revoked.  Performed at [facility], [address], [place], [place] [address]   Urine rapid drug screen (hosp performed)     Status: Abnormal   Collection Time: 11/25/20  1:41 PM  Result Value Ref Range   Opiates NONE DETECTED NONE DETECTED   Cocaine POSITIVE (A) NONE DETECTED   Benzodiazepines NONE DETECTED NONE DETECTED   Amphetamines NONE DETECTED NONE DETECTED   Tetrahydrocannabinol NONE DETECTED NONE DETECTED   Barbiturates NONE DETECTED NONE DETECTED    Comment: (NOTE) DRUG SCREEN FOR MEDICAL PURPOSES ONLY.  IF CONFIRMATION IS NEEDED FOR ANY PURPOSE, NOTIFY LAB WITHIN 5 DAYS.  LOWEST DETECTABLE LIMITS FOR URINE DRUG SCREEN Drug Class                     Cutoff (ng/mL) Amphetamine and metabolites    1000 Barbiturate and metabolites    200 Benzodiazepine                 [id number] Tricyclics and metabolites     300 Opiates and metabolites        300 Cocaine and metabolites  300 THC                            50 Performed at [facility], [address], [place], [place] [address]   Urinalysis, Routine w reflex microscopic Urine, Clean Catch     Status: Abnormal   Collection Time: 11/25/20  1:41 PM  Result Value Ref Range   Color, Urine YELLOW YELLOW   APPearance HAZY (A) CLEAR   Specific Gravity, Urine 1.012 1.005 - 1.030   pH 6.0 5.0 - 8.0   Glucose, UA NEGATIVE NEGATIVE mg/dL   Hgb urine dipstick SMALL (A) NEGATIVE   Bilirubin Urine NEGATIVE NEGATIVE   Ketones, ur NEGATIVE NEGATIVE mg/dL   Protein, ur NEGATIVE NEGATIVE mg/dL   Nitrite NEGATIVE NEGATIVE   Leukocytes,Ua LARGE (A) NEGATIVE   RBC / HPF 0-5 0 - 5 RBC/hpf   WBC, UA >50 (H) 0 - 5 WBC/hpf   Bacteria, UA MANY (A) NONE SEEN   Squamous Epithelial / LPF 0-5 0 - 5     Comment: Performed at [facility], [address], [place], [place] [address]  Ethanol     Status: None   Collection Time: 11/25/20  1:56 PM  Result Value Ref Range   Alcohol, Ethyl (B) <10 <10 mg/dL    Comment: (NOTE) Lowest detectable limit for serum alcohol is 10 mg/dL.  For medical purposes only. Performed at [facility], [address], [place], [place] [address]   HIV Antibody (routine testing w rflx)     Status: None   Collection Time: 11/25/20  1:56 PM  Result Value Ref Range   HIV Screen 4th Generation wRfx Non Reactive Non Reactive    Comment: Performed at [facility], [place] [address], [place], [place] [address]  I-stat chem 8, ED     Status: Abnormal   Collection Time: 11/25/20  2:17 PM  Result Value Ref Range   Sodium 136 135 - 145 mmol/L   Potassium 4.0 3.5 - 5.1 mmol/L   Chloride 104 98 - 111 mmol/L   BUN 20 6 - 20 mg/dL   Creatinine, Ser 0.70 0.44 - 1.00 mg/dL   Glucose, Bld 94 70 - 99 mg/dL    Comment: Glucose reference range applies only to samples taken after fasting for at least 8 hours.   Calcium, Ion 1.08 (L) 1.15 - 1.40 mmol/L   TCO2 26 22 - 32 mmol/L   Hemoglobin 13.9 12.0 - 15.0 g/dL   HCT 41.0 36.0 - 46.0 %  Lipid panel     Status: Abnormal   Collection Time: 11/26/20  4:28 AM  Result Value Ref Range   Cholesterol 274 (H) 0 - 200 mg/dL   Triglycerides 107 <150 mg/dL   HDL 79 >40 mg/dL   Total CHOL/HDL Ratio 3.5 RATIO   VLDL 21 0 - 40 mg/dL   LDL Cholesterol 174 (H) 0 - 99 mg/dL    Comment:        Total Cholesterol/HDL:CHD Risk Coronary Heart Disease Risk Table                     Men   Women  1/2 Average Risk   3.4   3.3  Average Risk       5.0   4.4  2 X Average Risk   9.6   7.1  3 X Average Risk  23.4   11.0  Use the calculated Patient Ratio above and the CHD Risk Table to determine the patient's CHD Risk.        ATP III CLASSIFICATION (LDL):  <100     mg/dL   Optimal  100-129  mg/dL   Near or Above                     Optimal  130-159  mg/dL   Borderline  160-189  mg/dL   High  >190     mg/dL   Very High Performed at [facility], [address], [place], [place] [address]   CBC     Status: None   Collection Time: 11/26/20  4:28 AM  Result Value Ref Range   WBC 5.2 4.0 - 10.5 K/uL   RBC 4.34 3.87 - 5.11 MIL/uL   Hemoglobin 13.1 12.0 - 15.0 g/dL   HCT 40.2 36.0 - 46.0 %   MCV 92.6 80.0 - 100.0 fL   MCH 30.2 26.0 - 34.0 pg   MCHC 32.6 30.0 - 36.0 g/dL   RDW 12.8 11.5 - 15.5 %   Platelets 218 150 - 400 K/uL   nRBC 0.0 0.0 - 0.2 %    Comment: Performed at [facility], [address] [place], [place] [address]  Comprehensive metabolic panel     Status: Abnormal   Collection Time: 11/26/20  4:28 AM  Result Value Ref Range   Sodium 137 135 - 145 mmol/L   Potassium 3.6 3.5 - 5.1 mmol/L   Chloride 103 98 - 111 mmol/L   CO2 26 22 - 32 mmol/L   Glucose, Bld 97 70 - 99 mg/dL    Comment: Glucose reference range applies only to samples taken after fasting for at least 8 hours.   BUN 23 (H) 6 - 20 mg/dL   Creatinine, Ser 0.75 0.44 - 1.00 mg/dL   Calcium 9.1 8.9 - 10.3 mg/dL   Total Protein 6.9 6.5 - 8.1 g/dL   Albumin 3.6 3.5 - 5.0 g/dL   AST 26 15 - 41 U/L   ALT 23 0 - 44 U/L   Alkaline Phosphatase 70 38 - 126 U/L   Total Bilirubin 0.5 0.3 - 1.2 mg/dL   GFR, Estimated >60 >60 mL/min    Comment: (NOTE) Calculated using the CKD-EPI Creatinine Equation (2021)    Anion gap 8 5 - 15    Comment: Performed at [facility], [address] [place],  [address]    Studies/Results:  TTE  1. Left ventricular ejection fraction, by estimation, is 60 to 65%. The  left ventricle has normal function. The left ventricle has no regional  wall motion abnormalities. There is mild asymmetric left ventricular  hypertrophy of the basal segment. Left  ventricular diastolic parameters were normal.   2. Right ventricular systolic function is normal. The right ventricular  size is normal. Tricuspid  regurgitation signal is inadequate for assessing  PA pressure.   3. The mitral valve is grossly normal. Trivial mitral valve  regurgitation.   4. The aortic valve is tricuspid. Aortic valve regurgitation is not  visualized. Aortic valve mean gradient measures 2.0 mmHg.   5. The inferior vena cava is normal in size with greater than 50%  respiratory variability, suggesting right atrial pressure of 3 mmHg.      CAROTID DOPPLES IMPRESSION: Bilateral carotid atherosclerosis. Negative for stenosis. Degree of narrowing less than 50% bilaterally by ultrasound criteria.   Patent antegrade vertebral flow bilaterally  BRAIN MRI FINDINGS: MRI HEAD FINDINGS   The examination had to be discontinued prior to completion due to claustrophobia. Within this limitation:   Brain: Acute left thalamic infarct. Associated edema without significant mass effect. No acute hemorrhage, hydrocephalus, mass lesion, or extra-axial fluid collection. No midline shift. Mild scattered T2/FLAIR hyperintensities within the white matter, nonspecific but most likely related to chronic microvascular ischemic disease. Dilated perivascular spaces in bilateral inferior basal ganglia.   Vascular: See below.   Skull and upper cervical spine: Normal marrow signal.   Sinuses/Orbits: Moderate left maxillary sinus mucosal thickening   Other: No mastoid effusions.   MRA HEAD FINDINGS   Anterior circulation: Bilateral intracranial ICAs, MCAs, and ACAs are patent without evidence of a proximal hemodynamically significant stenosis. Mild right greater than left cavernous and paraclinoid ICA narrowing. Evaluation of the distal ACA and MCA branches is limited due to artifact. Possible moderate stenosis of a right M2 MCA branch.   Posterior circulation: Right dominant intradural vertebral artery. Visualized intradural vertebral arteries are patent without significant stenosis. The visualized basilar  artery is patent without significant stenosis. Short segment severe stenosis versus occlusion of the left P2 PCA with flow related signal in the more distal PCA. Moderate stenosis of the right P2 PCA.   IMPRESSION: MRI:   1. Acute left thalamic infarct. Associated edema without significant mass effect. 2. Mild chronic microvascular ischemic disease. 3. Moderate left maxillary sinus mucosal thickening.   MRA:   1. Short segment severe stenosis versus occlusion of the left P2 PCA with flow related signal in the more distal PCA. 2. Moderate stenosis of the right P2 PCA. 3. Possible moderate stenosis of a right M2 MCA branch.    The brain MRI is reviewed in person along with MRA. There is acute infarct involving the left thalamic region. MRA is mostly unrevealing.     Niclas Markell A. Merlene Laughter, M.D.  Diplomate, Tax adviser of Psychiatry and Neurology ( Neurology). 11/26/2020, 5:12 PM

## 2020-11-26 NOTE — Care Management CC44 (Signed)
Condition Code 44 Documentation Completed  Patient Details  Name: Brittany Lester MRN: [id number] Date of Birth: 09-Dec-1960   Condition Code 44 given:  Yes Patient signature on Condition Code 44 notice:  Yes Documentation of 2 MD's agreement:  Yes Code 44 added to claim:  Yes    Erenest Rasher, RN 11/26/2020, 7:38 PM

## 2020-11-26 NOTE — Progress Notes (Signed)
Medicare Outpatient Observation Notice   Patient name:  Brittany Lester Patient number:  [id number]                                                                                                                                                                       You're a hospital outpatient receiving observation services. You are not an inpatient because:    you did not meet Medicare guidelines for Inpatient, you did not have 2 qualifying midnights with qualifying Inpatient diagnosis   Patient status is changed from inpatient to observation (outpatient)status as indicated under the Medicare Condition Code-44 Regulations, Chapter 100-04 of the Medicare Claims Processing Manual 50.3. Date of Status Change 11/26/2020 7:33 PM                                                                                                                                                                        Being an outpatient may affect what you pay in a hospital:   When you're a hospital outpatient, your observation stay is covered under Medicare Part B.   For Part B services, you generally pay:   A copayment for each outpatient hospital service you get. Part B copayments may vary by type of service.   20% of the Medicare-approved amount for most doctor services, after the Part B deductible.   Observation services may affect coverage and payment of your care after you leave the hospital:     If you need skilled nursing facility (SNF) care after you leave the hospital, Medicare Part A will only cover SNF care if you've had a 3-day minimum, medically necessary, inpatient hospital stay for a related illness or injury. An inpatient hospital stay begins the day the hospital admits you as an inpatient based on a doctor's order and doesn't include the day you're discharged.   If you have Medicaid, a Medicare Advantage plan or other health plan, Medicaid or the plan may have different rules for SNF coverage  after  you leave the hospital. Check with Medicaid or your plan.   NOTE: Medicare Part A generally doesn't cover outpatient hospital services, like an observation stay. However, Part A will generally cover medically necessary inpatient services if the hospital admits you as an inpatient based on a doctor's order. In most cases, you'll pay a one-time deductible for all of your inpatient hospital services for the first 60 days you're in a hospital.                                                                                                                                                                      If you have any questions about your observation services, ask the hospital staff member giving you this notice or the doctor providing your hospital care. You can also ask to speak with someone from the hospital's utilization or discharge planning department.   You can also call 1-800-MEDICARE ([phone] 531-2581).  TTY users should call [phone].   Form CMS [id number]   Expiration 05/07/2021 OMB APPROVAL [id number]          Your costs for medications:     Generally, prescription and over-the-counter drugs, including "self-administered drugs," you get in a hospital outpatient setting (like an emergency department) aren't covered by Part B. "Self- administered drugs" are drugs you'd normally take on your own. For safety reasons, many hospitals don't allow you to take medications brought from home. If you have a Medicare prescription drug plan (Part D), your plan may help you pay for these drugs. You'll likely need to pay out-of- pocket for these drugs and submit a claim to your drug plan for a refund. Contact your drug plan for more information.                                                                                                                                                                           If you're enrolled in a Medicare Advantage plan (like an  HMO or PPO) or other  Medicare health plan (Part C), your costs and coverage may be different. Check with your plan to find out about coverage for outpatient observation services.       If you're a Qualified Medicare Beneficiary through your state Medicaid program, you can't be billed for Part A or Part B deductibles, coinsurance, and copayments.                                                                                                                                                                      Additional Information (Optional):   TOC CM spoke to patient and explained Code 44. She verbalized understanding and states he Medicaid usally covers copay cost. Will provide pt with copy for records.                                                                                                                                                                             Please sign below to show you received and understand this notice.                                         Date: 11/26/20 / Time:7:33 PM   CMS does not discriminate in its programs and activities. To request this publication in alternative format, please call: 1-800-MEDICARE or email:[email or web address].   Form CMS [id number]   Expiration 05/07/2021 OMB APPROVAL [id number]        Patient                               No image attached     Trace   Slow   Corrupt  Edit Data   Change Template  On

## 2020-11-26 NOTE — Progress Notes (Signed)
CSW provided pt with substance use resources to her room. CSW spoke with pt about her interest in [facility] PT and OT as this has been recommended. Pt is not interested in [facility] and would prefer Outpatient PT and OT. Pt may need transportation at d/c but possibly her son in law can get her.

## 2020-11-26 NOTE — Care Management Obs Status (Signed)
[place] NOTIFICATION   Patient Details  Name: Brittany Lester MRN: [id number] Date of Birth: July 30, 1960   Medicare Observation Status Notification Given:  Yes    Erenest Rasher, RN 11/26/2020, 7:38 PM

## 2020-11-27 LAB — RPR: RPR Ser Ql: NONREACTIVE

## 2020-11-28 LAB — URINE CULTURE: Culture: >=100000 — AB

## 2020-11-29 LAB — HOMOCYSTEINE: Homocysteine: 12.4 umol/L (ref 0.0–14.5)

## 2020-12-16 ENCOUNTER — Ambulatory Visit (HOSPITAL_COMMUNITY): Payer: Medicare Other | Attending: Internal Medicine | Admitting: Physical Therapy

## 2020-12-16 ENCOUNTER — Encounter (HOSPITAL_COMMUNITY): Payer: Self-pay | Admitting: Physical Therapy

## 2020-12-16 ENCOUNTER — Other Ambulatory Visit: Payer: Self-pay

## 2020-12-16 DIAGNOSIS — R278 Other lack of coordination: Secondary | ICD-10-CM | POA: Insufficient documentation

## 2020-12-16 DIAGNOSIS — M6281 Muscle weakness (generalized): Secondary | ICD-10-CM | POA: Diagnosis present

## 2020-12-16 DIAGNOSIS — R296 Repeated falls: Secondary | ICD-10-CM | POA: Insufficient documentation

## 2020-12-16 DIAGNOSIS — R29898 Other symptoms and signs involving the musculoskeletal system: Secondary | ICD-10-CM | POA: Insufficient documentation

## 2020-12-16 NOTE — Therapy (Addendum)
[place], [place], [address] Phone: [phone]   Fax:  [phone]  Physical Therapy Evaluation  Patient Details  Name: Brittany Lester MRN: [id number] Date of Birth: 1960/06/13 Referring Provider (PT): Jolaine Artist Memon/Fanta   Encounter Date: 12/16/2020   PT End of Session - 12/16/20 1037     Visit Number 1    Number of Visits 12    Date for PT Re-Evaluation 01/27/21    Progress Note Due on Visit 10    PT Start Time 1000    PT Stop Time 1035    PT Time Calculation (min) 35 min    Activity Tolerance Patient tolerated treatment well;Patient limited by fatigue    Behavior During Therapy [facility] for tasks assessed/performed             Past Medical History:  Diagnosis Date   Atrophic vaginitis 07/24/2014   Bipolar disorder ([place])    BV (bacterial vaginosis) 07/24/2014   Hepatitis C    Harvoni   Hepatitis C    Herpes    Hyperlipidemia    Hypertension    LGSIL of cervix of undetermined significance 06/24/2020   2/22 will get Colpo   Lupus ([place])    ???   Medical history non-contributory    Vaginal discharge 07/24/2014    Past Surgical History:  Procedure Laterality Date   c section  12/18/1993   CESAREAN SECTION     COLONOSCOPY N/A 12/24/2013   [id number] mucosa in the terminal ileum/TWO colon polyps removed/ The LEFT colon IS redundant/Small internal hemorrhoids. tubular adenoma. next TCS 12/2018   ESOPHAGOGASTRODUODENOSCOPY N/A 02/06/2014   SLF: 1. No obvious reason for weight loss identified 2. Dyspepsia due to MILD erosive gastritis.   FRACTURE SURGERY Right    20 years ago    There were no vitals filed for this visit.    Subjective Assessment - 12/16/20 1033     Subjective [name] was admitted into the hospital on 7/21 due to having a Lt thalamic infarct. She was discharged on 7/22 and is currently being referred to skilled PT.  At this time [name] states that her Rt side is very heavy, and  stiff.   She is not walking as well as she use to.  She has fallen twice since she has been at home due to balnance issues. .  She now needs help for shopping and housekeeping where she was doing this by herself.  She is having a difficult time going up nas down setps    Pertinent History HTN, substance abuse, recent Rt thalamic infarct    Limitations Reading    How long can you sit comfortably? no problem    How long can you stand comfortably? 5 minutes    How long can you walk comfortably? 5 minutes    Patient Stated Goals To function better,             Rt arm is heavy 7/10   [facility] PT Assessment - 12/16/20 0001       Assessment   Medical Diagnosis Lt thalmic infarct with pain and weakness    Referring Provider (PT) Jolaine Artist Memon    Onset Date/Surgical Date 11/25/20    Next MD Visit unknown    Prior Therapy acute      Precautions   Precautions Fall      Restrictions   Weight Bearing Restrictions Yes      Balance Screen   Has the  patient fallen in the past 6 months Yes    How many times? 2    Has the patient had a decrease in activity level because of a fear of falling?  Yes    Is the patient reluctant to leave their home because of a fear of falling?  No      Prior Function   Level of Independence Independent    Vocation On disability    Leisure none      Cognition   Overall Cognitive Status Within Functional Limits for tasks assessed      Functional Tests   Functional tests Sit to Stand;Single leg stance      Single Leg Stance   Comments Rt: 2; ; LT 39 "      Sit to Stand   Comments 9 in 30 seconds      ROM / Strength   AROM / PROM / Strength Strength      Strength   Strength Assessment Site Knee;Hip;Ankle    Right/Left Hip Right;Left    Right Hip Flexion 4+/5    Right Hip Extension 3/5    Right Hip ABduction 3/5    Left Hip Flexion 5/5    Left Hip Extension 4/5    Left Hip ABduction 5/5    Right/Left Knee Right;Left    Right Knee Flexion 5/5     Right Knee Extension 5/5    Left Knee Flexion 5/5    Left Knee Extension 5/5    Right/Left Ankle Right;Left    Right Ankle Dorsiflexion 3+/5    Left Ankle Dorsiflexion 5/5      Ambulation/Gait   Ambulation Distance (Feet) 202 Feet    Assistive device None    Gait Pattern Decreased arm swing - right;Decreased arm swing - left;Decreased step length - right;Decreased step length - left;Decreased hip/knee flexion - right;Decreased hip/knee flexion - left;Decreased dorsiflexion - right;Decreased dorsiflexion - left                        Objective measurements completed on examination: See above findings.       [facility] Adult PT Treatment/Exercise - 12/16/20 0001       Exercises   Exercises Knee/Hip      Knee/Hip Exercises: Standing   Heel Raises Both;5 reps    SLS x 3 B      Knee/Hip Exercises: Seated   Sit to Sand 5 reps      Knee/Hip Exercises: Supine   Bridges 10 reps      Knee/Hip Exercises: Sidelying   Hip ABduction Strengthening;Right;5 reps                      PT Short Term Goals - 12/16/20 1511       PT SHORT TERM GOAL #1   Title PT to be I in HEP to improve hip and core strength to be able to rise from a low lying couch and be able to complete 12 sit to stand in a 30 second period.    Time 3    Period Weeks    Status New    Target Date 01/06/21      PT SHORT TERM GOAL #2   Title Pt to be able to single leg stance for at least 12 seconds B to reduce risk of falling.    Time 3    Period Weeks  PT Long Term Goals - 12/16/20 1513       PT LONG TERM GOAL #1   Title PT to be completing a HEP to be able to complete 15 sit to stand in a 30 second period for improved functional ability.    Time 6    Period Weeks    Status New    Target Date 01/27/21      PT LONG TERM GOAL #2   Title PT core and LE strength to be increased 1 grade to be able to go up and down 8 steps in a reciprocal manner with one hand hold.     Time 6    Period Weeks    Status New      PT LONG TERM GOAL #3   Title PT to be able to single leg stance for at least 30 seconds B to reduce risk of falling.    Time 6    Period Weeks    Status New      PT LONG TERM GOAL #4   Title PT to show imporved gait mechanics ie longer step length, improved ankle and knee motion to be able to walk 300 ft in a 2 mintue period of time.    Time 6    Period Weeks    Status New                    Plan - 12/16/20 1038     Clinical Impression Statement Ms. Uhlmann is a 60 yo female who had an acute Lt Cva on 11/25/2020.  She has currently been referred to skilled Outpatient therapy to improve her functional level.  Evaluation demonstrates decreased strength, decreased balance, increased UE tone and decreased activity tolerance.  Ms. Gergel will benefit from skilled PT to address these issues and maximize her functioning ability    Personal Factors and Comorbidities Comorbidity 2    Comorbidities HTN, substance abuse    Examination-Activity Limitations Carry;Lift;Locomotion Level;Reach Overhead;Sit;Squat;Stairs;Stand    Examination-Participation Restrictions Cleaning;Community Activity;Laundry;Meal Prep;Shop;Yard Work    Stability/Clinical Decision Making Stable/Uncomplicated    Clinical Decision Making Moderate    Rehab Potential Good    PT Frequency 2x / week    PT Duration 6 weeks    PT Treatment/Interventions Moist Heat;Stair training;Gait training;DME Instruction;Functional mobility training;Therapeutic activities;Therapeutic exercise;Balance training;Neuromuscular re-education;Manual techniques    PT Next Visit Plan begin weight bearing strengthening and balance activity.    PT Home Exercise Plan Evaluation:  sitting ankle dorsi/plantarflexion; bridge, sidelying abduction.    Recommended Other Services OT evaluation has been sent to MD    Consulted and Agree with Plan of Care Patient             Patient will benefit from  skilled therapeutic intervention in order to improve the following deficits and impairments:  Abnormal gait, Decreased activity tolerance, Decreased balance, Decreased mobility, Decreased strength, Difficulty walking, Postural dysfunction, Pain  Visit Diagnosis: Repeated falls  Muscle weakness (generalized)     Problem List Patient Active Problem List   Diagnosis Date Noted   Stroke ([place]) 11/25/2020   Substance abuse ([place]) 11/25/2020   Abnormal Pap smear of cervix 06/24/2020   Encounter for screening fecal occult blood testing 06/14/2020   Encounter for gynecological examination with Papanicolaou smear of cervix 06/14/2020   Routine medical exam 06/14/2020   Smoker 06/14/2020   Postmenopause 06/14/2020   Constipation 08/03/2014   Current smoker 08/03/2014   HCV infection 08/03/2014   Vaginal discharge 07/24/2014  BV (bacterial vaginosis) 07/24/2014   Atrophic vaginitis 07/24/2014   Loss of weight 01/06/2014   Abdominal pain, unspecified site 01/06/2014   PRURITUS 09/28/2008   TRANSAMINASES, SERUM, ELEVATED 04/07/2008   ECZEMA 03/04/2008   Chronic hepatitis C virus infection ([place]) 12/10/2007   Hypercholesterolemia 12/10/2007   BIPOLAR DISORDER UNSPECIFIED 12/10/2007   TOBACCO ABUSE 12/10/2007   Essential hypertension 12/10/2007   ALLERGIC RHINITIS 12/10/2007   GERD 12/10/2007   LUPUS 12/10/2007   ARTHRITIS 12/10/2007   LOW BACK PAIN 12/10/2007   Pain in limb 12/10/2007   Rayetta Humphrey, PT CLT [phone]  12/16/2020, 3:21 PM  [facility] [address] [place], [place], [address] Phone: [phone]   Fax:  [phone]  Name: Brittany Lester MRN: [id number] Date of Birth: 08-Oct-1960

## 2020-12-22 ENCOUNTER — Ambulatory Visit (HOSPITAL_COMMUNITY): Payer: Medicare Other | Admitting: Physical Therapy

## 2020-12-22 ENCOUNTER — Other Ambulatory Visit: Payer: Self-pay

## 2020-12-22 DIAGNOSIS — R296 Repeated falls: Secondary | ICD-10-CM | POA: Diagnosis not present

## 2020-12-22 DIAGNOSIS — M6281 Muscle weakness (generalized): Secondary | ICD-10-CM

## 2020-12-22 NOTE — Therapy (Signed)
[place], [place], [address] Phone: [phone]   Fax:  [phone]  Physical Therapy Treatment  Patient Details  Name: Brittany Lester MRN: [id number] Date of Birth: 06-06-1960 Referring Provider (PT): Fanta   Encounter Date: 12/22/2020   PT End of Session - 12/22/20 1511     Visit Number 2    Number of Visits 12    Date for PT Re-Evaluation 01/27/21    Progress Note Due on Visit 10    PT Start Time 1450    PT Stop Time 1530    PT Time Calculation (min) 40 min    Activity Tolerance Patient tolerated treatment well;Patient limited by fatigue    Behavior During Therapy [facility] for tasks assessed/performed             Past Medical History:  Diagnosis Date   Atrophic vaginitis 07/24/2014   Bipolar disorder ([place])    BV (bacterial vaginosis) 07/24/2014   Hepatitis C    Harvoni   Hepatitis C    Herpes    Hyperlipidemia    Hypertension    LGSIL of cervix of undetermined significance 06/24/2020   2/22 will get Colpo   Lupus ([place])    ???   Medical history non-contributory    Vaginal discharge 07/24/2014    Past Surgical History:  Procedure Laterality Date   c section  12/18/1993   CESAREAN SECTION     COLONOSCOPY N/A 12/24/2013   [id number] mucosa in the terminal ileum/TWO colon polyps removed/ The LEFT colon IS redundant/Small internal hemorrhoids. tubular adenoma. next TCS 12/2018   ESOPHAGOGASTRODUODENOSCOPY N/A 02/06/2014   SLF: 1. No obvious reason for weight loss identified 2. Dyspepsia due to MILD erosive gastritis.   FRACTURE SURGERY Right    20 years ago    There were no vitals filed for this visit.   Subjective Assessment - 12/22/20 1452     Subjective Pt states that she is doing her exercises.    Pertinent History HTN, substance abuse, recent Rt thalamic infarct    Limitations Reading    How long can you sit comfortably? no problem    How long can you stand comfortably? 5 minutes    How  long can you walk comfortably? 5 minutes    Patient Stated Goals To function better,    Currently in Pain? Yes    Pain Score 7     Pain Location Arm    Pain Orientation Right    Pain Descriptors / Indicators Heaviness    Pain Type Chronic pain                               [facility] Adult PT Treatment/Exercise - 12/22/20 0001       Exercises   Exercises Knee/Hip      Knee/Hip Exercises: Aerobic   Nustep x 6'  hills 3 level 3      Knee/Hip Exercises: Standing   Functional Squat 10 reps    SLS x 3 B    Other Standing Knee Exercises side step x 2, tandem stance with Rt and LT in front x 3    Other Standing Knee Exercises wall arch x 10; back to wall with UE flexion x 5;      Knee/Hip Exercises: Seated   Sit to Sand 10 reps      Knee/Hip Exercises: Sidelying   Hip ABduction Right;10 reps  Clams 10      Knee/Hip Exercises: Prone   Other Prone Exercises quadriped unweighting arms, x 10; Single leg raise x 5.                      PT Short Term Goals - 12/22/20 1523       PT SHORT TERM GOAL #1   Title PT to be I in HEP to improve hip and core strength to be able to rise from a low lying couch and be able to complete 12 sit to stand in a 30 second period.    Time 3    Period Weeks    Status On-going    Target Date 01/06/21      PT SHORT TERM GOAL #2   Title Pt to be able to single leg stance for at least 12 seconds B to reduce risk of falling.    Time 3    Period Weeks    Status On-going               PT Long Term Goals - 12/22/20 1536       PT LONG TERM GOAL #1   Title PT to be completing a HEP to be able to complete 15 sit to stand in a 30 second period for improved functional ability.    Time 6    Period Weeks    Status On-going      PT LONG TERM GOAL #2   Title PT core and LE strength to be increased 1 grade to be able to go up and down 8 steps in a reciprocal manner with one hand hold.    Time 6    Period Weeks     Status On-going      PT LONG TERM GOAL #3   Title PT to be able to single leg stance for at least 30 seconds B to reduce risk of falling.    Time 6    Period Weeks    Status On-going      PT LONG TERM GOAL #4   Title PT to show imporved gait mechanics ie longer step length, improved ankle and knee motion to be able to walk 300 ft in a 2 mintue period of time.    Time 6    Period Weeks    Status On-going                   Plan - 12/22/20 1513     Clinical Impression Statement Evaluation and goals reveiwed witn pt.  Began a strengthening and balance program with pt emphasising proper posture.    Personal Factors and Comorbidities Comorbidity 2    Comorbidities HTN, substance abuse    Examination-Activity Limitations Carry;Lift;Locomotion Level;Reach Overhead;Sit;Squat;Stairs;Stand    Examination-Participation Restrictions Cleaning;Community Activity;Laundry;Meal Prep;Shop;Yard Work    Stability/Clinical Decision Making Stable/Uncomplicated    Clinical Decision Making Moderate    Rehab Potential Good    PT Frequency 2x / week    PT Duration 6 weeks    PT Treatment/Interventions Moist Heat;Stair training;Gait training;DME Instruction;Functional mobility training;Therapeutic activities;Therapeutic exercise;Balance training;Neuromuscular re-education;Manual techniques    PT Next Visit Plan continue with  weight bearing strengthening and balance activity.    PT Home Exercise Plan Evaluation:  sitting ankle dorsi/plantarflexion; bridge, sidelying abduction.;8/17: sit to stand , squat side stepping.             Patient will benefit from skilled therapeutic intervention in order to improve the  following deficits and impairments:  Abnormal gait, Decreased activity tolerance, Decreased balance, Decreased mobility, Decreased strength, Difficulty walking, Postural dysfunction, Pain  Visit Diagnosis: Repeated falls  Muscle weakness (generalized)     Problem List Patient  Active Problem List   Diagnosis Date Noted   Stroke ([place]) 11/25/2020   Substance abuse ([place]) 11/25/2020   Abnormal Pap smear of cervix 06/24/2020   Encounter for screening fecal occult blood testing 06/14/2020   Encounter for gynecological examination with Papanicolaou smear of cervix 06/14/2020   Routine medical exam 06/14/2020   Smoker 06/14/2020   Postmenopause 06/14/2020   Constipation 08/03/2014   Current smoker 08/03/2014   HCV infection 08/03/2014   Vaginal discharge 07/24/2014   BV (bacterial vaginosis) 07/24/2014   Atrophic vaginitis 07/24/2014   Loss of weight 01/06/2014   Abdominal pain, unspecified site 01/06/2014   PRURITUS 09/28/2008   TRANSAMINASES, SERUM, ELEVATED 04/07/2008   ECZEMA 03/04/2008   Chronic hepatitis C virus infection ([place]) 12/10/2007   Hypercholesterolemia 12/10/2007   BIPOLAR DISORDER UNSPECIFIED 12/10/2007   TOBACCO ABUSE 12/10/2007   Essential hypertension 12/10/2007   ALLERGIC RHINITIS 12/10/2007   GERD 12/10/2007   LUPUS 12/10/2007   ARTHRITIS 12/10/2007   LOW BACK PAIN 12/10/2007   Pain in limb 12/10/2007    Rayetta Humphrey, PT CLT [phone]  12/22/2020, 3:37 PM  [facility] [address] [place], [place], [address] Phone: [phone]   Fax:  [phone]  Name: Brittany Lester MRN: [id number] Date of Birth: 09/13/1960

## 2020-12-24 ENCOUNTER — Encounter (HOSPITAL_COMMUNITY): Payer: Medicare Other | Admitting: Physical Therapy

## 2020-12-24 ENCOUNTER — Ambulatory Visit (HOSPITAL_COMMUNITY): Payer: Medicare Other | Admitting: Occupational Therapy

## 2020-12-28 ENCOUNTER — Encounter (HOSPITAL_COMMUNITY): Payer: Self-pay

## 2020-12-28 ENCOUNTER — Ambulatory Visit (HOSPITAL_COMMUNITY): Payer: Medicare Other

## 2020-12-28 ENCOUNTER — Other Ambulatory Visit: Payer: Self-pay

## 2020-12-28 DIAGNOSIS — R296 Repeated falls: Secondary | ICD-10-CM

## 2020-12-28 DIAGNOSIS — M6281 Muscle weakness (generalized): Secondary | ICD-10-CM

## 2020-12-28 NOTE — Therapy (Signed)
[place], [place], [address] Phone: [phone]   Fax:  [phone]  Physical Therapy Treatment  Patient Details  Name: Brittany Lester MRN: [id number] Date of Birth: 08/31/1960 Referring Provider (PT): Fanta   Encounter Date: 12/28/2020   PT End of Session - 12/28/20 1259     Visit Number 3    Number of Visits 12    Date for PT Re-Evaluation 01/27/21    Authorization Type UHC Medicare    Progress Note Due on Visit 10    PT Start Time 1128    PT Stop Time 1212    PT Time Calculation (min) 44 min    Activity Tolerance Patient tolerated treatment well;Patient limited by fatigue    Behavior During Therapy [facility] for tasks assessed/performed             Past Medical History:  Diagnosis Date   Atrophic vaginitis 07/24/2014   Bipolar disorder ([place])    BV (bacterial vaginosis) 07/24/2014   Hepatitis C    Harvoni   Hepatitis C    Herpes    Hyperlipidemia    Hypertension    LGSIL of cervix of undetermined significance 06/24/2020   2/22 will get Colpo   Lupus ([place])    ???   Medical history non-contributory    Vaginal discharge 07/24/2014    Past Surgical History:  Procedure Laterality Date   c section  12/18/1993   CESAREAN SECTION     COLONOSCOPY N/A 12/24/2013   [id number] mucosa in the terminal ileum/TWO colon polyps removed/ The LEFT colon IS redundant/Small internal hemorrhoids. tubular adenoma. next TCS 12/2018   ESOPHAGOGASTRODUODENOSCOPY N/A 02/06/2014   SLF: 1. No obvious reason for weight loss identified 2. Dyspepsia due to MILD erosive gastritis.   FRACTURE SURGERY Right    20 years ago    There were no vitals filed for this visit.   Subjective Assessment - 12/28/20 1133     Subjective Feeling good, has been completeing her exercises at home.    Pertinent History HTN, substance abuse, recent Rt thalamic infarct    Patient Stated Goals To function better,    Currently in Pain? Yes    Pain  Score 3     Pain Location Arm   Right side, head, arm and leg   Pain Orientation Right    Pain Descriptors / Indicators Heaviness    Pain Type Chronic pain    Pain Relieving Factors unsure    Effect of Pain on Daily Activities limits                [facility] PT Assessment - 12/28/20 0001       Assessment   Medical Diagnosis Lt thalmic infarct with pain and weakness    Referring Provider (PT) Fanta    Onset Date/Surgical Date 11/25/20    Next MD Visit unknown; 6 months    Prior Therapy acute      Precautions   Precautions [place] [facility] Adult PT Treatment/Exercise - 12/28/20 0001       Exercises   Exercises Knee/Hip      Knee/Hip Exercises: Standing   Heel Raises 3 sets;5 reps    Heel Raises Limitations wall arch with heel raise    Functional Squat 10 reps    SLS Lt 23", Rt 8"  SLS with Vectors 3x 5"    Other Standing Knee Exercises sidestep RTB around thigh 2RT    Other Standing Knee Exercises tandem stance on floor x 1; on foam 2x 30"; RTB shoulder extension and rows      Knee/Hip Exercises: Seated   Sit to Sand 10 reps   eccentric control     Knee/Hip Exercises: Prone   Other Prone Exercises Quadruped UE, LE, UE/LE 10x 5"; firehydrant with RTB                      PT Short Term Goals - 12/22/20 1523       PT SHORT TERM GOAL #1   Title PT to be I in HEP to improve hip and core strength to be able to rise from a low lying couch and be able to complete 12 sit to stand in a 30 second period.    Time 3    Period Weeks    Status On-going    Target Date 01/06/21      PT SHORT TERM GOAL #2   Title Pt to be able to single leg stance for at least 12 seconds B to reduce risk of falling.    Time 3    Period Weeks    Status On-going               PT Long Term Goals - 12/22/20 1536       PT LONG TERM GOAL #1   Title PT to be completing a HEP to be able to complete 15 sit to stand in a 30 second period  for improved functional ability.    Time 6    Period Weeks    Status On-going      PT LONG TERM GOAL #2   Title PT core and LE strength to be increased 1 grade to be able to go up and down 8 steps in a reciprocal manner with one hand hold.    Time 6    Period Weeks    Status On-going      PT LONG TERM GOAL #3   Title PT to be able to single leg stance for at least 30 seconds B to reduce risk of falling.    Time 6    Period Weeks    Status On-going      PT LONG TERM GOAL #4   Title PT to show imporved gait mechanics ie longer step length, improved ankle and knee motion to be able to walk 300 ft in a 2 mintue period of time.    Time 6    Period Weeks    Status On-going                   Plan - 12/28/20 1300     Clinical Impression Statement Pt progressing well towards POC.  Added postural, LE strengthening and balance activities with visual musculature fatigue.  No reports of pain through session.    Personal Factors and Comorbidities Comorbidity 2    Comorbidities HTN, substance abuse    Examination-Activity Limitations Carry;Lift;Locomotion Level;Reach Overhead;Sit;Squat;Stairs;Stand    Examination-Participation Restrictions Cleaning;Community Activity;Laundry;Meal Prep;Shop;Yard Work    Stability/Clinical Decision Making Stable/Uncomplicated    Clinical Decision Making Moderate    Rehab Potential Good    PT Frequency 2x / week    PT Duration 6 weeks    PT Treatment/Interventions Moist Heat;Stair training;Gait training;DME Instruction;Functional mobility training;Therapeutic activities;Therapeutic exercise;Balance training;Neuromuscular re-education;Manual techniques  PT Next Visit Plan continue with  weight bearing strengthening and balance activity.    PT Home Exercise Plan Evaluation:  sitting ankle dorsi/plantarflexion; bridge, sidelying abduction.;8/17: sit to stand , squat side stepping.; 8/23:quadruped UE/LE    Consulted and Agree with Plan of Care Patient              Patient will benefit from skilled therapeutic intervention in order to improve the following deficits and impairments:  Abnormal gait, Decreased activity tolerance, Decreased balance, Decreased mobility, Decreased strength, Difficulty walking, Postural dysfunction, Pain  Visit Diagnosis: Repeated falls  Muscle weakness (generalized)     Problem List Patient Active Problem List   Diagnosis Date Noted   Stroke ([place]) 11/25/2020   Substance abuse ([place]) 11/25/2020   Abnormal Pap smear of cervix 06/24/2020   Encounter for screening fecal occult blood testing 06/14/2020   Encounter for gynecological examination with Papanicolaou smear of cervix 06/14/2020   Routine medical exam 06/14/2020   Smoker 06/14/2020   Postmenopause 06/14/2020   Constipation 08/03/2014   Current smoker 08/03/2014   HCV infection 08/03/2014   Vaginal discharge 07/24/2014   BV (bacterial vaginosis) 07/24/2014   Atrophic vaginitis 07/24/2014   Loss of weight 01/06/2014   Abdominal pain, unspecified site 01/06/2014   PRURITUS 09/28/2008   TRANSAMINASES, SERUM, ELEVATED 04/07/2008   ECZEMA 03/04/2008   Chronic hepatitis C virus infection ([place]) 12/10/2007   Hypercholesterolemia 12/10/2007   BIPOLAR DISORDER UNSPECIFIED 12/10/2007   TOBACCO ABUSE 12/10/2007   Essential hypertension 12/10/2007   ALLERGIC RHINITIS 12/10/2007   GERD 12/10/2007   LUPUS 12/10/2007   ARTHRITIS 12/10/2007   LOW BACK PAIN 12/10/2007   Pain in limb 12/10/2007   Ihor Austin, LPTA/CLT; CBIS [phone]  Aldona Lento 12/28/2020, 1:05 PM  [place], [place], [address] Phone: [phone]   Fax:  [phone]  Name: Brittany Lester MRN: [id number] Date of Birth: December 27, 1960

## 2020-12-30 ENCOUNTER — Telehealth (HOSPITAL_COMMUNITY): Payer: Self-pay

## 2020-12-30 ENCOUNTER — Encounter (HOSPITAL_COMMUNITY): Payer: Medicare Other

## 2020-12-30 ENCOUNTER — Ambulatory Visit (HOSPITAL_COMMUNITY): Payer: Medicare Other | Admitting: Occupational Therapy

## 2020-12-30 NOTE — Telephone Encounter (Signed)
Called pt to cx apptment we did not get the signed order from the MD- Her eval will be on 01/03/21

## 2021-01-03 ENCOUNTER — Encounter (HOSPITAL_COMMUNITY): Payer: Self-pay

## 2021-01-03 ENCOUNTER — Ambulatory Visit (HOSPITAL_COMMUNITY): Payer: Medicare Other

## 2021-01-05 ENCOUNTER — Other Ambulatory Visit: Payer: Self-pay

## 2021-01-05 ENCOUNTER — Ambulatory Visit (HOSPITAL_COMMUNITY): Payer: Medicare Other | Admitting: Physical Therapy

## 2021-01-05 ENCOUNTER — Ambulatory Visit (HOSPITAL_COMMUNITY): Payer: Medicare Other

## 2021-01-05 ENCOUNTER — Encounter (HOSPITAL_COMMUNITY): Payer: Self-pay

## 2021-01-05 DIAGNOSIS — R296 Repeated falls: Secondary | ICD-10-CM | POA: Diagnosis not present

## 2021-01-05 DIAGNOSIS — R29898 Other symptoms and signs involving the musculoskeletal system: Secondary | ICD-10-CM

## 2021-01-05 DIAGNOSIS — R278 Other lack of coordination: Secondary | ICD-10-CM

## 2021-01-05 NOTE — Therapy (Signed)
[place], [place], [address] Phone: [phone]   Fax:  [phone]  Occupational Therapy Evaluation  Patient Details  Name: Brittany Lester MRN: [id number] Date of Birth: 1960/11/14 Referring Provider (OT): Rosita Fire, MD   Encounter Date: 01/05/2021   OT End of Session - 01/05/21 1128     Visit Number 1    Number of Visits 8    Date for OT Re-Evaluation 02/02/21    Authorization Type 1) UHC medicare 2) [place] medicaid    Authorization Time Period no copay. no authorization needed.    Progress Note Due on Visit 10    OT Start Time 0945    OT Stop Time 1028    OT Time Calculation (min) 43 min    Activity Tolerance Patient tolerated treatment well    Behavior During Therapy WFL for tasks assessed/performed             Past Medical History:  Diagnosis Date   Atrophic vaginitis 07/24/2014   Bipolar disorder ([place])    BV (bacterial vaginosis) 07/24/2014   Hepatitis C    Harvoni   Hepatitis C    Herpes    Hyperlipidemia    Hypertension    LGSIL of cervix of undetermined significance 06/24/2020   2/22 will get Colpo   Lupus ([place])    ???   Medical history non-contributory    Vaginal discharge 07/24/2014    Past Surgical History:  Procedure Laterality Date   c section  12/18/1993   CESAREAN SECTION     COLONOSCOPY N/A 12/24/2013   [id number] mucosa in the terminal ileum/TWO colon polyps removed/ The LEFT colon IS redundant/Small internal hemorrhoids. tubular adenoma. next TCS 12/2018   ESOPHAGOGASTRODUODENOSCOPY N/A 02/06/2014   SLF: 1. No obvious reason for weight loss identified 2. Dyspepsia due to MILD erosive gastritis.   FRACTURE SURGERY Right    20 years ago    There were no vitals filed for this visit.   Subjective Assessment - 01/05/21 0952     Subjective  S: It feels heavy all the time and that is painful.    Pertinent History Patient is a 60 y/o female S/P left thalamic CVA which occurred  on 11/25/20 causing right side hemiparesis. Dr. Legrand Rams has referred patient to occupational therapy for evaluation and treatment.    Patient Stated Goals To increase the use of her right UE.    Currently in Pain? Yes    Pain Score 8     Pain Location Arm    Pain Orientation Right    Pain Descriptors / Indicators Heaviness;Constant    Pain Type Acute pain    Pain Onset More than a month ago    Pain Frequency Constant    Aggravating Factors  Uncomfortable to sleep.    Pain Relieving Factors Tylenol does not work.    Effect of Pain on Daily Activities severe effect    Multiple Pain Sites No               [facility] OT Assessment - 01/05/21 0954       Assessment   Medical Diagnosis Right side weakness    Referring Provider (OT) Rosita Fire, MD    Onset Date/Surgical Date 11/25/20    Hand Dominance Right    Next MD Visit --   Dec. 2022   Prior Therapy OT and PT eval in acute      Precautions   Precautions Fall;Other (comment)  Precaution Comments right side hemiparesis      Restrictions   Weight Bearing Restrictions No      Balance Screen   Has the patient fallen in the past 6 months Yes    How many times? 2    Has the patient had a decrease in activity level because of a fear of falling?  Yes      Home  Environment   Family/patient expects to be discharged to: Private residence    Living Arrangements Non-relatives/Friends    Available Help at Discharge Available PRN/intermittently    Type of Home Aartment      Prior Function   Level of Independence Independent    Vocation On disability      ADL   ADL comments Difficulty completing any tasks using her RUE.      Mobility   Mobility Status History of falls      Written Expression   Dominant Hand Right      Vision - History   Baseline Vision No visual deficits      Cognition   Overall Cognitive Status Within Functional Limits for tasks assessed      Observation/Other Assessments   Focus on Therapeutic  Outcomes (FOTO)  N/A      Posture/Postural Control   Posture/Postural Control Postural limitations    Postural Limitations Rounded Shoulders;Forward head      Sensation   Light Touch Appears Intact    Stereognosis Appears Intact    Hot/Cold Appears Intact    Proprioception Appears Intact      Coordination   Gross Motor Movements are Fluid and Coordinated Yes    Fine Motor Movements are Fluid and Coordinated No    9 Hole Peg Test Left;Right    Right 9 Hole Peg Test 31.3"    Left 9 Hole Peg Test 25.1"      ROM / Strength   AROM / PROM / Strength AROM;PROM;Strength      AROM   Overall AROM Comments Assessed seated. IR/er adducted    AROM Assessment Site Shoulder;Elbow;Forearm;Wrist;Finger    Right/Left Shoulder Right    Right Shoulder Flexion 106 Degrees    Right Shoulder ABduction 85 Degrees    Right Shoulder Internal Rotation 90 Degrees    Right Shoulder External Rotation 10 Degrees    Right/Left Elbow Right    Right Elbow Flexion 155    Right Elbow Extension -29    Right/Left Forearm Right    Right Forearm Pronation 90 Degrees    Right Forearm Supination 90 Degrees    Right/Left Wrist Right    Right Wrist Extension 70 Degrees    Right Wrist Flexion 80 Degrees    Right/Left Finger Right    Right Composite Finger Extension --   100%   Right Composite Finger Flexion --   100%     PROM   Overall PROM Comments Assessed seated.    PROM Assessment Site Elbow    Right/Left Elbow Right    Right Elbow Extension 0      Strength   Overall Strength Comments shoulder, wrist, and elbow strength assessed during functional task and appear WFL.    Strength Assessment Site Hand;Shoulder;Elbow;Forearm;Wrist    Right/Left hand Right;Left    Right Hand Grip (lbs) 35    Right Hand Lateral Pinch 8 lbs    Right Hand 3 Point Pinch 6 lbs    Left Hand Grip (lbs) 55    Left Hand Lateral Pinch 8 lbs  Left Hand 3 Point Pinch 8 lbs                             OT  Education - 01/05/21 1127     Education Details yellow putty, coordination tasks, fine motor coordination activities. Verbal instruction provided to refrain from keeping arm in a flexed and protected position. Use RUE for all daily tasks as much as possible.    Person(s) Educated Patient    Methods Explanation;Demonstration;Handout    Comprehension Verbalized understanding              OT Short Term Goals - 01/05/21 1132       OT SHORT TERM GOAL #1   Title Patient will return to using her right UE as her dominant extremity for 75% or more of her daily tasks.    Time 4    Period Weeks    Status New    Target Date 02/02/21      OT SHORT TERM GOAL #2   Title Patient will increase her right hand grip strength to 45# and her pinch strength to 11-12# in order to open tight or new jars with less difficulty.    Time 4    Period Weeks    Status New      OT SHORT TERM GOAL #3   Title Patient will icrease her right hand coordination by completing the 9 hole peg test in 25" or less while demonstrating greater motor control and fluidity when completing tasks such as meal prep and dressing.    Time 4    Period Weeks    Status New      OT SHORT TERM GOAL #4   Title Patient will demonstrate a functional resting arm position with her RUE 100% of the time while showing improved body awareness and decreased heaviness.    Time 4    Period Weeks    Status New                      Plan - 01/05/21 1130     Clinical Impression Statement A: Patient is a 60 y/o female S/P left CVA causing decreased RUE strength, decreased coordination and functional use resulting in difficulty completing daily tasks using her right UE as her dominant extremity.    OT Occupational Profile and History Problem Focused Assessment - Including review of records relating to presenting problem    Occupational performance deficits (Please refer to evaluation for details): ADL's;IADL's;Rest and Sleep    Body  Structure / Function / Physical Skills ADL;UE functional use;Pain;FMC;ROM;Coordination;IADL;Strength    Rehab Potential Excellent    Clinical Decision Making Limited treatment options, no task modification necessary    Comorbidities Affecting Occupational Performance: May have comorbidities impacting occupational performance    Modification or Assistance to Complete Evaluation  No modification of tasks or assist necessary to complete eval    OT Frequency 2x / week    OT Duration 4 weeks    OT Treatment/Interventions Self-care/ADL training;Patient/family education;DME and/or AE instruction;Passive range of motion;Cryotherapy;Electrical Stimulation;Moist Heat;Neuromuscular education;Therapeutic activities;Manual Therapy;Therapeutic exercise;Splinting    Plan P: Patient will benefit from skilled OT services to increase functional use of her right arm while using it as her dominant extremity. Treatment plan: NM re-ed, hand strength (grip and pinch), motor coordination with speed focus, ADL re-training.    OT Home Exercise Plan eval: yellow putty, coordination tasks    Consulted and  Agree with Plan of Care Patient             Patient will benefit from skilled therapeutic intervention in order to improve the following deficits and impairments:   Body Structure / Function / Physical Skills: ADL, UE functional use, Pain, FMC, ROM, Coordination, IADL, Strength       Visit Diagnosis: Other lack of coordination - Plan: Ot plan of care cert/re-cert  Other symptoms and signs involving the musculoskeletal system - Plan: Ot plan of care cert/re-cert    Problem List Patient Active Problem List   Diagnosis Date Noted   Stroke [facility]) 11/25/2020   Substance abuse ([place]) 11/25/2020   Abnormal Pap smear of cervix 06/24/2020   Encounter for screening fecal occult blood testing 06/14/2020   Encounter for gynecological examination with Papanicolaou smear of cervix 06/14/2020   Routine medical exam  06/14/2020   Smoker 06/14/2020   Postmenopause 06/14/2020   Constipation 08/03/2014   Current smoker 08/03/2014   HCV infection 08/03/2014   Vaginal discharge 07/24/2014   BV (bacterial vaginosis) 07/24/2014   Atrophic vaginitis 07/24/2014   Loss of weight 01/06/2014   Abdominal pain, unspecified site 01/06/2014   PRURITUS 09/28/2008   TRANSAMINASES, SERUM, ELEVATED 04/07/2008   ECZEMA 03/04/2008   Chronic hepatitis C virus infection ([place]) 12/10/2007   Hypercholesterolemia 12/10/2007   BIPOLAR DISORDER UNSPECIFIED 12/10/2007   TOBACCO ABUSE 12/10/2007   Essential hypertension 12/10/2007   ALLERGIC RHINITIS 12/10/2007   GERD 12/10/2007   LUPUS 12/10/2007   ARTHRITIS 12/10/2007   LOW BACK PAIN 12/10/2007   Pain in limb 12/10/2007    Ailene Ravel, OTR/L,CBIS  [phone]  01/05/2021, 11:40 AM  [place] [address] [place], [place], [address] Phone: [phone]   Fax:  [phone]  Name: Rabeka Boundy MRN: [id number] Date of Birth: 1961/01/23

## 2021-01-05 NOTE — Patient Instructions (Signed)
Theraputty Home Exercise Program  Complete 1-2 times a day.  putty squeeze  Pt. should squeeze putty in hand trying to keep it round by rotating putty after each squeeze. push fingers through putty to palm each time. Complete for ____3-5__ minutes.   PUTTY KEY GRIP Hold the putty at the top of your hand. Squeeze the putty between your thumb and the side of your 2nd finger as shown. Complete for _____3-5___ minutes.    PUTTY 3 JAW CHUCK  Roll up some putty into a ball then flatten it. Then, firmly squeeze it with your first 3 fingers as shown. Complete for ___3-5___ minutes.    Coordination Activities  Perform the following activities for 15-30 minutes 1-2 times per day with right hand(s).  Flip cards 1 at a time as fast as you can. Deal cards with your thumb (Hold deck in hand and push card off top with thumb). Rotate card in hand (clockwise and counter-clockwise). Shuffle cards. Pick up coins, buttons, marbles, dried beans/pasta of different sizes and place in container. Pick up coins and place in container or coin bank. Pick up coins and stack. Practice writing and/or typing.   Motor coordination exercises:   1)  With your right/left arm straight out in front of you, keep your elbow straight.  Write your name in the air 3-4 times quickly.  2) Put both of your hands on your lap with your palms down, turn your palms up and down as fast as you can. 10 times.  3) Put both of your hands on your lap keeping your wrists on your lap at all times.  Now pat on your lap quickly alternating right/left. 10 times.  4) Put your hands on your lap and drum your fingers.  Make sure your fingers are moving individually as fast as you can for 1 minute.

## 2021-01-11 ENCOUNTER — Ambulatory Visit (HOSPITAL_COMMUNITY): Payer: Medicare Other | Attending: Internal Medicine | Admitting: Physical Therapy

## 2021-01-11 ENCOUNTER — Other Ambulatory Visit: Payer: Self-pay

## 2021-01-11 ENCOUNTER — Encounter (HOSPITAL_COMMUNITY): Payer: Medicare Other | Admitting: Occupational Therapy

## 2021-01-11 DIAGNOSIS — M6281 Muscle weakness (generalized): Secondary | ICD-10-CM | POA: Diagnosis present

## 2021-01-11 DIAGNOSIS — R296 Repeated falls: Secondary | ICD-10-CM | POA: Insufficient documentation

## 2021-01-11 DIAGNOSIS — R29898 Other symptoms and signs involving the musculoskeletal system: Secondary | ICD-10-CM | POA: Insufficient documentation

## 2021-01-11 DIAGNOSIS — R278 Other lack of coordination: Secondary | ICD-10-CM | POA: Insufficient documentation

## 2021-01-11 NOTE — Therapy (Signed)
[facility] [address] [place], [place], [address] Phone: [phone]   Fax:  [phone]  Physical Therapy Treatment  Patient Details  Name: Brittany Lester MRN: [id number] Date of Birth: [birth year] Referring Provider (PT): Fanta  PHYSICAL THERAPY DISCHARGE SUMMARY  Visits from Start of Care: 4  Current functional level related to goals / functional outcomes: I in all aspects   Remaining deficits: Decreased gluteal maximus strength B   Education / Equipment: HEP   Patient agrees to discharge. Patient goals were met. Patient is being discharged due to meeting the stated rehab goals.  Encounter Date: 01/11/2021   PT End of Session - 01/11/21 1056     Visit Number 4    Number of Visits 4    Date for PT Re-Evaluation 01/27/21    Authorization Type UHC Medicare    Progress Note Due on Visit 4    PT Start Time 1020    PT Stop Time 1100    PT Time Calculation (min) 40 min    Activity Tolerance Patient tolerated treatment well;Patient limited by fatigue    Behavior During Therapy [facility] for tasks assessed/performed             Past Medical History:  Diagnosis Date   Atrophic vaginitis 07/24/2014   Bipolar disorder ([place])    BV (bacterial vaginosis) 07/24/2014   Hepatitis C    Harvoni   Hepatitis C    Herpes    Hyperlipidemia    Hypertension    LGSIL of cervix of undetermined significance 06/24/2020   2/22 will get Colpo   Lupus ([place])    ???   Medical history non-contributory    Vaginal discharge 07/24/2014    Past Surgical History:  Procedure Laterality Date   c section  12/18/1993   CESAREAN SECTION     COLONOSCOPY N/A 12/24/2013   [id number] mucosa in the terminal ileum/TWO colon polyps removed/ The LEFT colon IS redundant/Small internal hemorrhoids. tubular adenoma. next TCS 12/2018   ESOPHAGOGASTRODUODENOSCOPY N/A 02/06/2014   SLF: 1. No obvious reason for weight loss identified 2. Dyspepsia due to MILD erosive  gastritis.   FRACTURE SURGERY Right    20 years ago    There were no vitals filed for this visit.   Subjective Assessment - 01/11/21 1021     Subjective Pt states that she is doing her exercises she is walking up steps on her own now.  Her hardest task is coming from sit to stand.    Pertinent History HTN, substance abuse, recent Rt thalamic infarct    Limitations Reading    How long can you sit comfortably? no problem    How long can you stand comfortably? 5 minutes    How long can you walk comfortably? 30    Patient Stated Goals To function better,    Currently in Pain? No/denies                [facility] PT Assessment - 01/11/21 0001       Assessment   Medical Diagnosis Lt thalmic infarct with pain and weakness    Referring Provider (PT) Fanta    Onset Date/Surgical Date 11/25/20    Next MD Visit unknown    Prior Therapy acute      Precautions   Precautions Fall      Restrictions   Weight Bearing Restrictions Yes      Prior Function   Level of Independence Independent    Vocation On  disability    Leisure none      Cognition   Overall Cognitive Status Within Functional Limits for tasks assessed      Functional Tests   Functional tests Sit to Stand;Single leg stance      Single Leg Stance   Comments Rt:37" was 3"  2; ; LT 30" was 39 "      Sit to Stand   Comments 17 was 9 in 30 seconds      Strength   Right Hip Flexion 5/5   was 4+   Right Hip Extension 4/5   was 3/5   Right Hip ABduction 5/5   was 3/5   Left Hip Flexion 5/5    Left Hip Extension 4/5    Left Hip ABduction 5/5    Right Knee Flexion 5/5    Right Knee Extension 5/5    Left Knee Flexion 5/5    Left Knee Extension 5/5    Right Ankle Dorsiflexion 4+/5   was 3+   Left Ankle Dorsiflexion 5/5      Ambulation/Gait   Assistive device None    Gait Pattern Significant improved gait pattern compared with initial evaluation.                           [facility] PT  Treatment/Exercise - 01/11/21 0001       Ambulation/Gait   Ambulation Distance (Feet) 380 Feet  in a 2 minute period of time .     Knee/Hip Exercises: Standing   Forward Step Up Both;10 reps;Step Height: 6"    Functional Squat 10 reps    SLS with Vectors 3x 5"      Knee/Hip Exercises: Seated   Sit to Sand 20 reps      Knee/Hip Exercises: Prone   Other Prone Exercises Quadruped UE, LE, UE/LE 10x 5";                    PT Education - 01/11/21 1056     Education Details advanced HEP    Person(s) Educated Patient    Methods Explanation    Comprehension Verbalized understanding              PT Short Term Goals - 01/11/21 1026       PT SHORT TERM GOAL #1   Title PT to be I in HEP to improve hip and core strength to be able to rise from a low lying couch and be able to complete 12 sit to stand in a 30 second period.    Time 3    Period Weeks    Status Achieved    Target Date 01/06/21      PT SHORT TERM GOAL #2   Title Pt to be able to single leg stance for at least 12 seconds B to reduce risk of falling.    Time 3    Period Weeks    Status Achieved               PT Long Term Goals - 01/11/21 1036       PT LONG TERM GOAL #1   Title PT to be completing a HEP to be able to complete 15 sit to stand in a 30 second period for improved functional ability.    Time 6    Period Weeks    Status Achieved      PT LONG TERM GOAL #2   Title PT  core and LE strength to be increased 1 grade to be able to go up and down 8 steps in a reciprocal manner with one hand hold.    Time 6    Period Weeks    Status Achieved      PT LONG TERM GOAL #3   Title PT to be able to single leg stance for at least 30 seconds B to reduce risk of falling.    Time 6    Period Weeks    Status Achieved      PT LONG TERM GOAL #4   Title PT to show imporved gait mechanics ie longer step length, improved ankle and knee motion to be able to walk 300 ft in a 2 mintue period of time.     Time 6    Period Weeks    Status Achieved                   Plan - 01/11/21 1041     Clinical Impression Statement Pt goals reviewed; all goals have been met and pt feels that she is able to work on balance and gluteal strength at home at this point.    Personal Factors and Comorbidities Comorbidity 2    Comorbidities HTN, substance abuse    Examination-Activity Limitations Carry;Lift;Locomotion Level;Reach Overhead;Sit;Squat;Stairs;Stand    Examination-Participation Restrictions Cleaning;Community Activity;Laundry;Meal Prep;Shop;Yard Work    Stability/Clinical Decision Making Stable/Uncomplicated    Rehab Potential Good    PT Frequency 2x / week    PT Duration 6 weeks    PT Treatment/Interventions Moist Heat;Stair training;Gait training;DME Instruction;Functional mobility training;Therapeutic activities;Therapeutic exercise;Balance training;Neuromuscular re-education;Manual techniques    PT Next Visit Plan Discharge.    PT Home Exercise Plan Evaluation:  sitting ankle dorsi/plantarflexion; bridge, sidelying abduction.;8/17: sit to stand , squat side stepping.; 8/23:quadruped UE/LE    Consulted and Agree with Plan of Care Patient             Patient will benefit from skilled therapeutic intervention in order to improve the following deficits and impairments:  Abnormal gait, Decreased activity tolerance, Decreased balance, Decreased mobility, Decreased strength, Difficulty walking, Postural dysfunction, Pain  Visit Diagnosis: Repeated falls  Muscle weakness (generalized)     Problem List Patient Active Problem List   Diagnosis Date Noted   Stroke ([place]) 11/25/2020   Substance abuse ([place]) 11/25/2020   Abnormal Pap smear of cervix 06/24/2020   Encounter for screening fecal occult blood testing 06/14/2020   Encounter for gynecological examination with Papanicolaou smear of cervix 06/14/2020   Routine medical exam 06/14/2020   Smoker 06/14/2020   Postmenopause  06/14/2020   Constipation 08/03/2014   Current smoker 08/03/2014   HCV infection 08/03/2014   Vaginal discharge 07/24/2014   BV (bacterial vaginosis) 07/24/2014   Atrophic vaginitis 07/24/2014   Loss of weight 01/06/2014   Abdominal pain, unspecified site 01/06/2014   PRURITUS 09/28/2008   TRANSAMINASES, SERUM, ELEVATED 04/07/2008   ECZEMA 03/04/2008   Chronic hepatitis C virus infection ([place]) 12/10/2007   Hypercholesterolemia 12/10/2007   BIPOLAR DISORDER UNSPECIFIED 12/10/2007   TOBACCO ABUSE 12/10/2007   Essential hypertension 12/10/2007   ALLERGIC RHINITIS 12/10/2007   GERD 12/10/2007   LUPUS 12/10/2007   ARTHRITIS 12/10/2007   LOW BACK PAIN 12/10/2007   Pain in limb 12/10/2007   Rayetta Humphrey, PT CLT [id number]  01/11/2021, 11:06 AM  [place], [place], [address] Phone: [phone]   Fax:  [phone]  Name: Brittany Lester MRN: [id number] Date of Birth: [birth year]

## 2021-01-13 ENCOUNTER — Ambulatory Visit (HOSPITAL_COMMUNITY): Payer: Medicare Other | Admitting: Occupational Therapy

## 2021-01-13 ENCOUNTER — Encounter (HOSPITAL_COMMUNITY): Payer: Medicare Other | Admitting: Physical Therapy

## 2021-01-13 ENCOUNTER — Encounter (HOSPITAL_COMMUNITY): Payer: Self-pay | Admitting: Occupational Therapy

## 2021-01-13 ENCOUNTER — Other Ambulatory Visit: Payer: Self-pay

## 2021-01-13 DIAGNOSIS — R29898 Other symptoms and signs involving the musculoskeletal system: Secondary | ICD-10-CM

## 2021-01-13 DIAGNOSIS — R296 Repeated falls: Secondary | ICD-10-CM | POA: Diagnosis not present

## 2021-01-13 DIAGNOSIS — R278 Other lack of coordination: Secondary | ICD-10-CM

## 2021-01-13 NOTE — Therapy (Signed)
[place], [place], [address] Phone: [phone]   Fax:  [phone]  Occupational Therapy Treatment  Patient Details  Name: Brittany Lester MRN: [id number] Date of Birth: Oct 21, 1960 Referring Provider (OT): Rosita Fire, MD   Encounter Date: 01/13/2021   OT End of Session - 01/13/21 0853     Visit Number 2    Number of Visits 8    Date for OT Re-Evaluation 02/02/21    Authorization Type 1) UHC medicare 2) [place] medicaid    Authorization Time Period no copay. no authorization needed.    Progress Note Due on Visit 10    OT Start Time [phone]    OT Stop Time 0854    OT Time Calculation (min) 40 min    Activity Tolerance Patient tolerated treatment well    Behavior During Therapy [facility] for tasks assessed/performed             Past Medical History:  Diagnosis Date   Atrophic vaginitis 07/24/2014   Bipolar disorder ([place])    BV (bacterial vaginosis) 07/24/2014   Hepatitis C    Harvoni   Hepatitis C    Herpes    Hyperlipidemia    Hypertension    LGSIL of cervix of undetermined significance 06/24/2020   2/22 will get Colpo   Lupus ([place])    ???   Medical history non-contributory    Vaginal discharge 07/24/2014    Past Surgical History:  Procedure Laterality Date   c section  12/18/1993   CESAREAN SECTION     COLONOSCOPY N/A 12/24/2013   [id number] mucosa in the terminal ileum/TWO colon polyps removed/ The LEFT colon IS redundant/Small internal hemorrhoids. tubular adenoma. next TCS 12/2018   ESOPHAGOGASTRODUODENOSCOPY N/A 02/06/2014   SLF: 1. No obvious reason for weight loss identified 2. Dyspepsia due to MILD erosive gastritis.   FRACTURE SURGERY Right    20 years ago    There were no vitals filed for this visit.   Subjective Assessment - 01/13/21 0809     Subjective  S: I've been trying to use it more.    Currently in Pain? No/denies                [facility] OT Assessment - 01/13/21 0809        Assessment   Medical Diagnosis Lt thalmic infarct with pain and weakness      Precautions   Precautions Fall    Precaution Comments right side hemiparesis                      OT Treatments/Exercises (OP) - 01/13/21 0816       Exercises   Exercises Hand;Theraputty      Hand Exercises   Hand Gripper with Large Beads all beads gripper at 29#, vertical    Hand Gripper with Medium Beads all beads gripper at 29#, horizontal    Hand Gripper with Small Beads all beads gripper at 25#, horizontal      Theraputty   Theraputty - Flatten red-standing    Theraputty - Roll red    Theraputty - Grip red    Theraputty - Pinch red- lateral and 3 point      Neurological Re-education Exercises   Shoulder Flexion AROM;10 reps    Shoulder Protraction AROM;10 reps    Shoulder Horizontal ABduction AROM;10 reps      Fine Motor Coordination (Hand/Wrist)   Fine Motor Coordination Small Pegboard    Small  Pegboard Pt holding pegs in palm and working on translating to fingertips to place into pegboard. Increased time required to complete, once in fingertips pt putting down and then picking back up versus turning to orient while continuing to hold peg. OT providing verbal cuing to attempt to place without sitting the peg down first.                      OT Short Term Goals - 01/13/21 0810       OT SHORT TERM GOAL #1   Title Patient will return to using her right UE as her dominant extremity for 75% or more of her daily tasks.    Time 4    Period Weeks    Status On-going    Target Date 02/02/21      OT SHORT TERM GOAL #2   Title Patient will increase her right hand grip strength to 45# and her pinch strength to 11-12# in order to open tight or new jars with less difficulty.    Time 4    Period Weeks    Status On-going      OT SHORT TERM GOAL #3   Title Patient will icrease her right hand coordination by completing the 9 hole peg test in 25" or less while demonstrating  greater motor control and fluidity when completing tasks such as meal prep and dressing.    Time 4    Period Weeks    Status On-going      OT SHORT TERM GOAL #4   Title Patient will demonstrate a functional resting arm position with her RUE 100% of the time while showing improved body awareness and decreased heaviness.    Time 4    Period Weeks    Status On-going                      Plan - 01/13/21 [id number]     Clinical Impression Statement A: Pt reports she is trying to use her arm more and let it hang naturally by her side when she is moving around. Initiated grip strengthening and fine motor coordination tasks this session. Rest breaks provided as needed, increased time for tedious fine motor task. Pt completing red theraputty tasks for grip and pinch today. Verbal cuing for form and technique.    Body Structure / Function / Physical Skills ADL;UE functional use;Pain;FMC;ROM;Coordination;IADL;Strength    Plan P: Continue with grip strengthening and fine motor coordination tasks, add pinch task    OT Home Exercise Plan eval: yellow putty, coordination tasks    Consulted and Agree with Plan of Care Patient             Patient will benefit from skilled therapeutic intervention in order to improve the following deficits and impairments:   Body Structure / Function / Physical Skills: ADL, UE functional use, Pain, FMC, ROM, Coordination, IADL, Strength       Visit Diagnosis: Other lack of coordination  Other symptoms and signs involving the musculoskeletal system    Problem List Patient Active Problem List   Diagnosis Date Noted   Stroke ([place]) 11/25/2020   Substance abuse ([place]) 11/25/2020   Abnormal Pap smear of cervix 06/24/2020   Encounter for screening fecal occult blood testing 06/14/2020   Encounter for gynecological examination with Papanicolaou smear of cervix 06/14/2020   Routine medical exam 06/14/2020   Smoker 06/14/2020   Postmenopause 06/14/2020    Constipation 08/03/2014   Current smoker  08/03/2014   HCV infection 08/03/2014   Vaginal discharge 07/24/2014   BV (bacterial vaginosis) 07/24/2014   Atrophic vaginitis 07/24/2014   Loss of weight 01/06/2014   Abdominal pain, unspecified site 01/06/2014   PRURITUS 09/28/2008   TRANSAMINASES, SERUM, ELEVATED 04/07/2008   ECZEMA 03/04/2008   Chronic hepatitis C virus infection ([place]) 12/10/2007   Hypercholesterolemia 12/10/2007   BIPOLAR DISORDER UNSPECIFIED 12/10/2007   TOBACCO ABUSE 12/10/2007   Essential hypertension 12/10/2007   ALLERGIC RHINITIS 12/10/2007   GERD 12/10/2007   LUPUS 12/10/2007   ARTHRITIS 12/10/2007   LOW BACK PAIN 12/10/2007   Pain in limb 12/10/2007    Guadelupe Sabin, OTR/L  [phone] 01/13/2021, 8:55 AM  [place] [address] [place], [place], [address] Phone: [phone]   Fax:  [phone]  Name: Chantal Hatch MRN: [id number] Date of Birth: 22-Feb-1961

## 2021-01-17 ENCOUNTER — Encounter (HOSPITAL_COMMUNITY): Payer: Medicare Other | Admitting: Physical Therapy

## 2021-01-18 ENCOUNTER — Encounter (HOSPITAL_COMMUNITY): Payer: Medicare Other | Admitting: Occupational Therapy

## 2021-01-20 ENCOUNTER — Other Ambulatory Visit: Payer: Self-pay

## 2021-01-20 ENCOUNTER — Ambulatory Visit (HOSPITAL_COMMUNITY): Payer: Medicare Other

## 2021-01-20 ENCOUNTER — Encounter (HOSPITAL_COMMUNITY): Payer: Self-pay

## 2021-01-20 DIAGNOSIS — R296 Repeated falls: Secondary | ICD-10-CM | POA: Diagnosis not present

## 2021-01-20 DIAGNOSIS — R29898 Other symptoms and signs involving the musculoskeletal system: Secondary | ICD-10-CM

## 2021-01-20 DIAGNOSIS — R278 Other lack of coordination: Secondary | ICD-10-CM

## 2021-01-20 NOTE — Therapy (Signed)
[place], [place], [address] Phone: [phone]   Fax:  [phone]  Occupational Therapy Treatment  Patient Details  Name: Brittany Lester MRN: [id number] Date of Birth: July 17, 1960 Referring Provider (OT): Rosita Fire, MD   Encounter Date: 01/20/2021   OT End of Session - 01/20/21 1124     Visit Number 3    Number of Visits 8    Date for OT Re-Evaluation 02/02/21    Authorization Type 1) UHC medicare 2) [place] medicaid    Authorization Time Period no copay. no authorization needed.    Progress Note Due on Visit 10    OT Start Time 1034    OT Stop Time 1112    OT Time Calculation (min) 38 min    Activity Tolerance Patient tolerated treatment well    Behavior During Therapy [facility] for tasks assessed/performed             Past Medical History:  Diagnosis Date   Atrophic vaginitis 07/24/2014   Bipolar disorder ([place])    BV (bacterial vaginosis) 07/24/2014   Hepatitis C    Harvoni   Hepatitis C    Herpes    Hyperlipidemia    Hypertension    LGSIL of cervix of undetermined significance 06/24/2020   2/22 will get Colpo   Lupus ([place])    ???   Medical history non-contributory    Vaginal discharge 07/24/2014    Past Surgical History:  Procedure Laterality Date   c section  12/18/1993   CESAREAN SECTION     COLONOSCOPY N/A 12/24/2013   [id number] mucosa in the terminal ileum/TWO colon polyps removed/ The LEFT colon IS redundant/Small internal hemorrhoids. tubular adenoma. next TCS 12/2018   ESOPHAGOGASTRODUODENOSCOPY N/A 02/06/2014   SLF: 1. No obvious reason for weight loss identified 2. Dyspepsia due to MILD erosive gastritis.   FRACTURE SURGERY Right    20 years ago    There were no vitals filed for this visit.   Subjective Assessment - 01/20/21 1038     Subjective  S: I've been working on being more aware of my arm and letting it stay natural by my side.    Currently in Pain? No/denies                 [facility] OT Assessment - 01/20/21 1039       Assessment   Medical Diagnosis Lt thalmic infarct with pain and weakness      Precautions   Precautions Fall    Precaution Comments right side hemiparesis                      OT Treatments/Exercises (OP) - 01/20/21 1039       Exercises   Exercises Hand;Shoulder      Shoulder Exercises: ROM/Strengthening   UBE (Upper Arm Bike) level 1 2' forward 2' reverse pace: 4.5-5.0    Over Head Lace seated. laced chain from top down then unlaced      Hand Exercises   Other Hand Exercises Using red resisitve clothespin and 3 point pinch, patient was able to pick up 30 sponges and place in container. Used same color clothespin for sponge stack (3 high). 5 total completed.      Fine Motor Coordination (Hand/Wrist)   Fine Motor Coordination Flipping cards;Dealing card with thumb;Grooved pegs    Flipping cards Using right hand, patient flipped one card from deck  (on top then to the right side of  deck) focusing on speed and coordination.    Dealing card with thumb Held deck in right hand and dealt cards while pushing card to the side versus up/down.    Grooved pegs Picked up 5 pegs one at time transfered to palm. Then transfered pegs one at a time to fingertip before placing in pegboard.                      OT Short Term Goals - 01/13/21 0810       OT SHORT TERM GOAL #1   Title Patient will return to using her right UE as her dominant extremity for 75% or more of her daily tasks.    Time 4    Period Weeks    Status On-going    Target Date 02/02/21      OT SHORT TERM GOAL #2   Title Patient will increase her right hand grip strength to 45# and her pinch strength to 11-12# in order to open tight or new jars with less difficulty.    Time 4    Period Weeks    Status On-going      OT SHORT TERM GOAL #3   Title Patient will icrease her right hand coordination by completing the 9 hole peg test in 25" or less while  demonstrating greater motor control and fluidity when completing tasks such as meal prep and dressing.    Time 4    Period Weeks    Status On-going      OT SHORT TERM GOAL #4   Title Patient will demonstrate a functional resting arm position with her RUE 100% of the time while showing improved body awareness and decreased heaviness.    Time 4    Period Weeks    Status On-going                      Plan - 01/20/21 1124     Clinical Impression Statement A: Pt reports that she is using her RUE as her dominant extremity approximately 50% of the time. use of RUE has greatly improved from initial evaluation. Session focused on coordination and scapular and shoulder endurance. VC for form and technique were provided.    Body Structure / Function / Physical Skills ADL;UE functional use;Pain;FMC;ROM;Coordination;IADL;Strength    Plan P: proximal shoulder strengthening    Consulted and Agree with Plan of Care Patient             Patient will benefit from skilled therapeutic intervention in order to improve the following deficits and impairments:   Body Structure / Function / Physical Skills: ADL, UE functional use, Pain, FMC, ROM, Coordination, IADL, Strength       Visit Diagnosis: Other lack of coordination  Other symptoms and signs involving the musculoskeletal system    Problem List Patient Active Problem List   Diagnosis Date Noted   Stroke ([place]) 11/25/2020   Substance abuse ([place]) 11/25/2020   Abnormal Pap smear of cervix 06/24/2020   Encounter for screening fecal occult blood testing 06/14/2020   Encounter for gynecological examination with Papanicolaou smear of cervix 06/14/2020   Routine medical exam 06/14/2020   Smoker 06/14/2020   Postmenopause 06/14/2020   Constipation 08/03/2014   Current smoker 08/03/2014   HCV infection 08/03/2014   Vaginal discharge 07/24/2014   BV (bacterial vaginosis) 07/24/2014   Atrophic vaginitis 07/24/2014   Loss of weight  01/06/2014   Abdominal pain, unspecified site 01/06/2014   PRURITUS  09/28/2008   TRANSAMINASES, SERUM, ELEVATED 04/07/2008   ECZEMA 03/04/2008   Chronic hepatitis C virus infection ([place]) 12/10/2007   Hypercholesterolemia 12/10/2007   BIPOLAR DISORDER UNSPECIFIED 12/10/2007   TOBACCO ABUSE 12/10/2007   Essential hypertension 12/10/2007   ALLERGIC RHINITIS 12/10/2007   GERD 12/10/2007   LUPUS 12/10/2007   ARTHRITIS 12/10/2007   LOW BACK PAIN 12/10/2007   Pain in limb 12/10/2007    Ailene Ravel, OTR/L,CBIS  [phone]  01/20/2021, 11:30 AM  [place], [place], [address] Phone: [phone]   Fax:  [phone]  Name: Brittany Lester MRN: [id number] Date of Birth: 1961/05/04

## 2021-01-21 ENCOUNTER — Encounter (HOSPITAL_COMMUNITY): Payer: Medicare Other

## 2021-01-24 ENCOUNTER — Encounter (HOSPITAL_COMMUNITY): Payer: Medicare Other

## 2021-01-25 ENCOUNTER — Ambulatory Visit (HOSPITAL_COMMUNITY): Payer: Medicare Other | Admitting: Occupational Therapy

## 2021-01-25 ENCOUNTER — Other Ambulatory Visit: Payer: Self-pay

## 2021-01-25 ENCOUNTER — Encounter (HOSPITAL_COMMUNITY): Payer: Self-pay | Admitting: Occupational Therapy

## 2021-01-25 DIAGNOSIS — R29898 Other symptoms and signs involving the musculoskeletal system: Secondary | ICD-10-CM

## 2021-01-25 DIAGNOSIS — R296 Repeated falls: Secondary | ICD-10-CM | POA: Diagnosis not present

## 2021-01-25 DIAGNOSIS — R278 Other lack of coordination: Secondary | ICD-10-CM

## 2021-01-25 NOTE — Therapy (Signed)
[place], [place], [address] Phone: [phone]   Fax:  [phone]  Occupational Therapy Treatment  Patient Details  Name: Brittany Lester MRN: [id number] Date of Birth: 07-17-1960 Referring Provider (OT): Rosita Fire, MD   Encounter Date: 01/25/2021   OT End of Session - 01/25/21 1018     Visit Number 4    Number of Visits 8    Date for OT Re-Evaluation 02/02/21    Authorization Type 1) UHC medicare 2) [place] medicaid    Authorization Time Period no copay. no authorization needed.    Progress Note Due on Visit 10    OT Start Time [phone]    OT Stop Time 1024    OT Time Calculation (min) 41 min    Activity Tolerance Patient tolerated treatment well    Behavior During Therapy [facility] for tasks assessed/performed             Past Medical History:  Diagnosis Date   Atrophic vaginitis 07/24/2014   Bipolar disorder ([place])    BV (bacterial vaginosis) 07/24/2014   Hepatitis C    Harvoni   Hepatitis C    Herpes    Hyperlipidemia    Hypertension    LGSIL of cervix of undetermined significance 06/24/2020   2/22 will get Colpo   Lupus ([place])    ???   Medical history non-contributory    Vaginal discharge 07/24/2014    Past Surgical History:  Procedure Laterality Date   c section  12/18/1993   CESAREAN SECTION     COLONOSCOPY N/A 12/24/2013   DJT:TSVXBL mucosa in the terminal ileum/TWO colon polyps removed/ The LEFT colon IS redundant/Small internal hemorrhoids. tubular adenoma. next TCS 12/2018   ESOPHAGOGASTRODUODENOSCOPY N/A 02/06/2014   SLF: 1. No obvious reason for weight loss identified 2. Dyspepsia due to MILD erosive gastritis.   FRACTURE SURGERY Right    20 years ago    There were no vitals filed for this visit.   Subjective Assessment - 01/25/21 0939     Subjective  S: It's been going well.    Currently in Pain? No/denies                [facility] OT Assessment - 01/25/21 0939       Assessment    Medical Diagnosis Lt thalmic infarct with pain and weakness      Precautions   Precautions Fall    Precaution Comments right side hemiparesis                      OT Treatments/Exercises (OP) - 01/25/21 0948       Exercises   Exercises Hand;Shoulder;Theraputty      Shoulder Exercises: Standing   Extension Theraband;10 reps    Theraband Level (Shoulder Extension) Level 2 (Red)    Row Theraband;10 reps    Theraband Level (Shoulder Row) Level 2 (Red)    Retraction Theraband;10 reps    Theraband Level (Shoulder Retraction) Level 2 (Red)      Shoulder Exercises: Therapy Ball   Other Therapy Ball Exercises green therapy ball: chest press, flexion, overhead press, circles each direction, 10X each      Hand Exercises   Hand Gripper with Large Beads all beads gripper at 35#, vertical    Hand Gripper with Medium Beads all beads gripper at 35#, horizontal    Hand Gripper with Small Beads all beads gripper at 35#, horizontal    Other Hand Exercises  Using green resistive clothespin and 3 point pinch, patient was able to stack 4 towers of 5 sponges.Pt then using lateral pinch to place all 20 sponges back into bucket, using red clothespin    Other Hand Exercises Pt using pvc pipe to cut circles into red theraputty, working on sustained grip and RUE strength      Theraputty   Theraputty - Flatten red-standing      Fine Motor Coordination (Hand/Wrist)   Fine Motor Coordination Manipulating coins    Manipulating coins Pt holding coins in right hand and working on transferring coins from palm to fingertips and placing into slotted container. Increased time for task. Completed 2 rounds                      OT Short Term Goals - 01/13/21 0810       OT SHORT TERM GOAL #1   Title Patient will return to using her right UE as her dominant extremity for 75% or more of her daily tasks.    Time 4    Period Weeks    Status On-going    Target Date 02/02/21      OT SHORT  TERM GOAL #2   Title Patient will increase her right hand grip strength to 45# and her pinch strength to 11-12# in order to open tight or new jars with less difficulty.    Time 4    Period Weeks    Status On-going      OT SHORT TERM GOAL #3   Title Patient will icrease her right hand coordination by completing the 9 hole peg test in 25" or less while demonstrating greater motor control and fluidity when completing tasks such as meal prep and dressing.    Time 4    Period Weeks    Status On-going      OT SHORT TERM GOAL #4   Title Patient will demonstrate a functional resting arm position with her RUE 100% of the time while showing improved body awareness and decreased heaviness.    Time 4    Period Weeks    Status On-going                      Plan - 01/25/21 1014     Clinical Impression Statement A: Pt reports she is using her arm at home and letting it hang down by her side. Added proximal shoulder strengthening this session with scapular theraband work and therapy ball exercises. Continued with grip strengtheing increasing gripper resistance to 35# for all bead sizes. Continued with pinch strengthening increasing to green clothespin for 3 point pinch, continued with red for lateral pinch. Verbal cuing for form and technique, rest breaks provided as needed.    Body Structure / Function / Physical Skills ADL;UE functional use;Pain;FMC;ROM;Coordination;IADL;Strength    Plan P: Continue with proximal shoulder strengthening and add to HEP, fine motor task with tweezers    OT Home Exercise Plan eval: yellow putty, coordination tasks    Consulted and Agree with Plan of Care Patient             Patient will benefit from skilled therapeutic intervention in order to improve the following deficits and impairments:   Body Structure / Function / Physical Skills: ADL, UE functional use, Pain, FMC, ROM, Coordination, IADL, Strength       Visit Diagnosis: Other lack of  coordination  Other symptoms and signs involving the musculoskeletal system  Problem List Patient Active Problem List   Diagnosis Date Noted   Stroke [facility]) 11/25/2020   Substance abuse ([place]) 11/25/2020   Abnormal Pap smear of cervix 06/24/2020   Encounter for screening fecal occult blood testing 06/14/2020   Encounter for gynecological examination with Papanicolaou smear of cervix 06/14/2020   Routine medical exam 06/14/2020   Smoker 06/14/2020   Postmenopause 06/14/2020   Constipation 08/03/2014   Current smoker 08/03/2014   HCV infection 08/03/2014   Vaginal discharge 07/24/2014   BV (bacterial vaginosis) 07/24/2014   Atrophic vaginitis 07/24/2014   Loss of weight 01/06/2014   Abdominal pain, unspecified site 01/06/2014   PRURITUS 09/28/2008   TRANSAMINASES, SERUM, ELEVATED 04/07/2008   ECZEMA 03/04/2008   Chronic hepatitis C virus infection ([place]) 12/10/2007   Hypercholesterolemia 12/10/2007   BIPOLAR DISORDER UNSPECIFIED 12/10/2007   TOBACCO ABUSE 12/10/2007   Essential hypertension 12/10/2007   ALLERGIC RHINITIS 12/10/2007   GERD 12/10/2007   LUPUS 12/10/2007   ARTHRITIS 12/10/2007   LOW BACK PAIN 12/10/2007   Pain in limb 12/10/2007    Guadelupe Sabin, OTR/L  [phone] 01/25/2021, 10:24 AM  [facility] [address] [place], [place], [address] Phone: [phone]   Fax:  [phone]  Name: Brittany Lester MRN: [id number] Date of Birth: 11/07/1960

## 2021-01-26 ENCOUNTER — Encounter (HOSPITAL_COMMUNITY): Payer: Medicare Other | Admitting: Physical Therapy

## 2021-01-27 ENCOUNTER — Encounter (HOSPITAL_COMMUNITY): Payer: Self-pay | Admitting: Occupational Therapy

## 2021-01-27 ENCOUNTER — Ambulatory Visit (HOSPITAL_COMMUNITY): Payer: Medicare Other | Admitting: Occupational Therapy

## 2021-01-27 ENCOUNTER — Other Ambulatory Visit: Payer: Self-pay

## 2021-01-27 DIAGNOSIS — R296 Repeated falls: Secondary | ICD-10-CM | POA: Diagnosis not present

## 2021-01-27 DIAGNOSIS — R278 Other lack of coordination: Secondary | ICD-10-CM

## 2021-01-27 DIAGNOSIS — R29898 Other symptoms and signs involving the musculoskeletal system: Secondary | ICD-10-CM

## 2021-01-27 NOTE — Therapy (Signed)
[place], [place], [address] Phone: [phone]   Fax:  [phone]  Occupational Therapy Treatment  Patient Details  Name: Brittany Lester MRN: [id number] Date of Birth: 14-Jun-1960 Referring Provider (OT): Rosita Fire, MD   Encounter Date: 01/27/2021   OT End of Session - 01/27/21 1023     Visit Number 5    Number of Visits 8    Date for OT Re-Evaluation 02/02/21    Authorization Type 1) UHC medicare 2) [place] medicaid    Authorization Time Period no copay. no authorization needed.    Progress Note Due on Visit 10    OT Start Time [phone]    OT Stop Time 1028    OT Time Calculation (min) 41 min    Activity Tolerance Patient tolerated treatment well    Behavior During Therapy [facility] for tasks assessed/performed             Past Medical History:  Diagnosis Date   Atrophic vaginitis 07/24/2014   Bipolar disorder ([place])    BV (bacterial vaginosis) 07/24/2014   Hepatitis C    Harvoni   Hepatitis C    Herpes    Hyperlipidemia    Hypertension    LGSIL of cervix of undetermined significance 06/24/2020   2/22 will get Colpo   Lupus ([place])    ???   Medical history non-contributory    Vaginal discharge 07/24/2014    Past Surgical History:  Procedure Laterality Date   c section  12/18/1993   CESAREAN SECTION     COLONOSCOPY N/A 12/24/2013   UMP:NTIRWE mucosa in the terminal ileum/TWO colon polyps removed/ The LEFT colon IS redundant/Small internal hemorrhoids. tubular adenoma. next TCS 12/2018   ESOPHAGOGASTRODUODENOSCOPY N/A 02/06/2014   SLF: 1. No obvious reason for weight loss identified 2. Dyspepsia due to MILD erosive gastritis.   FRACTURE SURGERY Right    20 years ago    There were no vitals filed for this visit.   Subjective Assessment - 01/27/21 0947     Subjective  S: I guess some days are a little more painful than others.    Currently in Pain? Yes    Pain Score 7     Pain Location Arm    Pain  Orientation Right    Pain Descriptors / Indicators Heaviness    Pain Type Acute pain    Pain Radiating Towards None    Pain Onset More than a month ago    Pain Frequency Constant    Aggravating Factors  unsure    Pain Relieving Factors nothing    Effect of Pain on Daily Activities mod/max effect on ADLs    Multiple Pain Sites No                [facility] OT Assessment - 01/27/21 0946       Assessment   Medical Diagnosis Lt thalmic infarct with pain and weakness      Precautions   Precautions Fall    Precaution Comments right side hemiparesis                      OT Treatments/Exercises (OP) - 01/27/21 0953       Exercises   Exercises Hand;Shoulder;Theraputty      Shoulder Exercises: Standing   Extension Theraband;10 reps    Theraband Level (Shoulder Extension) Level 2 (Red)    Row Theraband;10 reps    Theraband Level (Shoulder Row) Level 2 (Red)  Retraction Theraband;10 reps    Theraband Level (Shoulder Retraction) Level 2 (Red)      Shoulder Exercises: Therapy Ball   Other Therapy Ball Exercises basketball: chest press, flexion, overhead press, circles each direction, 10X each      Hand Exercises   Hand Gripper with Large Beads all beads gripper at 35#, vertical    Hand Gripper with Medium Beads all beads gripper at 35#, vertical    Hand Gripper with Small Beads all beads gripper at 35#, horizontal      Fine Motor Coordination (Hand/Wrist)   Fine Motor Coordination Grooved pegs;Small Pegboard    Small Pegboard Pt using tweezers to place pegs into small pegboard, increased time for task.    Grooved pegs Pt using tweezers to place pegs into pegboard working on sustained pinch and fine motor coordination. Pt placing 1 peg in 10 minutes, transitioned to small pegboard                    OT Education - 01/27/21 1000     Education Details proximal shoulder strengthening-red theraband, therapy ball exercises    Person(s) Educated Patient     Methods Explanation;Demonstration;Handout    Comprehension Verbalized understanding              OT Short Term Goals - 01/13/21 0810       OT SHORT TERM GOAL #1   Title Patient will return to using her right UE as her dominant extremity for 75% or more of her daily tasks.    Time 4    Period Weeks    Status On-going    Target Date 02/02/21      OT SHORT TERM GOAL #2   Title Patient will increase her right hand grip strength to 45# and her pinch strength to 11-12# in order to open tight or new jars with less difficulty.    Time 4    Period Weeks    Status On-going      OT SHORT TERM GOAL #3   Title Patient will icrease her right hand coordination by completing the 9 hole peg test in 25" or less while demonstrating greater motor control and fluidity when completing tasks such as meal prep and dressing.    Time 4    Period Weeks    Status On-going      OT SHORT TERM GOAL #4   Title Patient will demonstrate a functional resting arm position with her RUE 100% of the time while showing improved body awareness and decreased heaviness.    Time 4    Period Weeks    Status On-going                      Plan - 01/27/21 1010     Clinical Impression Statement A: Continued with proximal shoulder strengthening and added exercise to HEP. Continued with grip strengthening, fine motor coordination. Added tweezers to fine motor task working on sustained pinch and fine motor manipulation, began with grooved pegboard and transitioned to small pegboard to downgrade task. Verbal cuing for form and technique, significantly increased time for tweezer task.    Body Structure / Function / Physical Skills ADL;UE functional use;Pain;FMC;ROM;Coordination;IADL;Strength    Plan P: Reassessment    OT Home Exercise Plan eval: yellow putty, coordination tasks; 9/22: proximal shoulder strengthening    Consulted and Agree with Plan of Care Patient             Patient will  benefit from  skilled therapeutic intervention in order to improve the following deficits and impairments:   Body Structure / Function / Physical Skills: ADL, UE functional use, Pain, FMC, ROM, Coordination, IADL, Strength       Visit Diagnosis: Other lack of coordination  Other symptoms and signs involving the musculoskeletal system    Problem List Patient Active Problem List   Diagnosis Date Noted   Stroke ([place]) 11/25/2020   Substance abuse ([place]) 11/25/2020   Abnormal Pap smear of cervix 06/24/2020   Encounter for screening fecal occult blood testing 06/14/2020   Encounter for gynecological examination with Papanicolaou smear of cervix 06/14/2020   Routine medical exam 06/14/2020   Smoker 06/14/2020   Postmenopause 06/14/2020   Constipation 08/03/2014   Current smoker 08/03/2014   HCV infection 08/03/2014   Vaginal discharge 07/24/2014   BV (bacterial vaginosis) 07/24/2014   Atrophic vaginitis 07/24/2014   Loss of weight 01/06/2014   Abdominal pain, unspecified site 01/06/2014   PRURITUS 09/28/2008   TRANSAMINASES, SERUM, ELEVATED 04/07/2008   ECZEMA 03/04/2008   Chronic hepatitis C virus infection ([place]) 12/10/2007   Hypercholesterolemia 12/10/2007   BIPOLAR DISORDER UNSPECIFIED 12/10/2007   TOBACCO ABUSE 12/10/2007   Essential hypertension 12/10/2007   ALLERGIC RHINITIS 12/10/2007   GERD 12/10/2007   LUPUS 12/10/2007   ARTHRITIS 12/10/2007   LOW BACK PAIN 12/10/2007   Pain in limb 12/10/2007    Guadelupe Sabin, OTR/L  [phone] 01/27/2021, 11:12 AM  [facility] [address] [place], [place], [address] Phone: [phone]   Fax:  [phone]  Name: Shianna Bally MRN: [id number] Date of Birth: [birth year]

## 2021-01-27 NOTE — Patient Instructions (Signed)
  1) (Home) Extension: Isometric / Bilateral Arm Retraction - Sitting   Facing anchor, hold hands and elbow at shoulder height, with elbow bent.  Pull arms back to squeeze shoulder blades together. Repeat 10-15 times. 1-3 times/day.   2) (Clinic) Extension / Flexion (Assist)   Face anchor, pull arms back, keeping elbow straight, and squeze shoulder blades together. Repeat 10-15 times. 1-3 times/day.   Copyright  VHI. All rights reserved.   3) (Home) Retraction: Row - Bilateral (Anchor)   Facing anchor, arms reaching forward, pull hands toward stomach, keeping elbows bent and at your sides and pinching shoulder blades together. Repeat 10-15 times. 1-3 times/day.   Copyright  VHI. All rights reserved.    Therapy Ball Strengthening Exercises: Complete all exercises 10-15X each, 2x/day.   1) Overhead press: Hold a medicine ball or other ball at your chest. Next, extend your elbows and push the ball over your head as shown. Return to starting position and repeat.     2) Chest press: Hold a medicine ball or other ball at your chest. Next, extend your elbows and push the ball outward in front of your body as shown. Return to starting position and repeat.     3) Ball circles:  Hold a medicine ball or other ball at your chest. Next, extend your elbows and push the ball outward in front of your body as shown. Then, move the ball in a circular pattern for several revolutions and then reverse the direction.     4) Flexion: Start holding an exercise ball out in front of your body with elbows extended. Then, slowly lift the ball up over head. Return the ball to starting position and repeat.

## 2021-02-01 ENCOUNTER — Ambulatory Visit (HOSPITAL_COMMUNITY): Payer: Medicare Other | Admitting: Occupational Therapy

## 2021-02-03 ENCOUNTER — Encounter (HOSPITAL_COMMUNITY): Payer: Medicare Other

## 2021-05-11 ENCOUNTER — Telehealth: Payer: Self-pay | Admitting: Adult Health

## 2021-06-25 IMAGING — MG MM DIGITAL SCREENING BILAT W/ TOMO AND CAD
8 series · 9 of 24 positions shown · non-contrast
Comparison: None.

CLINICAL DATA: Screening.

EXAM:
DIGITAL SCREENING BILATERAL MAMMOGRAM WITH TOMO AND CAD

[L MLO synth-2D]
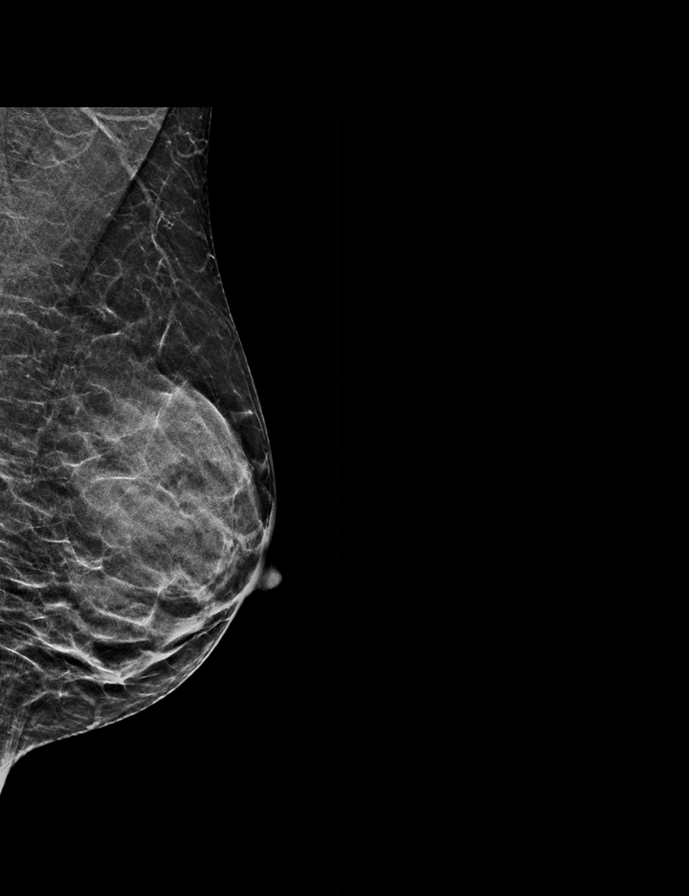

[R MLO synth-2D]
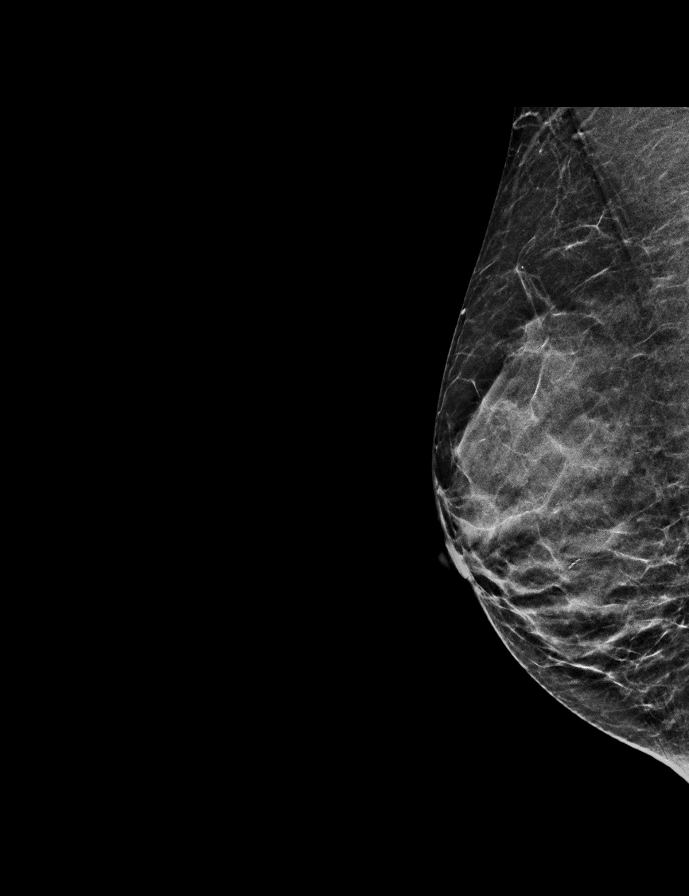

[R CC synth-2D]
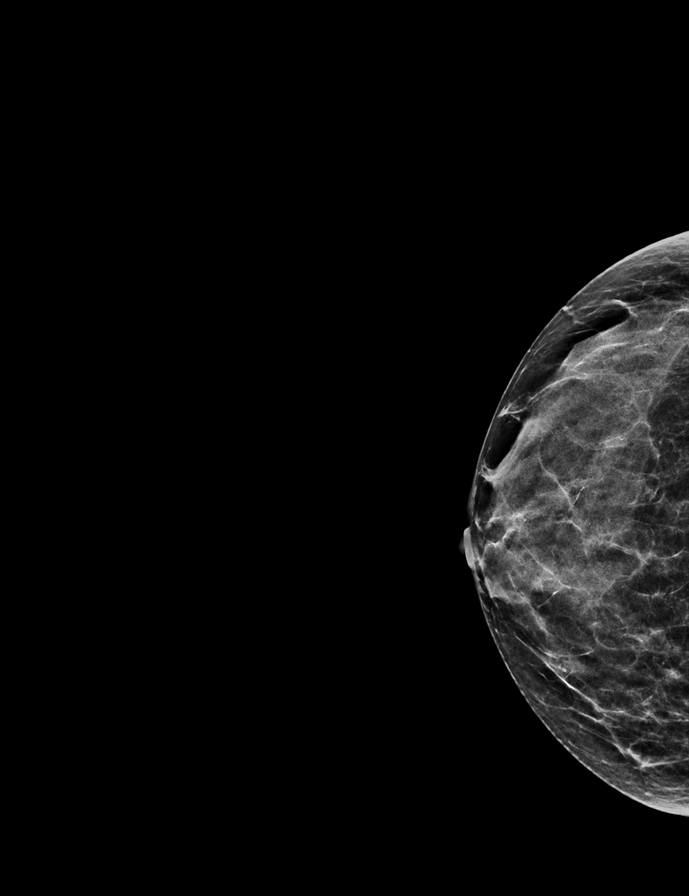

[L CC synth-2D]
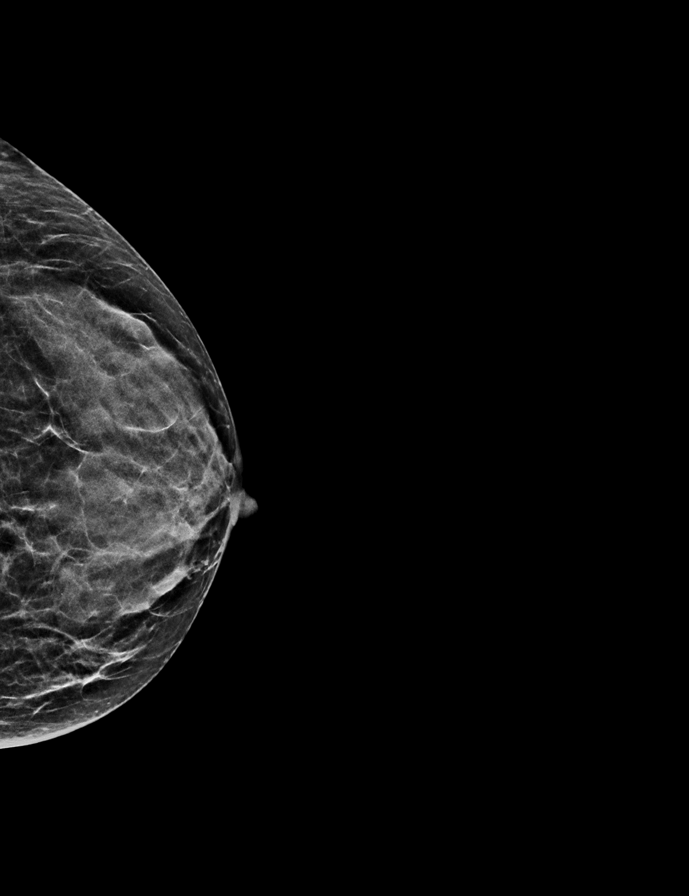

[L CC tomo · 2 of 35 frames shown]
[frame 12/35]
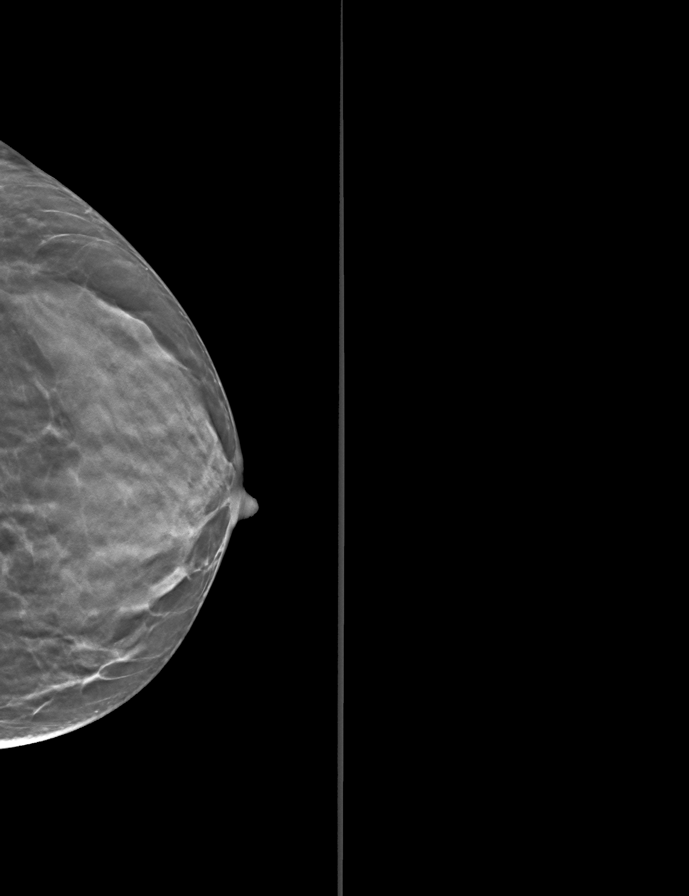
[frame 18/35]
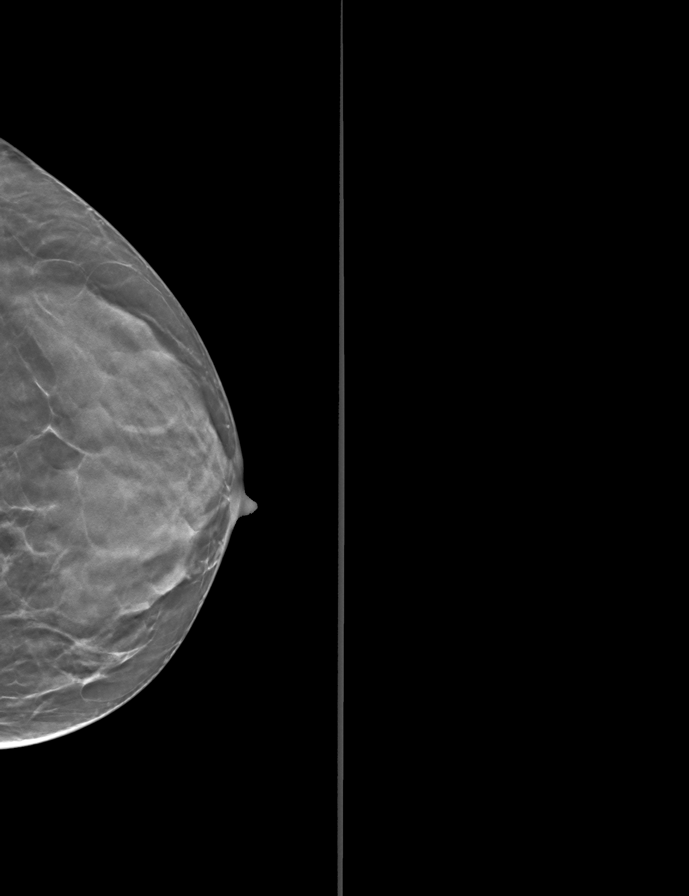

[R MLO tomo · tomo slice 17/34.0]
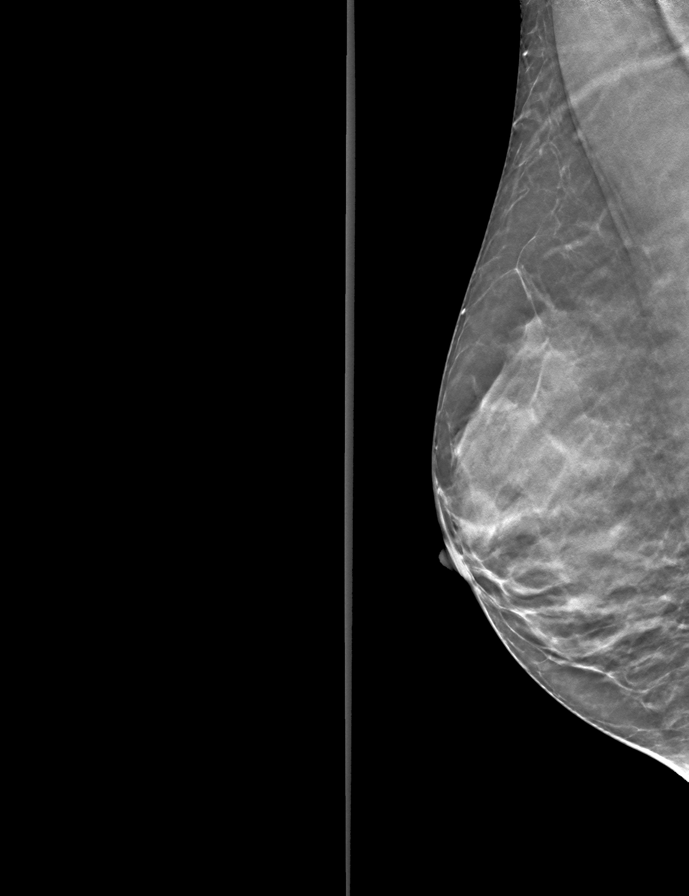

[R CC tomo · tomo slice 18/35.0]
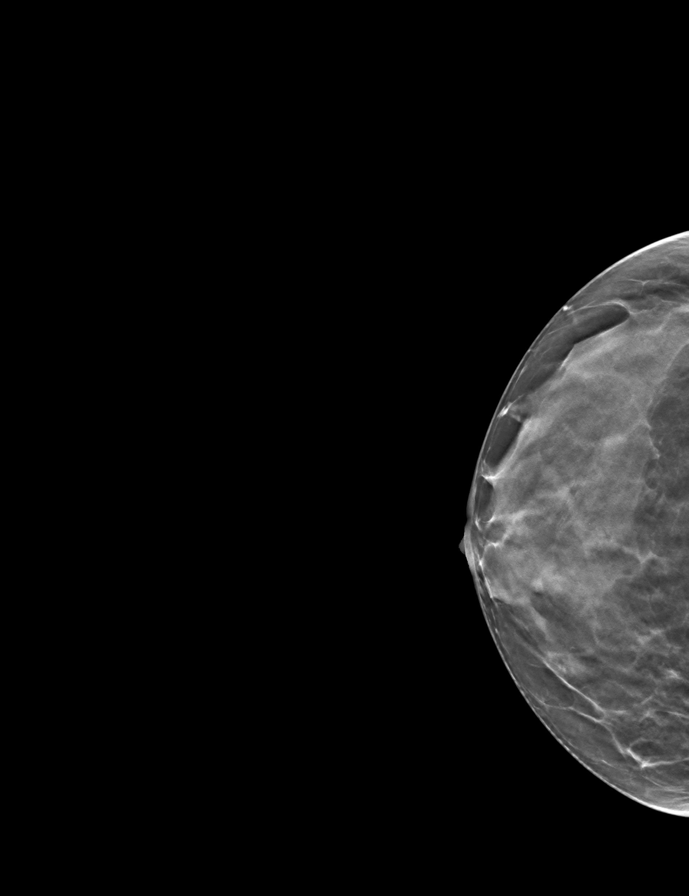

[L MLO tomo · tomo slice 17/34.0]
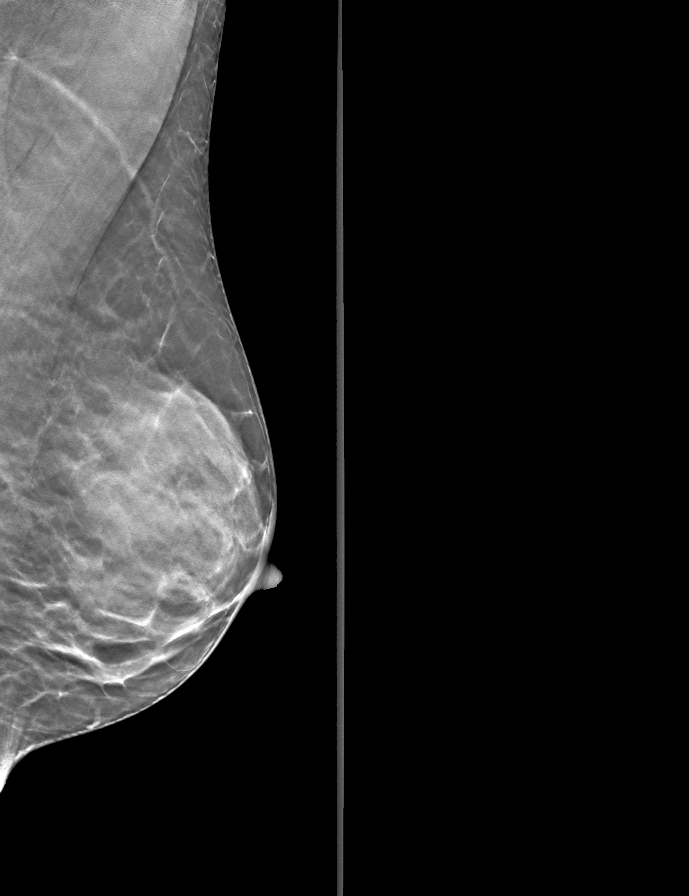

[9 of 24 positions shown; findings below may reference images not displayed]

ACR Breast Density Category d: The breast tissue is extremely dense,
which lowers the sensitivity of mammography.
FINDINGS: There are no findings suspicious for malignancy. The images were
evaluated with computer-aided detection.
IMPRESSION: No mammographic evidence of malignancy. A result letter of this
screening mammogram will be mailed directly to the patient.

RECOMMENDATION:
Screening mammogram in one year. (Code:D6-V-5A3)

BI-RADS CATEGORY  1: Negative.

## 2021-08-19 IMAGING — DX DG CHEST 2V
2 series · 2 of 2 positions shown · non-contrast
Comparison: May 19, 2020

CLINICAL DATA: Cough and shortness of breath

EXAM:
CHEST - 2 VIEW

[chest pa]
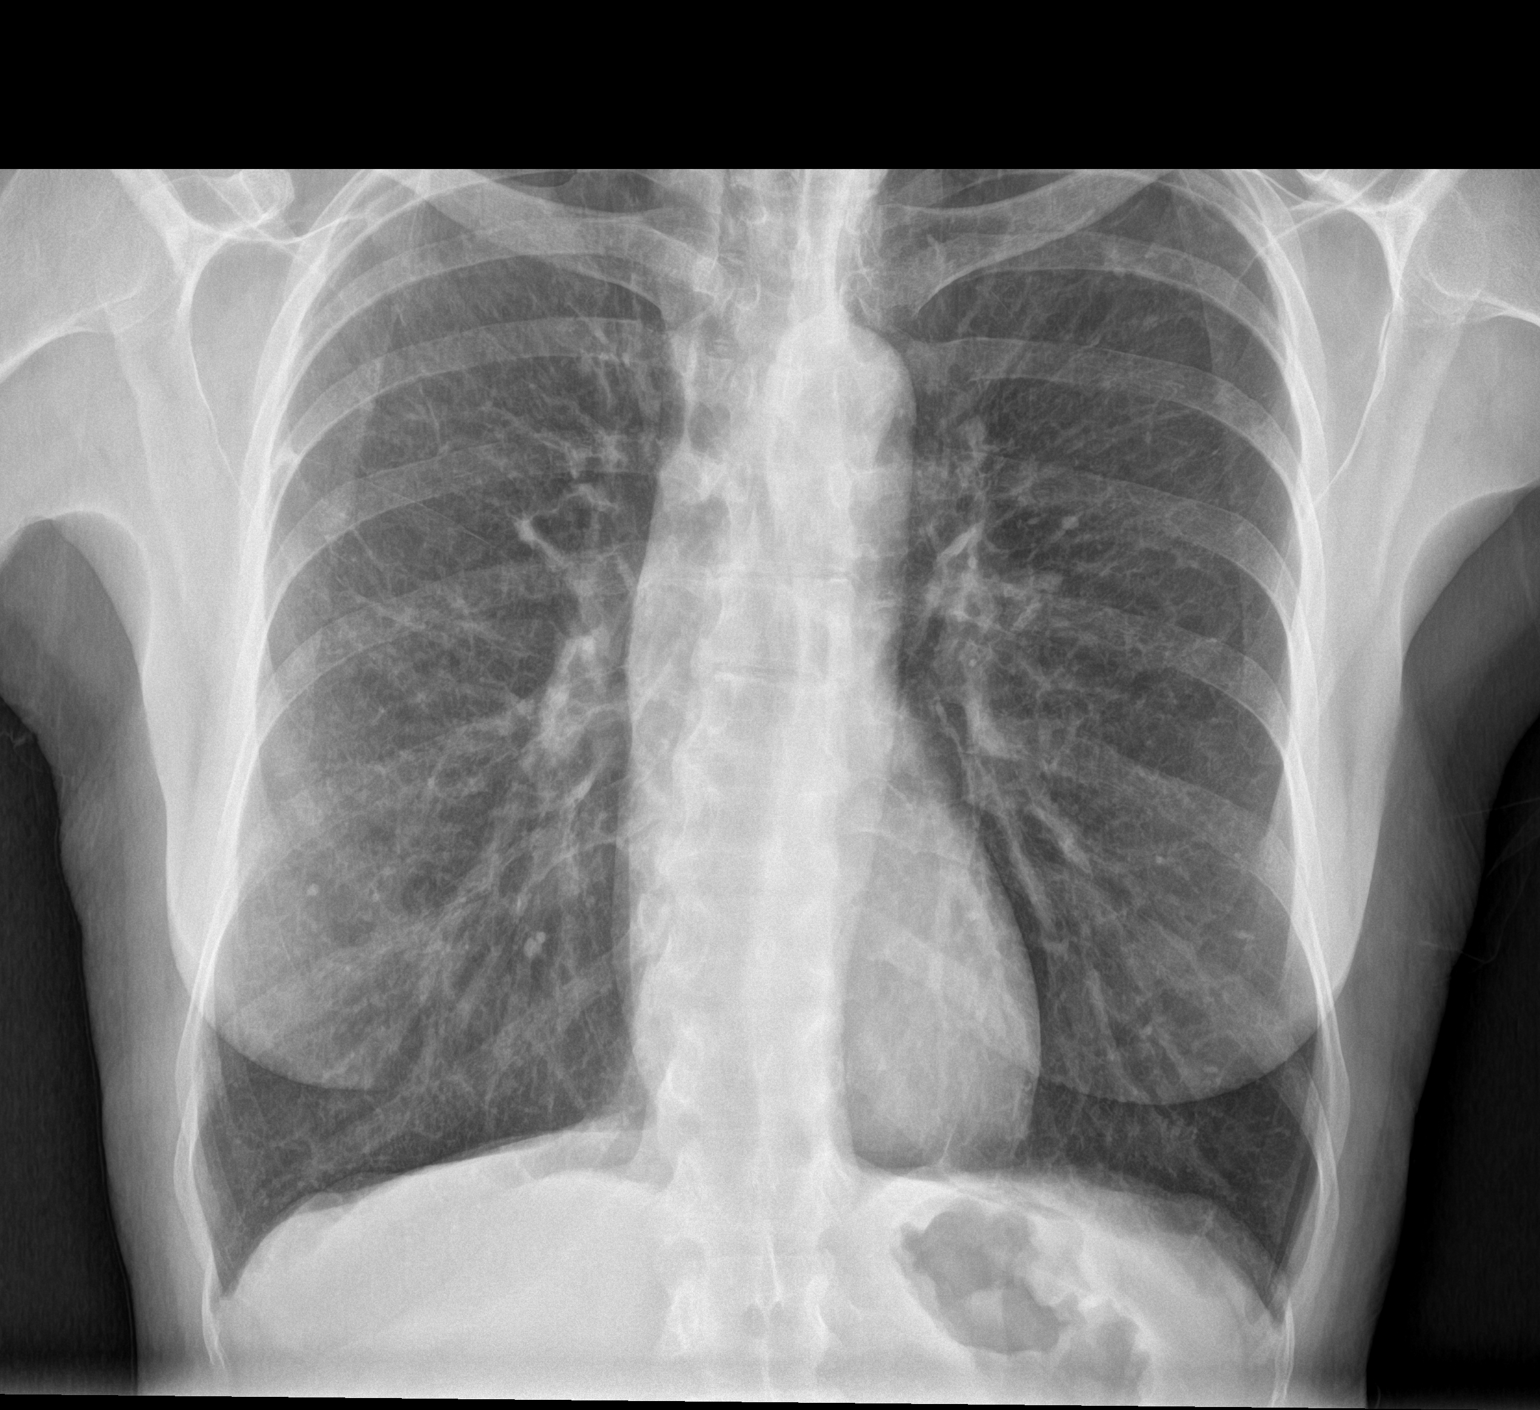

[chest lat]
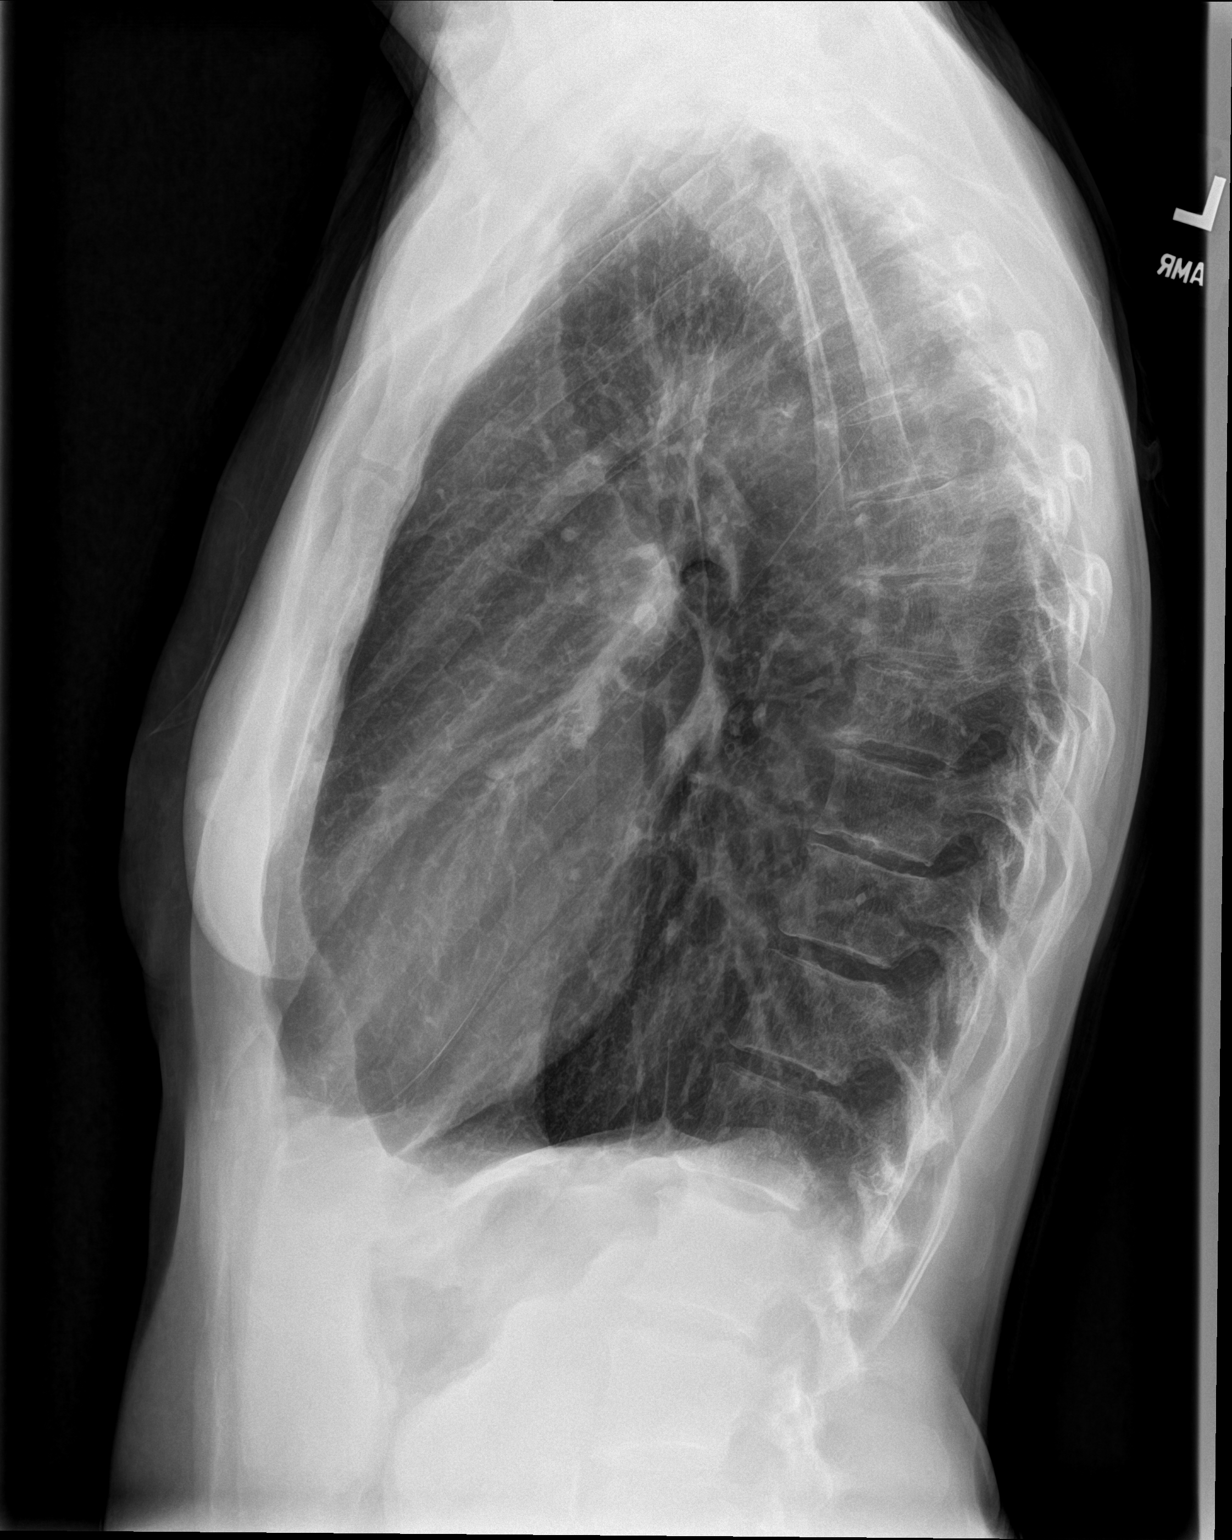

[2 of 2 positions shown; findings below may reference images not displayed]

FINDINGS: Lungs are hyperexpanded. There is slight scarring in the upper
lobes. There is no edema or airspace opacity. Scattered small
granulomas. Heart size and pulmonary vascularity are normal. No
adenopathy. No bone lesions.
IMPRESSION: Lungs hyperexpanded with areas of mild scarring. Scattered small
granulomas noted. No edema or airspace opacity. Heart size normal.

## 2022-05-15 DIAGNOSIS — F319 Bipolar disorder, unspecified: Secondary | ICD-10-CM | POA: Diagnosis not present

## 2022-05-15 DIAGNOSIS — I10 Essential (primary) hypertension: Secondary | ICD-10-CM | POA: Diagnosis not present

## 2022-06-15 DIAGNOSIS — I10 Essential (primary) hypertension: Secondary | ICD-10-CM | POA: Diagnosis not present

## 2022-06-15 DIAGNOSIS — F319 Bipolar disorder, unspecified: Secondary | ICD-10-CM | POA: Diagnosis not present

## 2022-07-14 DIAGNOSIS — F319 Bipolar disorder, unspecified: Secondary | ICD-10-CM | POA: Diagnosis not present

## 2022-07-14 DIAGNOSIS — I10 Essential (primary) hypertension: Secondary | ICD-10-CM | POA: Diagnosis not present

## 2022-07-27 DIAGNOSIS — I6930 Unspecified sequelae of cerebral infarction: Secondary | ICD-10-CM | POA: Diagnosis not present

## 2022-07-27 DIAGNOSIS — I10 Essential (primary) hypertension: Secondary | ICD-10-CM | POA: Diagnosis not present

## 2022-07-27 DIAGNOSIS — G890 Central pain syndrome: Secondary | ICD-10-CM | POA: Diagnosis not present

## 2022-08-14 DIAGNOSIS — F319 Bipolar disorder, unspecified: Secondary | ICD-10-CM | POA: Diagnosis not present

## 2022-08-14 DIAGNOSIS — I10 Essential (primary) hypertension: Secondary | ICD-10-CM | POA: Diagnosis not present

## 2022-08-28 DIAGNOSIS — I6930 Unspecified sequelae of cerebral infarction: Secondary | ICD-10-CM | POA: Diagnosis not present

## 2022-08-28 DIAGNOSIS — G890 Central pain syndrome: Secondary | ICD-10-CM | POA: Diagnosis not present

## 2022-08-28 DIAGNOSIS — I10 Essential (primary) hypertension: Secondary | ICD-10-CM | POA: Diagnosis not present

## 2022-09-13 DIAGNOSIS — I10 Essential (primary) hypertension: Secondary | ICD-10-CM | POA: Diagnosis not present

## 2022-09-13 DIAGNOSIS — F319 Bipolar disorder, unspecified: Secondary | ICD-10-CM | POA: Diagnosis not present

## 2022-09-22 ENCOUNTER — Other Ambulatory Visit (HOSPITAL_COMMUNITY): Payer: Self-pay | Admitting: Internal Medicine

## 2022-09-22 DIAGNOSIS — E440 Moderate protein-calorie malnutrition: Secondary | ICD-10-CM | POA: Diagnosis not present

## 2022-09-22 DIAGNOSIS — I10 Essential (primary) hypertension: Secondary | ICD-10-CM | POA: Diagnosis not present

## 2022-09-22 DIAGNOSIS — Z1389 Encounter for screening for other disorder: Secondary | ICD-10-CM | POA: Diagnosis not present

## 2022-09-22 DIAGNOSIS — Z1231 Encounter for screening mammogram for malignant neoplasm of breast: Secondary | ICD-10-CM

## 2022-09-22 DIAGNOSIS — Z1331 Encounter for screening for depression: Secondary | ICD-10-CM | POA: Diagnosis not present

## 2022-09-22 DIAGNOSIS — Z23 Encounter for immunization: Secondary | ICD-10-CM | POA: Diagnosis not present

## 2022-09-22 DIAGNOSIS — F17200 Nicotine dependence, unspecified, uncomplicated: Secondary | ICD-10-CM | POA: Diagnosis not present

## 2022-09-22 DIAGNOSIS — F319 Bipolar disorder, unspecified: Secondary | ICD-10-CM | POA: Diagnosis not present

## 2022-09-22 DIAGNOSIS — F17210 Nicotine dependence, cigarettes, uncomplicated: Secondary | ICD-10-CM | POA: Diagnosis not present

## 2022-09-22 DIAGNOSIS — Z0001 Encounter for general adult medical examination with abnormal findings: Secondary | ICD-10-CM | POA: Diagnosis not present

## 2022-09-27 ENCOUNTER — Other Ambulatory Visit (HOSPITAL_COMMUNITY): Payer: Self-pay | Admitting: Internal Medicine

## 2022-09-27 ENCOUNTER — Ambulatory Visit (HOSPITAL_COMMUNITY)
Admission: RE | Admit: 2022-09-27 | Discharge: 2022-09-27 | Disposition: A | Discharge status: Home / Self Care | Payer: Medicare HMO | Source: Ambulatory Visit | Attending: Internal Medicine | Admitting: Internal Medicine

## 2022-09-27 DIAGNOSIS — E440 Moderate protein-calorie malnutrition: Secondary | ICD-10-CM | POA: Diagnosis not present

## 2022-09-27 DIAGNOSIS — R0602 Shortness of breath: Secondary | ICD-10-CM

## 2022-09-27 DIAGNOSIS — E7849 Other hyperlipidemia: Secondary | ICD-10-CM | POA: Diagnosis not present

## 2022-09-27 DIAGNOSIS — I10 Essential (primary) hypertension: Secondary | ICD-10-CM | POA: Diagnosis not present

## 2022-09-27 DIAGNOSIS — Z0001 Encounter for general adult medical examination with abnormal findings: Secondary | ICD-10-CM | POA: Diagnosis not present

## 2022-09-27 DIAGNOSIS — Z1231 Encounter for screening mammogram for malignant neoplasm of breast: Secondary | ICD-10-CM | POA: Diagnosis not present

## 2022-10-09 DIAGNOSIS — I10 Essential (primary) hypertension: Secondary | ICD-10-CM | POA: Diagnosis not present

## 2022-10-09 DIAGNOSIS — G890 Central pain syndrome: Secondary | ICD-10-CM | POA: Diagnosis not present

## 2022-10-09 DIAGNOSIS — I6930 Unspecified sequelae of cerebral infarction: Secondary | ICD-10-CM | POA: Diagnosis not present
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
## 2022-10-23 DIAGNOSIS — I10 Essential (primary) hypertension: Secondary | ICD-10-CM | POA: Diagnosis not present

## 2022-10-23 DIAGNOSIS — F319 Bipolar disorder, unspecified: Secondary | ICD-10-CM | POA: Diagnosis not present

## 2022-10-25 ENCOUNTER — Telehealth: Payer: Self-pay | Admitting: *Deleted

## 2022-10-25 ENCOUNTER — Encounter: Payer: Self-pay | Admitting: Gastroenterology

## 2022-10-25 ENCOUNTER — Encounter: Payer: Self-pay | Admitting: *Deleted

## 2022-10-25 ENCOUNTER — Ambulatory Visit (INDEPENDENT_AMBULATORY_CARE_PROVIDER_SITE_OTHER): Payer: Medicare HMO | Admitting: Gastroenterology

## 2022-10-25 ENCOUNTER — Other Ambulatory Visit: Payer: Self-pay | Admitting: *Deleted

## 2022-10-25 VITALS — BP 132/82 | HR 82 | Temp 97.6°F | Ht 62.0 in | Wt 79.0 lb

## 2022-10-25 DIAGNOSIS — Z86010 Personal history of colonic polyps: Secondary | ICD-10-CM

## 2022-10-25 DIAGNOSIS — R634 Abnormal weight loss: Secondary | ICD-10-CM

## 2022-10-25 DIAGNOSIS — R1084 Generalized abdominal pain: Secondary | ICD-10-CM | POA: Diagnosis not present

## 2022-10-25 DIAGNOSIS — K5900 Constipation, unspecified: Secondary | ICD-10-CM

## 2022-10-25 DIAGNOSIS — R140 Abdominal distension (gaseous): Secondary | ICD-10-CM

## 2022-10-25 MED ORDER — PANTOPRAZOLE SODIUM 40 MG PO TBEC
40.0000 mg | DELAYED_RELEASE_TABLET | Freq: Every day | ORAL | 3 refills | Status: DC
Start: 2022-10-25 — End: 2023-01-22

## 2022-10-25 MED ORDER — PEG 3350-KCL-NA BICARB-NACL 420 G PO SOLR: 4000.0000 mL | Freq: Once | ORAL | 0 refills | Status: AC

## 2022-10-25 NOTE — Patient Instructions (Addendum)
We will arrange for you to have a colonoscopy and upper endoscopy in the near future with Dr. Marletta Lor to further evaluate your abdominal pain and weight loss.  Start pantoprazole 40 mg daily 30 minutes before breakfast.  Start MiraLAX/ClearLax 17 g daily in 8 ounces of water or other noncarbonated beverage of your choice.  This is to help with constipation.  The goal is for you to have a productive bowel movement every day.  You can increase MiraLAX to twice daily if needed.  Please keep in mind, MiraLAX takes a few days to get in your system and start working.  Avoid common gas producing items including broccoli, cabbage, Brussels sprouts, beans, cauliflower, artificial sweeteners, drinking through a straw, carbonated beverages, chewing gum, hard candy.  To prevent further weight loss, I recommend that you add 2 protein shakes daily  You can purchase something like Ensure, boost, or other high-protein shakes, or you can purchase protein powder and make your own.   We will follow-up with you in the office after your procedures.  Do not hesitate to call if you have questions or concerns.  It was very nice to meet you today!  Ermalinda Memos, PA-C [facility]

## 2022-10-25 NOTE — Telephone Encounter (Signed)
Pt given pre-op appointment on Tuesday, 11/14/22 at 1:45 pm at [facility] . Verbalized understanding.

## 2022-10-25 NOTE — Telephone Encounter (Signed)
[employer] PA: Approved Authorization #[id number]  Tracking #[id number] Dates of service 11/17/2022 - 02/16/2023

## 2022-10-27 ENCOUNTER — Encounter: Payer: Self-pay | Admitting: Gastroenterology

## 2022-10-31 ENCOUNTER — Encounter: Payer: Self-pay | Admitting: Gastroenterology

## 2022-11-06 ENCOUNTER — Telehealth: Payer: Self-pay | Admitting: *Deleted

## 2022-11-06 NOTE — Telephone Encounter (Signed)
Pt left vm regarding the date and time of her pre-op . She says she wrote it down but couldn't read it.   LMOVM appt date and time of pre-op. If any questions to call back.

## 2022-11-13 NOTE — Patient Instructions (Signed)
Brittany Lester  11/13/2022     @PREFPERIOPPHARMACY @   Your procedure is scheduled on  11/17/2022.   Report to [facility] at  0845  A.M.   Call this number if you have problems the morning of surgery:  [phone]  If you experience any cold or flu symptoms such as cough, fever, chills, shortness of breath, etc. between now and your scheduled surgery, please notify us at the above number.   Remember:  Follow the diet and prep instructions given to you by the office.     Take these medicines the morning of surgery with A SIP OF WATER          gabapentin, hydroxyzine, pantoprazole, tramadol(if needed).     Do not wear jewelry, make-up or nail polish, including gel polish,  artificial nails, or any other type of covering on natural nails (fingers and  toes).  Do not wear lotions, powders, or perfumes, or deodorant.  Do not shave 48 hours prior to surgery.  Men may shave face and neck.  Do not bring valuables to the hospital.  [facility] is not responsible for any belongings or valuables.  Contacts, dentures or bridgework may not be worn into surgery.  Leave your suitcase in the car.  After surgery it may be brought to your room.  For patients admitted to the hospital, discharge time will be determined by your treatment team.  Patients discharged the day of surgery will not be allowed to drive home and must have someone with them for 24 hours.    Special instructions:   DO NOT smoke tobacco or vape for 24 hours before your procedure.  Please read over the following fact sheets that you were given. Anesthesia Post-op Instructions and Care and Recovery After Surgery      Upper Endoscopy, Adult, Care After After the procedure, it is common to have a sore throat. It is also common to have: Mild stomach pain or discomfort. Bloating. Nausea. Follow these instructions at home: The instructions below may help you care for yourself at home. Your health care  provider may give you more instructions. If you have questions, ask your health care provider. If you were given a sedative during the procedure, it can affect you for several hours. Do not drive or operate machinery until your health care provider says that it is safe. If you will be going home right after the procedure, plan to have a responsible adult: Take you home from the hospital or clinic. You will not be allowed to drive. Care for you for the time you are told. Follow instructions from your health care provider about what you may eat and drink. Return to your normal activities as told by your health care provider. Ask your health care provider what activities are safe for you. Take over-the-counter and prescription medicines only as told by your health care provider. Contact a health care provider if you: Have a sore throat that lasts longer than one day. Have trouble swallowing. Have a fever. Get help right away if you: Vomit blood or your vomit looks like coffee grounds. Have bloody, black, or tarry stools. Have a very bad sore throat or you cannot swallow. Have difficulty breathing or very bad pain in your chest or abdomen. These symptoms may be an emergency. Get help right away. Call 911. Do not wait to see if the symptoms will go away. Do not drive yourself to the hospital. Summary  After the procedure, it is common to have a sore throat, mild stomach discomfort, bloating, and nausea. If you were given a sedative during the procedure, it can affect you for several hours. Do not drive until your health care provider says that it is safe. Follow instructions from your health care provider about what you may eat and drink. Return to your normal activities as told by your health care provider. This information is not intended to replace advice given to you by your health care provider. Make sure you discuss any questions you have with your health care provider. Document Revised:  08/03/2021 Document Reviewed: 08/03/2021 Elsevier Patient Education  2024 [employer] Colonoscopy, Adult, Care After The following information offers guidance on how to care for yourself after your procedure. Your health care provider may also give you more specific instructions. If you have problems or questions, contact your health care provider. What can I expect after the procedure? After the procedure, it is common to have: A small amount of blood in your stool for 24 hours after the procedure. Some gas. Mild cramping or bloating of your abdomen. Follow these instructions at home: Eating and drinking  Drink enough fluid to keep your urine pale yellow. Follow instructions from your health care provider about eating or drinking restrictions. Resume your normal diet as told by your health care provider. Avoid heavy or fried foods that are hard to digest. Activity Rest as told by your health care provider. Avoid sitting for a long time without moving. Get up to take short walks every 1-2 hours. This is important to improve blood flow and breathing. Ask for help if you feel weak or unsteady. Return to your normal activities as told by your health care provider. Ask your health care provider what activities are safe for you. Managing cramping and bloating  Try walking around when you have cramps or feel bloated. If directed, apply heat to your abdomen as told by your health care provider. Use the heat source that your health care provider recommends, such as a moist heat pack or a heating pad. Place a towel between your skin and the heat source. Leave the heat on for 20-30 minutes. Remove the heat if your skin turns bright red. This is especially important if you are unable to feel pain, heat, or cold. You have a greater risk of getting burned. General instructions If you were given a sedative during the procedure, it can affect you for several hours. Do not drive or operate machinery  until your health care provider says that it is safe. For the first 24 hours after the procedure: Do not sign important documents. Do not drink alcohol. Do your regular daily activities at a slower pace than normal. Eat soft foods that are easy to digest. Take over-the-counter and prescription medicines only as told by your health care provider. Keep all follow-up visits. This is important. Contact a health care provider if: You have blood in your stool 2-3 days after the procedure. Get help right away if: You have more than a small spotting of blood in your stool. You have large blood clots in your stool. You have swelling of your abdomen. You have nausea or vomiting. You have a fever. You have increasing pain in your abdomen that is not relieved with medicine. These symptoms may be an emergency. Get help right away. Call 911. Do not wait to see if the symptoms will go away. Do not drive yourself to the hospital. Summary  After the procedure, it is common to have a small amount of blood in your stool. You may also have mild cramping and bloating of your abdomen. If you were given a sedative during the procedure, it can affect you for several hours. Do not drive or operate machinery until your health care provider says that it is safe. Get help right away if you have a lot of blood in your stool, nausea or vomiting, a fever, or increased pain in your abdomen. This information is not intended to replace advice given to you by your health care provider. Make sure you discuss any questions you have with your health care provider. Document Revised: 06/06/2022 Document Reviewed: 12/15/2020 Elsevier Patient Education  2024 [employer] Inc. Monitored Anesthesia Care, Care After The following information offers guidance on how to care for yourself after your procedure. Your health care provider may also give you more specific instructions. If you have problems or questions, contact your health care  provider. What can I expect after the procedure? After the procedure, it is common to have: Tiredness. Little or no memory about what happened during or after the procedure. Impaired judgment when it comes to making decisions. Nausea or vomiting. Some trouble with balance. Follow these instructions at home: For the time period you were told by your health care provider:  Rest. Do not participate in activities where you could fall or become injured. Do not drive or use machinery. Do not drink alcohol. Do not take sleeping pills or medicines that cause drowsiness. Do not make important decisions or sign legal documents. Do not take care of children on your own. Medicines Take over-the-counter and prescription medicines only as told by your health care provider. If you were prescribed antibiotics, take them as told by your health care provider. Do not stop using the antibiotic even if you start to feel better. Eating and drinking Follow instructions from your health care provider about what you may eat and drink. Drink enough fluid to keep your urine pale yellow. If you vomit: Drink clear fluids slowly and in small amounts as you are able. Clear fluids include water, ice chips, low-calorie sports drinks, and fruit juice that has water added to it (diluted fruit juice). Eat light and bland foods in small amounts as you are able. These foods include bananas, applesauce, rice, lean meats, toast, and crackers. General instructions  Have a responsible adult stay with you for the time you are told. It is important to have someone help care for you until you are awake and alert. If you have sleep apnea, surgery and some medicines can increase your risk for breathing problems. Follow instructions from your health care provider about wearing your sleep device: When you are sleeping. This includes during daytime naps. While taking prescription pain medicines, sleeping medicines, or medicines that  make you drowsy. Do not use any products that contain nicotine or tobacco. These products include cigarettes, chewing tobacco, and vaping devices, such as e-cigarettes. If you need help quitting, ask your health care provider. Contact a health care provider if: You feel nauseous or vomit every time you eat or drink. You feel light-headed. You are still sleepy or having trouble with balance after 24 hours. You get a rash. You have a fever. You have redness or swelling around the IV site. Get help right away if: You have trouble breathing. You have new confusion after you get home. These symptoms may be an emergency. Get help right away. Call 911. Do  not wait to see if the symptoms will go away. Do not drive yourself to the hospital. This information is not intended to replace advice given to you by your health care provider. Make sure you discuss any questions you have with your health care provider. Document Revised: 09/19/2021 Document Reviewed: 09/19/2021 Elsevier Patient Education  2024 [employer].

## 2022-11-14 ENCOUNTER — Encounter (HOSPITAL_COMMUNITY)
Admission: RE | Admit: 2022-11-14 | Discharge: 2022-11-14 | Disposition: A | Discharge status: Home / Self Care | Payer: Medicare HMO | Source: Ambulatory Visit | Attending: Internal Medicine | Admitting: Internal Medicine

## 2022-11-14 ENCOUNTER — Encounter (HOSPITAL_COMMUNITY): Payer: Self-pay

## 2022-11-14 VITALS — BP 132/82 | HR 82 | Temp 97.6°F | Resp 18 | Ht 62.0 in | Wt 79.0 lb

## 2022-11-14 DIAGNOSIS — I10 Essential (primary) hypertension: Secondary | ICD-10-CM | POA: Diagnosis not present

## 2022-11-14 DIAGNOSIS — I517 Cardiomegaly: Secondary | ICD-10-CM | POA: Insufficient documentation

## 2022-11-14 DIAGNOSIS — R9431 Abnormal electrocardiogram [ECG] [EKG]: Secondary | ICD-10-CM | POA: Insufficient documentation

## 2022-11-14 DIAGNOSIS — Z01818 Encounter for other preprocedural examination: Secondary | ICD-10-CM | POA: Insufficient documentation

## 2022-11-14 DIAGNOSIS — F17200 Nicotine dependence, unspecified, uncomplicated: Secondary | ICD-10-CM | POA: Insufficient documentation

## 2022-11-14 DIAGNOSIS — B182 Chronic viral hepatitis C: Secondary | ICD-10-CM | POA: Diagnosis not present

## 2022-11-14 DIAGNOSIS — Z01812 Encounter for preprocedural laboratory examination: Secondary | ICD-10-CM | POA: Diagnosis present

## 2022-11-14 HISTORY — DX: Cerebral infarction, unspecified: I63.9

## 2022-11-14 LAB — CBC WITH DIFFERENTIAL/PLATELET
Abs Immature Granulocytes: 0.01 10*3/uL (ref 0.00–0.07)
Basophils Absolute: 0.1 10*3/uL (ref 0.0–0.1)
Basophils Relative: 1 %
Eosinophils Absolute: 0.2 10*3/uL (ref 0.0–0.5)
Eosinophils Relative: 5 %
HCT: 39.7 % (ref 36.0–46.0)
Hemoglobin: 13.1 g/dL (ref 12.0–15.0)
Immature Granulocytes: 0 %
Lymphocytes Relative: 28 %
Lymphs Abs: 1.5 10*3/uL (ref 0.7–4.0)
MCH: 29.9 pg (ref 26.0–34.0)
MCHC: 33 g/dL (ref 30.0–36.0)
MCV: 90.6 fL (ref 80.0–100.0)
Monocytes Absolute: 0.4 10*3/uL (ref 0.1–1.0)
Monocytes Relative: 8 %
Neutro Abs: 3.1 10*3/uL (ref 1.7–7.7)
Neutrophils Relative %: 58 %
Platelets: 210 10*3/uL (ref 150–400)
RBC: 4.38 MIL/uL (ref 3.87–5.11)
RDW: 13 % (ref 11.5–15.5)
WBC: 5.3 10*3/uL (ref 4.0–10.5)
nRBC: 0 % (ref 0.0–0.2)

## 2022-11-14 LAB — COMPREHENSIVE METABOLIC PANEL
ALT: 51 U/L — ABNORMAL HIGH (ref 0–44)
AST: 65 U/L — ABNORMAL HIGH (ref 15–41)
Albumin: 3.9 g/dL (ref 3.5–5.0)
Alkaline Phosphatase: 108 U/L (ref 38–126)
Anion gap: 8 (ref 5–15)
BUN: 29 mg/dL — ABNORMAL HIGH (ref 8–23)
CO2: 26 mmol/L (ref 22–32)
Calcium: 9 mg/dL (ref 8.9–10.3)
Chloride: 102 mmol/L (ref 98–111)
Creatinine, Ser: 0.93 mg/dL (ref 0.44–1.00)
GFR, Estimated: >60 mL/min (ref >60)
Glucose, Bld: 81 mg/dL (ref 70–99)
Potassium: 3.6 mmol/L (ref 3.5–5.1)
Sodium: 136 mmol/L (ref 135–145)
Total Bilirubin: 0.4 mg/dL (ref 0.3–1.2)
Total Protein: 7.1 g/dL (ref 6.5–8.1)

## 2022-11-14 LAB — PROTIME-INR
INR: 0.9 (ref 0.8–1.2)
Prothrombin Time: 12.7 seconds (ref 11.4–15.2)

## 2022-11-17 ENCOUNTER — Ambulatory Visit (HOSPITAL_BASED_OUTPATIENT_CLINIC_OR_DEPARTMENT_OTHER): Payer: Medicare HMO | Admitting: Anesthesiology

## 2022-11-17 ENCOUNTER — Ambulatory Visit (HOSPITAL_COMMUNITY): Payer: Medicare HMO | Admitting: Anesthesiology

## 2022-11-17 ENCOUNTER — Telehealth: Payer: Self-pay | Admitting: Internal Medicine

## 2022-11-17 ENCOUNTER — Encounter (HOSPITAL_COMMUNITY)
Admission: RE | Disposition: A | Discharge status: Home / Self Care | Payer: Self-pay | Source: Home / Self Care | Attending: Internal Medicine

## 2022-11-17 ENCOUNTER — Ambulatory Visit (HOSPITAL_COMMUNITY)
Admission: RE | Admit: 2022-11-17 | Discharge: 2022-11-17 | Disposition: A | Discharge status: Home / Self Care | Payer: Medicare HMO | Attending: Internal Medicine | Admitting: Internal Medicine

## 2022-11-17 DIAGNOSIS — D120 Benign neoplasm of cecum: Secondary | ICD-10-CM | POA: Diagnosis not present

## 2022-11-17 DIAGNOSIS — F17200 Nicotine dependence, unspecified, uncomplicated: Secondary | ICD-10-CM | POA: Diagnosis not present

## 2022-11-17 DIAGNOSIS — R1013 Epigastric pain: Secondary | ICD-10-CM | POA: Insufficient documentation

## 2022-11-17 DIAGNOSIS — K5140 Inflammatory polyps of colon without complications: Secondary | ICD-10-CM | POA: Insufficient documentation

## 2022-11-17 DIAGNOSIS — Z8619 Personal history of other infectious and parasitic diseases: Secondary | ICD-10-CM | POA: Diagnosis not present

## 2022-11-17 DIAGNOSIS — K2289 Other specified disease of esophagus: Secondary | ICD-10-CM | POA: Diagnosis not present

## 2022-11-17 DIAGNOSIS — F319 Bipolar disorder, unspecified: Secondary | ICD-10-CM | POA: Diagnosis not present

## 2022-11-17 DIAGNOSIS — Z8673 Personal history of transient ischemic attack (TIA), and cerebral infarction without residual deficits: Secondary | ICD-10-CM | POA: Diagnosis not present

## 2022-11-17 DIAGNOSIS — R079 Chest pain, unspecified: Secondary | ICD-10-CM

## 2022-11-17 DIAGNOSIS — I10 Essential (primary) hypertension: Secondary | ICD-10-CM | POA: Diagnosis not present

## 2022-11-17 DIAGNOSIS — R634 Abnormal weight loss: Secondary | ICD-10-CM | POA: Diagnosis not present

## 2022-11-17 DIAGNOSIS — E785 Hyperlipidemia, unspecified: Secondary | ICD-10-CM | POA: Insufficient documentation

## 2022-11-17 DIAGNOSIS — K648 Other hemorrhoids: Secondary | ICD-10-CM | POA: Insufficient documentation

## 2022-11-17 DIAGNOSIS — J449 Chronic obstructive pulmonary disease, unspecified: Secondary | ICD-10-CM | POA: Insufficient documentation

## 2022-11-17 DIAGNOSIS — K2970 Gastritis, unspecified, without bleeding: Secondary | ICD-10-CM | POA: Insufficient documentation

## 2022-11-17 DIAGNOSIS — Z1211 Encounter for screening for malignant neoplasm of colon: Secondary | ICD-10-CM | POA: Diagnosis not present

## 2022-11-17 DIAGNOSIS — Z86010 Personal history of colonic polyps: Secondary | ICD-10-CM | POA: Diagnosis not present

## 2022-11-17 DIAGNOSIS — K319 Disease of stomach and duodenum, unspecified: Secondary | ICD-10-CM | POA: Insufficient documentation

## 2022-11-17 DIAGNOSIS — K219 Gastro-esophageal reflux disease without esophagitis: Secondary | ICD-10-CM | POA: Insufficient documentation

## 2022-11-17 DIAGNOSIS — Z681 Body mass index (BMI) 19 or less, adult: Secondary | ICD-10-CM | POA: Diagnosis not present

## 2022-11-17 DIAGNOSIS — F17210 Nicotine dependence, cigarettes, uncomplicated: Secondary | ICD-10-CM | POA: Diagnosis not present

## 2022-11-17 DIAGNOSIS — Z79899 Other long term (current) drug therapy: Secondary | ICD-10-CM | POA: Insufficient documentation

## 2022-11-17 DIAGNOSIS — D122 Benign neoplasm of ascending colon: Secondary | ICD-10-CM | POA: Diagnosis not present

## 2022-11-17 DIAGNOSIS — K635 Polyp of colon: Secondary | ICD-10-CM | POA: Diagnosis not present

## 2022-11-17 HISTORY — PX: COLONOSCOPY WITH PROPOFOL: SHX5780

## 2022-11-17 HISTORY — PX: BIOPSY: SHX5522

## 2022-11-17 HISTORY — PX: ESOPHAGOGASTRODUODENOSCOPY (EGD) WITH PROPOFOL: SHX5813

## 2022-11-17 HISTORY — PX: POLYPECTOMY: SHX5525

## 2022-11-17 SURGERY — COLONOSCOPY WITH PROPOFOL
Anesthesia: General

## 2022-11-17 MED ORDER — PROPOFOL 500 MG/50ML IV EMUL
INTRAVENOUS | Status: DC | PRN
  Administered 2022-11-17: 150 ug/kg/min via INTRAVENOUS

## 2022-11-17 MED ORDER — PROPOFOL 10 MG/ML IV BOLUS
INTRAVENOUS | Status: DC | PRN
  Administered 2022-11-17: 40 mg via INTRAVENOUS
  Administered 2022-11-17: 80 mg via INTRAVENOUS

## 2022-11-17 MED ORDER — LACTATED RINGERS IV SOLN: INTRAVENOUS | Status: DC

## 2022-11-17 MED ORDER — STERILE WATER FOR IRRIGATION IR SOLN
Status: DC | PRN
  Administered 2022-11-17: 120 mL

## 2022-11-17 MED ORDER — LIDOCAINE HCL (CARDIAC) PF 100 MG/5ML IV SOSY
PREFILLED_SYRINGE | INTRAVENOUS | Status: DC | PRN
  Administered 2022-11-17: 50 mg via INTRAVENOUS

## 2022-11-17 NOTE — Interval H&P Note (Signed)
History and Physical Interval Note:  11/17/2022 9:53 AM  Brittany Lester  has presented today for surgery, with the diagnosis of [id number] colon polyps,weight loss,.  The various methods of treatment have been discussed with the patient and family. After consideration of risks, benefits and other options for treatment, the patient has consented to  Procedure(s) with comments: COLONOSCOPY WITH PROPOFOL (N/A) - 10:45 am, asa 3 ESOPHAGOGASTRODUODENOSCOPY (EGD) WITH PROPOFOL (N/A) as a surgical intervention.  The patient's history has been reviewed, patient examined, no change in status, stable for surgery.  I have reviewed the patient's chart and labs.  Questions were answered to the patient's satisfaction.     Lanelle Bal

## 2022-11-17 NOTE — Op Note (Signed)
[facility] Patient Name: Brittany Lester Procedure Date: 11/17/2022 9:47 AM MRN: [id number] Date of Birth: [birth year] Attending MD: Hennie Duos. Marletta Lor , [place], [id number] CSN: [id number] Age: 62 Admit Type: Outpatient Procedure:                Upper GI endoscopy Indications:              Epigastric abdominal pain, Weight loss Providers:                Hennie Duos. Marletta Lor, DO, Sheran Fava, Zena Amos Referring MD:              Medicines:                See the Anesthesia note for documentation of the                            administered medications Complications:            No immediate complications. Estimated Blood Loss:     Estimated blood loss was minimal. Procedure:                Pre-Anesthesia Assessment:                           - The anesthesia plan was to use monitored                            anesthesia care (MAC).                           After obtaining informed consent, the endoscope was                            passed under direct vision. Throughout the                            procedure, the patient's blood pressure, pulse, and                            oxygen saturations were monitored continuously. The                            [id number]-H190 ([id number]) scope was introduced through the                            mouth, and advanced to the second part of duodenum.                            The upper GI endoscopy was accomplished without                            difficulty. The patient tolerated the procedure                            well. Scope In: 10:06:21  AM Scope Out: 10:10:20 AM Total Procedure Duration: 0 hours 3 minutes 59 seconds  Findings:      There were esophageal mucosal changes suggestive of short-segment       Barrett's esophagus present at the gastroesophageal junction. The       maximum longitudinal extent of these mucosal changes was 2 cm in length.       Mucosa was biopsied with a cold forceps for  histology. One specimen       bottle was sent to pathology.      Diffuse moderate inflammation characterized by erosions and erythema was       found in the entire examined stomach. Biopsies were taken with a cold       forceps for Helicobacter pylori testing.      The duodenal bulb, first portion of the duodenum and second portion of       the duodenum were normal. Impression:               - Esophageal mucosal changes suggestive of                            short-segment Barrett's esophagus. Biopsied.                           - Gastritis. Biopsied.                           - Normal duodenal bulb, first portion of the                            duodenum and second portion of the duodenum. Moderate Sedation:      Per Anesthesia Care Recommendation:           - Patient has a contact number available for                            emergencies. The signs and symptoms of potential                            delayed complications were discussed with the                            patient. Return to normal activities tomorrow.                            Written discharge instructions were provided to the                            patient.                           - Resume previous diet.                           - Continue present medications.                           - Await pathology results.                           -  Use Protonix (pantoprazole) 40 mg PO daily.                           - Return to GI clinic in 3 months. Procedure Code(s):        --- Professional ---                           [phone], Esophagogastroduodenoscopy, flexible,                            transoral; with biopsy, single or multiple Diagnosis Code(s):        --- Professional ---                           K22.89, Other specified disease of esophagus                           K29.70, Gastritis, unspecified, without bleeding                           R10.13, Epigastric pain                           R63.4,  Abnormal weight loss CPT copyright 2022 American Medical Association. All rights reserved. The codes documented in this report are preliminary and upon coder review may  be revised to meet current compliance requirements. Hennie Duos. Marletta Lor, DO Hennie Duos. Marletta Lor, DO 11/17/2022 10:12:36 AM This report has been signed electronically. Number of Addenda: 0

## 2022-11-17 NOTE — Anesthesia Postprocedure Evaluation (Signed)
Anesthesia Post Note  Patient: Brittany Lester  Procedure(s) Performed: COLONOSCOPY WITH PROPOFOL ESOPHAGOGASTRODUODENOSCOPY (EGD) WITH PROPOFOL BIOPSY POLYPECTOMY  Patient location during evaluation: Phase II Anesthesia Type: General Level of consciousness: awake and alert and oriented Pain management: pain level controlled Vital Signs Assessment: post-procedure vital signs reviewed and stable Respiratory status: spontaneous breathing, nonlabored ventilation and respiratory function stable Cardiovascular status: blood pressure returned to baseline and stable Postop Assessment: no apparent nausea or vomiting Anesthetic complications: no  No notable events documented.   Last Vitals:  Vitals:   11/17/22 0930 11/17/22 1035  BP:  (!) 143/75  Pulse: 61 76  Resp: 16 14  Temp:  (!) 36.4 C  SpO2: 100% 100%    Last Pain:  Vitals:   11/17/22 1037  PainSc: 0-No pain                 Tamecka Milham C Garmon Dehn

## 2022-11-17 NOTE — Telephone Encounter (Signed)
Patient requesting cardiology referral for intermittent chest pain. She is also concerned about her pre op EKG. Can we make this referral?  Thank you

## 2022-11-17 NOTE — Anesthesia Procedure Notes (Signed)
Date/Time: 11/17/2022 10:06 AM  Performed by: Julian Reil, CRNAPre-anesthesia Checklist: Patient identified, Emergency Drugs available, Suction available and Patient being monitored Patient Re-evaluated:Patient Re-evaluated prior to induction Oxygen Delivery Method: Nasal cannula Induction Type: IV induction Placement Confirmation: positive ETCO2

## 2022-11-17 NOTE — Anesthesia Preprocedure Evaluation (Addendum)
Anesthesia Evaluation  Patient identified by MRN, date of birth, ID band Patient awake    Reviewed: Allergy & Precautions, H&P , NPO status , Patient's Chart, lab work & pertinent test results  Airway Mallampati: II  TM Distance: >3 FB Neck ROM: Full    Dental  (+) Dental Advisory Given, Missing, Partial Upper, Loose,    Pulmonary COPD,  COPD inhaler, Current Smoker and Patient abstained from smoking.   Pulmonary exam normal breath sounds clear to auscultation       Cardiovascular hypertension, Pt. on medications Normal cardiovascular exam Rhythm:Regular Rate:Normal     Neuro/Psych  PSYCHIATRIC DISORDERS   Bipolar Disorder   CVA    GI/Hepatic ,GERD  Medicated,,(+)     substance abuse (last use 6 months ago)  cocaine use, Hepatitis -, C  Endo/Other  negative endocrine ROS    Renal/GU negative Renal ROS  negative genitourinary   Musculoskeletal negative musculoskeletal ROS (+)    Abdominal   Peds negative pediatric ROS (+)  Hematology negative hematology ROS (+)   Anesthesia Other Findings Lupus?  Upper right front tooth, loose, wiggling with patient touch  Reproductive/Obstetrics negative OB ROS                             Anesthesia Physical Anesthesia Plan  ASA: 2  Anesthesia Plan: General   Post-op Pain Management: Minimal or no pain anticipated   Induction: Intravenous  PONV Risk Score and Plan: 1 and Propofol infusion  Airway Management Planned: Nasal Cannula and Natural Airway  Additional Equipment:   Intra-op Plan:   Post-operative Plan:   Informed Consent: I have reviewed the patients History and Physical, chart, labs and discussed the procedure including the risks, benefits and alternatives for the proposed anesthesia with the patient or authorized representative who has indicated his/her understanding and acceptance.     Dental advisory given  Plan Discussed  with: CRNA and Surgeon  Anesthesia Plan Comments:         Anesthesia Quick Evaluation

## 2022-11-17 NOTE — Addendum Note (Signed)
Addended by: Armstead Peaks on: 11/17/2022 11:13 AM   Modules accepted: Orders

## 2022-11-17 NOTE — Telephone Encounter (Signed)
Referral sent 

## 2022-11-17 NOTE — Transfer of Care (Signed)
Immediate Anesthesia Transfer of Care Note  Patient: Brittany Lester  Procedure(s) Performed: COLONOSCOPY WITH PROPOFOL ESOPHAGOGASTRODUODENOSCOPY (EGD) WITH PROPOFOL BIOPSY POLYPECTOMY  Patient Location: Short Stay  Anesthesia Type:General  Level of Consciousness: drowsy  Airway & Oxygen Therapy: Patient Spontanous Breathing  Post-op Assessment: Report given to RN and Post -op Vital signs reviewed and stable  Post vital signs: Reviewed and stable  Last Vitals:  Vitals Value Taken Time  BP    Temp    Pulse    Resp    SpO2      Last Pain:  Vitals:   11/17/22 1006  PainSc: 0-No pain         Complications: No notable events documented.

## 2022-11-17 NOTE — Op Note (Signed)
[facility] Patient Name: Brittany Lester Procedure Date: 11/17/2022 9:47 AM MRN: [id number] Date of Birth: 1960/07/15 Attending MD: Hennie Duos. Marletta Lor , [place], [id number] CSN: [id number] Age: 62 Admit Type: Outpatient Procedure:                Colonoscopy Indications:              Surveillance: Personal history of adenomatous                            polyps on last colonoscopy > 5 years ago Providers:                Hennie Duos. Marletta Lor, DO, Sheran Fava, Zena Amos Referring MD:              Medicines:                See the Anesthesia note for documentation of the                            administered medications Complications:            No immediate complications. Estimated Blood Loss:     Estimated blood loss was minimal. Procedure:                Pre-Anesthesia Assessment:                           - The anesthesia plan was to use monitored                            anesthesia care (MAC).                           After obtaining informed consent, the colonoscope                            was passed under direct vision. Throughout the                            procedure, the patient's blood pressure, pulse, and                            oxygen saturations were monitored continuously. The                            [id number]-HQ190L ([id number]) scope was introduced through                            the anus and advanced to the the cecum, identified                            by appendiceal orifice and ileocecal valve. The                            colonoscopy was performed without difficulty.  The                            patient tolerated the procedure well. The quality                            of the bowel preparation was evaluated using the                            BBPS [facility] Bowel Preparation Scale) with scores                            of: Right Colon = 2 (minor amount of residual                            staining, small  fragments of stool and/or opaque                            liquid, but mucosa seen well), Transverse Colon = 3                            (entire mucosa seen well with no residual staining,                            small fragments of stool or opaque liquid) and Left                            Colon = 3 (entire mucosa seen well with no residual                            staining, small fragments of stool or opaque                            liquid). The total BBPS score equals 8. The quality                            of the bowel preparation was good. Scope In: 10:14:47 AM Scope Out: 10:31:17 AM Scope Withdrawal Time: 0 hours 12 minutes 21 seconds  Total Procedure Duration: 0 hours 16 minutes 30 seconds  Findings:      Non-bleeding internal hemorrhoids were found during endoscopy.      Two sessile polyps were found in the ascending colon and cecum. The       polyps were 4 to 6 mm in size. These polyps were removed with a cold       snare. Resection and retrieval were complete.      The exam was otherwise without abnormality. Impression:               - Non-bleeding internal hemorrhoids.                           - Two 4 to 6 mm polyps in the ascending colon and  in the cecum, removed with a cold snare. Resected                            and retrieved.                           - The examination was otherwise normal. Moderate Sedation:      Per Anesthesia Care Recommendation:           - Patient has a contact number available for                            emergencies. The signs and symptoms of potential                            delayed complications were discussed with the                            patient. Return to normal activities tomorrow.                            Written discharge instructions were provided to the                            patient.                           - Resume previous diet.                           - Continue present  medications.                           - Await pathology results.                           - Repeat colonoscopy in 5 years for surveillance.                           - Return to GI clinic in 3 months. Procedure Code(s):        --- Professional ---                           [phone], Colonoscopy, flexible; with removal of                            tumor(s), polyp(s), or other lesion(s) by snare                            technique Diagnosis Code(s):        --- Professional ---                           Z86.010, Personal history of colonic polyps                           D12.2, Benign neoplasm of  ascending colon                           D12.0, Benign neoplasm of cecum                           K64.8, Other hemorrhoids CPT copyright 2022 [employer]. All rights reserved. The codes documented in this report are preliminary and upon coder review may  be revised to meet current compliance requirements. Hennie Duos. Marletta Lor, DO Hennie Duos. Marletta Lor, DO 11/17/2022 10:34:27 AM This report has been signed electronically. Number of Addenda: 0

## 2022-11-17 NOTE — Discharge Instructions (Signed)
EGD Discharge instructions Please read the instructions outlined below and refer to this sheet in the next few weeks. These discharge instructions provide you with general information on caring for yourself after you leave the hospital. Your doctor may also give you specific instructions. While your treatment has been planned according to the most current medical practices available, unavoidable complications occasionally occur. If you have any problems or questions after discharge, please call your doctor. ACTIVITY You may resume your regular activity but move at a slower pace for the next 24 hours.  Take frequent rest periods for the next 24 hours.  Walking will help expel (get rid of) the air and reduce the bloated feeling in your abdomen.  No driving for 24 hours (because of the anesthesia (medicine) used during the test).  You may shower.  Do not sign any important legal documents or operate any machinery for 24 hours (because of the anesthesia used during the test).  NUTRITION Drink plenty of fluids.  You may resume your normal diet.  Begin with a light meal and progress to your normal diet.  Avoid alcoholic beverages for 24 hours or as instructed by your caregiver.  MEDICATIONS You may resume your normal medications unless your caregiver tells you otherwise.  WHAT YOU CAN EXPECT TODAY You may experience abdominal discomfort such as a feeling of fullness or "gas" pains.  FOLLOW-UP Your doctor will discuss the results of your test with you.  SEEK IMMEDIATE MEDICAL ATTENTION IF ANY OF THE FOLLOWING OCCUR: Excessive nausea (feeling sick to your stomach) and/or vomiting.  Severe abdominal pain and distention (swelling).  Trouble swallowing.  Temperature over 101 F (37.8 C).  Rectal bleeding or vomiting of blood.    Colonoscopy Discharge Instructions  Read the instructions outlined below and refer to this sheet in the next few weeks. These discharge instructions provide you with  general information on caring for yourself after you leave the hospital. Your doctor may also give you specific instructions. While your treatment has been planned according to the most current medical practices available, unavoidable complications occasionally occur.   ACTIVITY You may resume your regular activity, but move at a slower pace for the next 24 hours.  Take frequent rest periods for the next 24 hours.  Walking will help get rid of the air and reduce the bloated feeling in your belly (abdomen).  No driving for 24 hours (because of the medicine (anesthesia) used during the test).   Do not sign any important legal documents or operate any machinery for 24 hours (because of the anesthesia used during the test).  NUTRITION Drink plenty of fluids.  You may resume your normal diet as instructed by your doctor.  Begin with a light meal and progress to your normal diet. Heavy or fried foods are harder to digest and may make you feel sick to your stomach (nauseated).  Avoid alcoholic beverages for 24 hours or as instructed.  MEDICATIONS You may resume your normal medications unless your doctor tells you otherwise.  WHAT YOU CAN EXPECT TODAY Some feelings of bloating in the abdomen.  Passage of more gas than usual.  Spotting of blood in your stool or on the toilet paper.  IF YOU HAD POLYPS REMOVED DURING THE COLONOSCOPY: No aspirin products for 7 days or as instructed.  No alcohol for 7 days or as instructed.  Eat a soft diet for the next 24 hours.  FINDING OUT THE RESULTS OF YOUR TEST Not all test results are available  during your visit. If your test results are not back during the visit, make an appointment with your caregiver to find out the results. Do not assume everything is normal if you have not heard from your caregiver or the medical facility. It is important for you to follow up on all of your test results.  SEEK IMMEDIATE MEDICAL ATTENTION IF: You have more than a spotting of  blood in your stool.  Your belly is swollen (abdominal distention).  You are nauseated or vomiting.  You have a temperature over 101.  You have abdominal pain or discomfort that is severe or gets worse throughout the day.   Your EGD revealed mild amount inflammation in your stomach.  I took biopsies of this to rule out infection with a bacteria called H. pylori.  I also took biopsies of your esophagus to screen for Barrett's esophagus.  Small bowel appeared normal.  Continue on pantoprazole daily.  Your colonoscopy revealed 2 polyp(s) which I removed successfully. Await pathology results, my office will contact you. I recommend repeating colonoscopy in 5 years for surveillance purposes.   Follow up in GI office in 2-3 months     I hope you have a great rest of your week!  Hennie Duos. Marletta Lor, D.O. Gastroenterology and Hepatology [facility]

## 2022-11-21 LAB — SURGICAL PATHOLOGY

## 2022-11-22 ENCOUNTER — Encounter (HOSPITAL_COMMUNITY): Payer: Self-pay | Admitting: Internal Medicine

## 2022-11-22 DIAGNOSIS — F319 Bipolar disorder, unspecified: Secondary | ICD-10-CM | POA: Diagnosis not present

## 2022-11-22 DIAGNOSIS — I10 Essential (primary) hypertension: Secondary | ICD-10-CM | POA: Diagnosis not present

## 2022-12-01 ENCOUNTER — Telehealth: Payer: Self-pay

## 2022-12-01 NOTE — Telephone Encounter (Signed)
FYI:  Phoned and spoke with the pt and advised of the results and instructions. Pt is willing to come in for a visit on next week and be evaluated. Pt advised that the front office will be reaching out to her to schedule a visit for her

## 2022-12-01 NOTE — Telephone Encounter (Signed)
Pt phoned regarding her EGD report (which I had sent her in her MyChart). She was inquiring that it does not tell her why she is still having abd pain and she  knows that something is wrong. I advised the pt of her report once again and that I could not tell her why she is hurting. Also asked her was she taking her PPI medications and following your instructions as you had advised and she stated yes. I advised I can only send her to the front desk for another appt to be seen. I also advised her that I will let the Dr seen last know to see what is recommended. Pt did not like the idea that I could not tell her why she was hurting. Please advise?

## 2022-12-01 NOTE — Telephone Encounter (Signed)
Reviewed procedures. She did have mild inflammation in her stomach and esophagus which can contribute to her pain, but I am not if it explains all of her symptoms. I agree, we need to get her back in the office for further evaluation. I have some openings next week. In the mean time, she can increase pantoprazole to 40 mg twice daily; take 30 min before breakfast and 30 min before dinner.   Mandy: Please reach patient to schedule OV next week OK to use follow-up or new patient slot.

## 2022-12-03 NOTE — Progress Notes (Signed)
Referring Provider: Benetta Spar* Primary Care Physician:  Benetta Spar, MD Primary GI Physician: Dr. Marletta Lor  Chief Complaint  Patient presents with   Follow-up    Follow up, still having some gas and stomach pains after she eats.      HPI:   Brittany Lester is a 62 y.o. female 62 y.o. female with history of stroke, HTN, HLD, bipolar disorder, ?Lupus, GERD, hepatitis C s/p treatment with Harvoni in 2016 achieving SVR, adenomatous colon polyps, constipation, presenting today for follow-up of abdominal pain and weight loss.  Last seen in our office 10/25/2022 reporting several month history of abdominal pain that was constantly there to some degree, but worsened postprandially regardless of what she ate.  Described her symptoms as more of a pressure rather than pain with a lot of gas that is hard to get rid of.  Tried Rolaids, Alka-Seltzer, gas pills, Pepto without improvement.  Antacids provide the most help.  No nausea, vomiting, reflux symptoms.  At baseline, bowel movements every 3 days.  No BRBPR or melena.  Reported she has been losing weight, baseline weight in the upper to mid 90s, but currently weighing 79 pounds.  Stated her health has declined overall since her stroke 2 years ago.  Stated she saw her primary care doctor 2 weeks ago and had several blood test done.  Reported she was actually eating a lot and denied any change in her intake eating with her abdominal pain.  Taking ibuprofen about once a week.  Recommended EGD, colonoscopy, Protonix 40 mg daily, MiraLAX daily, avoid common gas producing items, add 2 protein shakes daily.   Procedures completed 11/17/2022; Colonoscopy: - Non- bleeding internal hemorrhoids.  - Two 4 to 6 mm polyps in the ascending colon and in the cecum, removed with a cold snare. Resected and retrieved. -Pathology: Inflammatory polyps.  Recommended repeat colonoscopy in 5 years due to history of prior polyps. EGD:   - Esophageal  mucosal changes suggestive of short- segment Barrett' s esophagus. Biopsied.  - Gastritis. Biopsied. - Normal duodenal bulb, first portion of the duodenum and second portion of the duodenum. - Path: Nonspecific reactive gastropathy, no H. Pylori, no Barrett's esophagus.   Today:  Constant generalized abdominal pain/bloating. Worse with meals regardless of what she eats or drinks. No nausea or vomiting. No typical acid reflux symptoms, but has a lot of belching and passes a lot of gas. Seems that pantoprazole takes the edge off slightly, but not a lot. Currently taking twice daily.   Still eating well. States she eats quite a bit. No change in her eating habits.   Having bowel movements every 2-3 days. Never tried MiraLAX. Stools are soft/formed. No brbpr or melena.   Elevated LFTs: ETOH: Every few months. Couple of mixed drinks with vodka.  Drug use: None.  No OTC medications aside from Mylanta.  No herbal teas/supplements. No recent antibiotics.  Ibuprofen 2 pills BID for the last week. Ran out of tramadol. Hoping to get this soon.   Wt Readings from Last 8 Encounters:  12/06/22 78 lb (35.4 kg)  11/14/22 79 lb (35.8 kg)  10/25/22 79 lb (35.8 kg)  11/25/20 95 lb (43.1 kg)  07/12/20 97 lb (44 kg)  06/14/20 94 lb 8 oz (42.9 kg)  05/19/20 98 lb (44.5 kg)  08/09/19 100 lb (45.4 kg)      Past Medical History:  Diagnosis Date   Atrophic vaginitis 07/24/2014   Bipolar disorder (HCC)  BV (bacterial vaginosis) 07/24/2014   COPD (chronic obstructive pulmonary disease) (HCC)    Hepatitis C    Harvoni   Hepatitis C    Herpes    Hyperlipidemia    Hypertension    LGSIL of cervix of undetermined significance 06/24/2020   2/22 will get Colpo   Lupus (HCC)    ???   Medical history non-contributory    Stroke [facility])    right sided weakness   Vaginal discharge 07/24/2014    Past Surgical History:  Procedure Laterality Date   BIOPSY  11/17/2022   Procedure: BIOPSY;  Surgeon:  Lanelle Bal, DO;  Location: [facility];  Service: Endoscopy;;   c section  12/18/1993   CESAREAN SECTION     COLONOSCOPY N/A 12/24/2013   ZOX:WRUEAV mucosa in the terminal ileum/TWO colon polyps removed/ The LEFT colon IS redundant/Small internal hemorrhoids. tubular adenoma. next TCS 12/2018   COLONOSCOPY WITH PROPOFOL N/A 11/17/2022   Procedure: COLONOSCOPY WITH PROPOFOL;  Surgeon: Lanelle Bal, DO;  Location: [facility];  Service: Endoscopy;  Laterality: N/A;  10:45 am, asa 3   ESOPHAGOGASTRODUODENOSCOPY N/A 02/06/2014   SLF: 1. No obvious reason for weight loss identified 2. Dyspepsia due to MILD erosive gastritis.   ESOPHAGOGASTRODUODENOSCOPY (EGD) WITH PROPOFOL N/A 11/17/2022   Procedure: ESOPHAGOGASTRODUODENOSCOPY (EGD) WITH PROPOFOL;  Surgeon: Lanelle Bal, DO;  [facility];  Service: Endoscopy;  Laterality: N/A;   FRACTURE SURGERY Right    20 years ago   POLYPECTOMY  11/17/2022   Procedure: POLYPECTOMY;  Surgeon: Lanelle Bal, DO;  [facility]: [facility];  Service: Endoscopy;;    Current Outpatient Medications  Medication Sig Dispense Refill   albuterol (ACCUNEB) 0.63 MG/3ML nebulizer solution Take 1 ampule by nebulization every 6 (six) hours as needed for shortness of breath or wheezing.     albuterol (VENTOLIN HFA) 108 (90 Base) MCG/ACT inhaler Inhale 1-2 puffs into the lungs every 4 (four) hours as needed for wheezing or shortness of breath.     aspirin EC 81 MG tablet Take 1 tablet (81 mg total) by mouth daily. Swallow whole. 30 tablet 11   atorvastatin (LIPITOR) 40 MG tablet Take 1 tablet (40 mg total) by mouth daily. (Patient taking differently: Take 40 mg by mouth at bedtime.) 30 tablet 1   BREO ELLIPTA 100-25 MCG/INH AEPB Inhale 1 puff into the lungs in the morning.     DULoxetine (CYMBALTA) 60 MG capsule Take 60 mg by mouth at bedtime.     feeding supplement (BOOST HIGH PROTEIN) LIQD Take 237 mLs by mouth 2 (two) times daily between meals.  14220 mL 1   gabapentin (NEURONTIN) 600 MG tablet Take 600 mg by mouth 3 (three) times daily.     hydrOXYzine (ATARAX) 25 MG tablet Take 25 mg by mouth 3 (three) times daily as needed for anxiety.     lisinopril-hydrochlorothiazide (ZESTORETIC) 20-12.5 MG tablet Take 1 tablet by mouth daily. 30 tablet 1   pantoprazole (PROTONIX) 40 MG tablet Take 1 tablet (40 mg total) by mouth daily before breakfast. (Patient taking differently: Take 40 mg by mouth 2 (two) times daily.) 30 tablet 3   traMADol (ULTRAM) 50 MG tablet Take 100 mg by mouth in the morning, at noon, in the evening, and at bedtime.     No current facility-administered medications for this visit.    Allergies as of 12/06/2022 - Review Complete 12/06/2022  Allergen Reaction Noted   Erythromycin Other (See Comments)  Family History  Problem Relation Age of Onset   Colon cancer Maternal Aunt        age 72   Heart attack Mother    Thyroid disease Mother    Heart attack Father    Alcohol abuse Father    Depression Father    Heart disease Sister    Thyroid disease Sister    Bipolar disorder Sister    Cancer Sister        lung   Drug abuse Sister    Bipolar disorder Brother    Drug abuse Brother    Drug abuse Son    Liver disease Neg Hx     Social History   Socioeconomic History   Marital status: Single    Spouse name: Not on file   Number of children: 2   Years of education: Not on file   Highest education level: Not on file  Occupational History   Not on file  Tobacco Use   Smoking status: Every Day    Current packs/day: 0.25    Average packs/day: 0.3 packs/day for 40.0 years (10.0 ttl pk-yrs)    Types: Cigarettes   Smokeless tobacco: Never  Vaping Use   Vaping status: Never Used  Substance and Sexual Activity   Alcohol use: Not Currently    Comment: occasional. Previously drank beer daily. Now has mixed drinks with vodka every few months.   Drug use: No    Comment: H/O IVDU, quit 2006   Sexual  activity: Yes    Birth control/protection: Post-menopausal  Other Topics Concern   Not on file  Social History Narrative   Not on file   Social Determinants of Health   Financial Resource Strain: Low Risk  (06/14/2020)   Overall Financial Resource Strain (CARDIA)    Difficulty of Paying Living Expenses: Not very hard  Food Insecurity: Food Insecurity Present (06/14/2020)   Hunger Vital Sign    Worried About Running Out of Food in the Last Year: Sometimes true    Ran Out of Food in the Last Year: Sometimes true  Transportation Needs: Unmet Transportation Needs (06/14/2020)   PRAPARE - Transportation    Lack of Transportation (Medical): Yes    Lack of Transportation (Non-Medical): Yes  Physical Activity: Inactive (06/14/2020)   Exercise Vital Sign    Days of Exercise per Week: 0 days    Minutes of Exercise per Session: 0 min  Stress: Stress Concern Present (06/14/2020)   [employer] of Occupational Health - Occupational Stress Questionnaire    Feeling of Stress : Rather much  Social Connections: Socially Isolated (06/14/2020)   Social Connection and Isolation Panel [NHANES]    Frequency of Communication with Friends and Family: More than three times a week    Frequency of Social Gatherings with Friends and Family: Once a week    Attends Religious Services: Never    Database administrator or Organizations: No    Attends Engineer, structural: Never    Marital Status: Divorced    Review of Systems: Gen: Denies fever, chills, cold or flulike symptoms, presyncope, syncope. CV: Denies chest pain, palpitations. Resp: Denies dyspnea, cough. GI: See HPI  Heme: See HPI  Physical Exam: BP 127/83 (BP Location: Right Arm, Patient Position: Sitting, Cuff Size: Small)   Pulse 95   Temp (!) 97.5 F (36.4 C) (Temporal)   Ht 5\' 2"  (1.575 m)   Wt 78 lb (35.4 kg)   SpO2 97%   BMI 14.27 kg/m  General:   Alert and oriented. No distress noted. Pleasant and cooperative.  Head:   Normocephalic and atraumatic. Eyes:  Conjuctiva clear without scleral icterus. Heart:  S1, S2 present without murmurs appreciated. Lungs:  Clear to auscultation bilaterally. No wheezes, rales, or rhonchi. No distress.  Abdomen:  +BS, soft, non-tender and non-distended. No rebound or guarding. No HSM or masses noted. Msk:  Symmetrical without gross deformities. Normal posture. Extremities:  Without edema. Neurologic:  Alert and  oriented x4 Psych:  Normal mood and affect.    Assessment:  62 y.o. female with history of stroke, HTN, HLD, bipolar disorder, ?Lupus, GERD, hepatitis C s/p treatment with Harvoni in 2016 achieving SVR, adenomatous colon polyps, constipation, presenting today for follow-up of abdominal pain and weight loss.  Also discussed elevated LFTs.  Generalized abdominal pain/bloating/weight loss: Several month history of postprandial abdominal pain/bloating with everything she eats and drinks.  Associated belching and flatulence.  I do note that patient has lost close to 20 pounds in the last 2 years.  She denies any change in dietary intake. Denies nausea, vomiting, GERD symptoms on pantoprazole twice daily.  BMs are at baseline every couple of days with soft stool.  She has recently been taking ibuprofen twice daily for the last week as she ran out of tramadol, but had not previously been taking ibuprofen.  Recent colonoscopy and EGD earlier this month with inflammatory polyps removed from the colon, nonspecific reactive gastropathy.  Recent labs 7/9 with mildly elevated LFTs (AST 65, ALT 51) which had previously been normal. No recent abdominal imaging. Abdominal exam is completely benign.   At this point, etiology of symptoms is not clear.  Will screen for celiac disease, check thyroid function, obtain CT A/P angiography to evaluate for chronic mesenteric ischemia.  This will also help to take a look at her liver and biliary system in light of elevated LFTs.  Advised to go ahead  and start MiraLAX daily to see if having daily bowel movements would help her symptoms.  If workup is negative, and symptoms continue, can consider empiric treatment for SIBO.  Elevated LFTs: Mild elevation with AST 65, ALT 51 on 11/14/2022, previously within normal limits.  She does have history of hep C, but achieved SVR after treatment in 2016.  Liver appeared normal on CT in 2015.  No symptoms of decompensated liver disease.  She denies drug use, over-the-counter medications aside from Mylanta, herbal teas/supplements, recent antibiotics.  She does drink alcohol every few months.  Will plan to recheck LFTs as well as hepatitis labs.  If persistent elevation hepatitis labs are negative, consider additional workup.  CT has been ordered to evaluate her abdominal pain will also serve to evaluate her liver/biliary system.   Plan:  TSH, IgA, TTG IgA, hepatitis B surface antibody, hepatitis B surface antigen, hepatitis B core antibody total, hepatitis A antibody, HFP. CT angio A/P. Continue pantoprazole 40 mg twice daily. Start MiraLAX 17 g daily. Avoid common gas producing items including broccoli, cabbage, cauliflower, beans, Brussels sprouts, artificial sweeteners, drinking through a straw, carbonated beverages, hard candy, chewing gum. Prescription of boost was sent to pharmacy per patient's request. Avoid NSAIDs.  Further recommendations to follow.   Ermalinda Memos, PA-C [facility] 12/06/2022

## 2022-12-04 NOTE — Telephone Encounter (Signed)
noted 

## 2022-12-06 ENCOUNTER — Ambulatory Visit (INDEPENDENT_AMBULATORY_CARE_PROVIDER_SITE_OTHER): Payer: Medicare HMO | Admitting: Gastroenterology

## 2022-12-06 ENCOUNTER — Encounter: Payer: Self-pay | Admitting: Gastroenterology

## 2022-12-06 ENCOUNTER — Telehealth: Payer: Self-pay | Admitting: *Deleted

## 2022-12-06 ENCOUNTER — Encounter: Payer: Self-pay | Admitting: *Deleted

## 2022-12-06 VITALS — BP 127/83 | HR 95 | Temp 97.5°F | Ht 62.0 in | Wt 78.0 lb

## 2022-12-06 DIAGNOSIS — R140 Abdominal distension (gaseous): Secondary | ICD-10-CM

## 2022-12-06 DIAGNOSIS — R634 Abnormal weight loss: Secondary | ICD-10-CM | POA: Diagnosis not present

## 2022-12-06 DIAGNOSIS — R1084 Generalized abdominal pain: Secondary | ICD-10-CM

## 2022-12-06 DIAGNOSIS — R7989 Other specified abnormal findings of blood chemistry: Secondary | ICD-10-CM

## 2022-12-06 MED ORDER — BOOST HIGH PROTEIN PO LIQD
1.0000 | Freq: Two times a day (BID) | ORAL | 1 refills | Status: DC
Start: 2022-12-06 — End: 2023-06-20

## 2022-12-06 NOTE — Telephone Encounter (Signed)
[employer] PA: Approved Authorization #[id number]  Tracking #[id number] Dates of service 12/06/2022 - 02/04/2023

## 2022-12-06 NOTE — Patient Instructions (Signed)
Please have blood work completed at [facility].  We will arrange you to have a CT of your abdomen and pelvis at [facility].  Continue taking pantoprazole 40 mg twice daily 30 minutes before breakfast and dinner.  Start MiraLAX 1 capful (17 g) daily in 8 ounces of water to see if this will help your bowels move better and help your abdominal discomfort.  Avoid common gas producing items including broccoli, cabbage, cauliflower, beans, Brussels sprouts, artificial sweeteners, drinking through a straw, carbonated beverages, hard candy, chewing gum.  I have sent a prescription of boost to your pharmacy as you requested.  We will see if this is covered.  I will have further recommendations for you pending your labs and imaging.  It was a pleasure to see you today! I want to create trusting relationships with patients. If you receive a survey regarding your visit,  I greatly appreciate you taking time to fill this out on paper or through your MyChart. I value your feedback.  Ermalinda Memos, PA-C [facility]

## 2022-12-06 NOTE — Telephone Encounter (Signed)
LMOVM to return call regarding CT appointment also advised would send MyChart message.  CT scheduled for Friday 12/08/22, arrive at 11:15 am to check in at [facility].

## 2022-12-08 ENCOUNTER — Ambulatory Visit (HOSPITAL_COMMUNITY): Payer: Medicare HMO

## 2022-12-10 ENCOUNTER — Encounter: Payer: Self-pay | Admitting: Gastroenterology

## 2022-12-15 ENCOUNTER — Ambulatory Visit (HOSPITAL_COMMUNITY)
Admission: RE | Admit: 2022-12-15 | Discharge: 2022-12-15 | Disposition: A | Discharge status: Home / Self Care | Payer: Medicare HMO | Source: Ambulatory Visit | Attending: Gastroenterology | Admitting: Gastroenterology

## 2022-12-15 DIAGNOSIS — R140 Abdominal distension (gaseous): Secondary | ICD-10-CM | POA: Insufficient documentation

## 2022-12-15 DIAGNOSIS — R634 Abnormal weight loss: Secondary | ICD-10-CM | POA: Diagnosis not present

## 2022-12-15 DIAGNOSIS — I700 Atherosclerosis of aorta: Secondary | ICD-10-CM | POA: Diagnosis not present

## 2022-12-15 DIAGNOSIS — R7989 Other specified abnormal findings of blood chemistry: Secondary | ICD-10-CM | POA: Insufficient documentation

## 2022-12-15 DIAGNOSIS — R1084 Generalized abdominal pain: Secondary | ICD-10-CM | POA: Diagnosis not present

## 2022-12-15 DIAGNOSIS — K37 Unspecified appendicitis: Secondary | ICD-10-CM | POA: Diagnosis not present

## 2022-12-15 MED ORDER — IOHEXOL 350 MG/ML SOLN
75.0000 mL | Freq: Once | INTRAVENOUS | Status: AC | PRN
  Administered 2022-12-15: 75 mL via INTRAVENOUS

## 2022-12-21 ENCOUNTER — Encounter: Payer: Self-pay | Admitting: Internal Medicine

## 2022-12-22 DIAGNOSIS — R634 Abnormal weight loss: Secondary | ICD-10-CM | POA: Diagnosis not present

## 2022-12-22 DIAGNOSIS — R7989 Other specified abnormal findings of blood chemistry: Secondary | ICD-10-CM | POA: Diagnosis not present

## 2022-12-22 DIAGNOSIS — R140 Abdominal distension (gaseous): Secondary | ICD-10-CM | POA: Diagnosis not present

## 2022-12-23 DIAGNOSIS — F319 Bipolar disorder, unspecified: Secondary | ICD-10-CM | POA: Diagnosis not present

## 2022-12-23 DIAGNOSIS — I10 Essential (primary) hypertension: Secondary | ICD-10-CM | POA: Diagnosis not present

## 2022-12-26 LAB — HEPATITIS B CORE ANTIBODY, TOTAL: Hep B Core Total Ab: NONREACTIVE

## 2022-12-26 LAB — HEPATITIS C RNA QUANTITATIVE: HCV RNA, PCR, QN: <15 IU/mL

## 2023-01-10 ENCOUNTER — Ambulatory Visit: Payer: Medicare HMO | Admitting: Gastroenterology

## 2023-01-21 ENCOUNTER — Other Ambulatory Visit: Payer: Self-pay | Admitting: Gastroenterology

## 2023-01-21 DIAGNOSIS — R140 Abdominal distension (gaseous): Secondary | ICD-10-CM

## 2023-01-21 DIAGNOSIS — R1084 Generalized abdominal pain: Secondary | ICD-10-CM

## 2023-01-23 DIAGNOSIS — F319 Bipolar disorder, unspecified: Secondary | ICD-10-CM | POA: Diagnosis not present

## 2023-01-23 DIAGNOSIS — I10 Essential (primary) hypertension: Secondary | ICD-10-CM | POA: Diagnosis not present

## 2023-01-24 ENCOUNTER — Other Ambulatory Visit: Payer: Self-pay | Admitting: Gastroenterology

## 2023-01-24 DIAGNOSIS — R1084 Generalized abdominal pain: Secondary | ICD-10-CM

## 2023-01-24 DIAGNOSIS — R140 Abdominal distension (gaseous): Secondary | ICD-10-CM

## 2023-01-31 ENCOUNTER — Encounter: Payer: Self-pay | Admitting: Internal Medicine

## 2023-01-31 ENCOUNTER — Ambulatory Visit: Payer: Medicare HMO | Attending: Internal Medicine | Admitting: Internal Medicine

## 2023-01-31 NOTE — Progress Notes (Signed)
Erroneous encounter - please disregard.

## 2023-02-22 DIAGNOSIS — I10 Essential (primary) hypertension: Secondary | ICD-10-CM | POA: Diagnosis not present

## 2023-02-22 DIAGNOSIS — F319 Bipolar disorder, unspecified: Secondary | ICD-10-CM | POA: Diagnosis not present

## 2023-03-12 ENCOUNTER — Other Ambulatory Visit: Payer: Self-pay | Admitting: Gastroenterology

## 2023-03-12 DIAGNOSIS — R140 Abdominal distension (gaseous): Secondary | ICD-10-CM

## 2023-03-12 DIAGNOSIS — R1084 Generalized abdominal pain: Secondary | ICD-10-CM

## 2023-03-15 DIAGNOSIS — G890 Central pain syndrome: Secondary | ICD-10-CM | POA: Diagnosis not present

## 2023-03-15 DIAGNOSIS — I10 Essential (primary) hypertension: Secondary | ICD-10-CM | POA: Diagnosis not present

## 2023-03-15 DIAGNOSIS — I6930 Unspecified sequelae of cerebral infarction: Secondary | ICD-10-CM | POA: Diagnosis not present

## 2023-04-04 DIAGNOSIS — J449 Chronic obstructive pulmonary disease, unspecified: Secondary | ICD-10-CM | POA: Diagnosis not present

## 2023-04-04 DIAGNOSIS — F17200 Nicotine dependence, unspecified, uncomplicated: Secondary | ICD-10-CM | POA: Diagnosis not present

## 2023-04-04 DIAGNOSIS — E440 Moderate protein-calorie malnutrition: Secondary | ICD-10-CM | POA: Diagnosis not present

## 2023-04-04 DIAGNOSIS — E43 Unspecified severe protein-calorie malnutrition: Secondary | ICD-10-CM | POA: Diagnosis not present

## 2023-04-04 DIAGNOSIS — F17210 Nicotine dependence, cigarettes, uncomplicated: Secondary | ICD-10-CM | POA: Diagnosis not present

## 2023-04-04 DIAGNOSIS — Z23 Encounter for immunization: Secondary | ICD-10-CM | POA: Diagnosis not present

## 2023-04-04 DIAGNOSIS — F319 Bipolar disorder, unspecified: Secondary | ICD-10-CM | POA: Diagnosis not present

## 2023-05-04 DIAGNOSIS — I10 Essential (primary) hypertension: Secondary | ICD-10-CM | POA: Diagnosis not present

## 2023-05-04 DIAGNOSIS — F319 Bipolar disorder, unspecified: Secondary | ICD-10-CM | POA: Diagnosis not present

## 2023-06-04 DIAGNOSIS — F319 Bipolar disorder, unspecified: Secondary | ICD-10-CM | POA: Diagnosis not present

## 2023-06-04 DIAGNOSIS — I10 Essential (primary) hypertension: Secondary | ICD-10-CM | POA: Diagnosis not present

## 2023-06-13 DIAGNOSIS — J449 Chronic obstructive pulmonary disease, unspecified: Secondary | ICD-10-CM | POA: Diagnosis not present

## 2023-06-20 ENCOUNTER — Encounter: Payer: Self-pay | Admitting: Adult Health

## 2023-06-20 ENCOUNTER — Ambulatory Visit: Payer: 59 | Admitting: Adult Health

## 2023-06-20 ENCOUNTER — Other Ambulatory Visit (HOSPITAL_COMMUNITY)
Admission: RE | Admit: 2023-06-20 | Discharge: 2023-06-20 | Disposition: A | Discharge status: Home / Self Care | Payer: 59 | Source: Ambulatory Visit | Attending: Adult Health | Admitting: Adult Health

## 2023-06-20 VITALS — BP 120/81 | HR 77 | Ht 62.0 in | Wt 83.0 lb

## 2023-06-20 DIAGNOSIS — Z0000 Encounter for general adult medical examination without abnormal findings: Secondary | ICD-10-CM | POA: Diagnosis not present

## 2023-06-20 DIAGNOSIS — F17200 Nicotine dependence, unspecified, uncomplicated: Secondary | ICD-10-CM | POA: Diagnosis not present

## 2023-06-20 DIAGNOSIS — Z1151 Encounter for screening for human papillomavirus (HPV): Secondary | ICD-10-CM | POA: Insufficient documentation

## 2023-06-20 DIAGNOSIS — Z1331 Encounter for screening for depression: Secondary | ICD-10-CM | POA: Diagnosis not present

## 2023-06-20 DIAGNOSIS — Z8742 Personal history of other diseases of the female genital tract: Secondary | ICD-10-CM | POA: Diagnosis not present

## 2023-06-20 DIAGNOSIS — Z01419 Encounter for gynecological examination (general) (routine) without abnormal findings: Secondary | ICD-10-CM | POA: Insufficient documentation

## 2023-06-20 DIAGNOSIS — Z1211 Encounter for screening for malignant neoplasm of colon: Secondary | ICD-10-CM | POA: Diagnosis not present

## 2023-06-20 DIAGNOSIS — R0989 Other specified symptoms and signs involving the circulatory and respiratory systems: Secondary | ICD-10-CM | POA: Insufficient documentation

## 2023-06-20 LAB — HEMOCCULT GUIAC POC 1CARD (OFFICE): Fecal Occult Blood, POC: NEGATIVE

## 2023-06-20 NOTE — Progress Notes (Signed)
Patient ID: Brittany Lester, female   DOB: 1960-07-02, 63 y.o.   MRN: [id number] History of Present Illness: Brittany Lester is a 63 year old white female, with SO, PM in for a well woman gyn exam and pap.  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  PCP is Dr Felecia Shelling.   Current Medications, Allergies, Past Medical History, Past Surgical History, Family History and Social History were reviewed in [employer] record.     Review of Systems: Patient denies any headaches, hearing loss, fatigue, blurred vision,  abdominal pain, problems with bowel movements, urination, or intercourse. No joint pain or mood swings.  Denies any vaginal bleeding  She has had chest pain and shortness of breath, at times and has appointment to see cardiologist 08/06/23 She is a smoker but says she is not smoking that much.   Physical Exam:BP 120/81 (BP Location: Left Arm, Patient Position: Sitting, Cuff Size: Normal)   Pulse 77   Ht 5\' 2"  (1.575 m)   Wt 83 lb (37.6 kg)   BMI 15.18 kg/m   General:  Well developed, well nourished, no acute distress Skin:  Warm and dry Neck:  Midline trachea, normal thyroid, good ROM, no lymphadenopathy,no carotid bruits heard Lungs: +rhonchi  bilaterally noted, she says she has congestion but can't cough up Breast:  No dominant palpable mass, retraction, or nipple discharge Cardiovascular: Regular rate and rhythm Abdomen:  Soft, non tender, no hepatosplenomegaly Pelvic:  External genitalia is normal in appearance, no lesions.  The vagina is pale. Urethra has no lesions or masses. The cervix is smooth, pap with GC/CHL and HR HPV genotyping performed.  Uterus is felt to be normal size, shape, and contour.  No adnexal masses or tenderness noted.Bladder is non tender, no masses felt. Rectal: Good sphincter tone, no polyps, or hemorrhoids felt.  Hemoccult  negative. Extremities/musculoskeletal:  No swelling or varicosities noted, no clubbing or cyanosis Psych:  No mood changes, alert and cooperative,seems happy AA is 0 Fall risk is moderate    06/20/2023   10:17 AM 06/14/2020    3:10 PM 05/16/2016    3:44 PM  Depression screen PHQ 2/9  Decreased Interest 0 3 0  Down, Depressed, Hopeless 0 3 0  PHQ - 2 Score 0 6 0  Altered sleeping 2 2   Tired, decreased energy 2 3   Change in appetite 0 0   Feeling bad or failure about yourself  0 1   Trouble concentrating 2 1   Moving slowly or fidgety/restless 0 0   Suicidal thoughts 0 0   PHQ-9 Score 6 13    She is on meds     06/20/2023   10:17 AM 06/14/2020    3:10 PM  GAD 7 : Generalized Anxiety Score  Nervous, Anxious, on Edge 0 1  Control/stop worrying 1 2  Worry too much - different things 1 2  Trouble relaxing 0 1  Restless 0 0  Easily annoyed or irritable 0 2  Afraid - awful might happen 0 0  Total GAD 7 Score 2 8      Upstream - 06/20/23 1021       Pregnancy Intention Screening   Does the patient want to become pregnant in the next year? N/A    Does the patient's partner want to become pregnant in the next year? N/A  Would the patient like to discuss contraceptive options today? N/A      Contraception Wrap Up   Current Method No Method - Other Reason   PM   End Method No Method - Other Reason   PM   Contraception Counseling Provided No             Examination chaperoned by Malachy Mood LPN  Impression and plan: 1. Encounter for gynecological examination with Papanicolaou smear of cervix (Primary) Pap sent Physical in 1 year Next pap TBD Mammogram was negative 09/27/22 Colonoscopy per GI Labs with PCP - Cytology - PAP( [place])  2. Routine general medical examination at a health care facility Pap sent  - Cytology - PAP( [place])  3. History of abnormal cervical Pap smear Pap sent   4. Encounter for screening fecal occult blood testing Hemoccult was  negative  - POCT occult blood stool  5. Rhonchi Follow up with PCP  Has cardiology appt 08/06/23  6. Smoker Try to continue to decrease

## 2023-06-26 LAB — CYTOLOGY - PAP
Chlamydia: NEGATIVE
Comment: NEGATIVE
Comment: NEGATIVE
Comment: NORMAL
Diagnosis: NEGATIVE
High risk HPV: NEGATIVE
Neisseria Gonorrhea: NEGATIVE

## 2023-07-04 DIAGNOSIS — I10 Essential (primary) hypertension: Secondary | ICD-10-CM | POA: Diagnosis not present

## 2023-07-11 DIAGNOSIS — J449 Chronic obstructive pulmonary disease, unspecified: Secondary | ICD-10-CM | POA: Diagnosis not present

## 2023-07-18 DIAGNOSIS — I10 Essential (primary) hypertension: Secondary | ICD-10-CM | POA: Diagnosis not present

## 2023-07-18 DIAGNOSIS — G890 Central pain syndrome: Secondary | ICD-10-CM | POA: Diagnosis not present

## 2023-07-18 DIAGNOSIS — I6930 Unspecified sequelae of cerebral infarction: Secondary | ICD-10-CM | POA: Diagnosis not present

## 2023-08-01 DIAGNOSIS — I10 Essential (primary) hypertension: Secondary | ICD-10-CM | POA: Diagnosis not present

## 2023-08-02 ENCOUNTER — Other Ambulatory Visit: Payer: Self-pay | Admitting: Gastroenterology

## 2023-08-02 DIAGNOSIS — R1084 Generalized abdominal pain: Secondary | ICD-10-CM

## 2023-08-02 DIAGNOSIS — R140 Abdominal distension (gaseous): Secondary | ICD-10-CM

## 2023-08-02 NOTE — Telephone Encounter (Signed)
 Can we find out if she is still taking pantoprazole? Says it was discontinued on 06/20/23 by Malachy Mood, LPN. Not sure why.

## 2023-08-06 ENCOUNTER — Ambulatory Visit: Payer: Medicare HMO | Attending: Internal Medicine | Admitting: Internal Medicine

## 2023-08-06 ENCOUNTER — Encounter: Payer: Self-pay | Admitting: Internal Medicine

## 2023-08-06 VITALS — BP 116/72 | HR 89 | Ht 62.0 in | Wt 80.0 lb

## 2023-08-06 DIAGNOSIS — R079 Chest pain, unspecified: Secondary | ICD-10-CM | POA: Diagnosis not present

## 2023-08-06 DIAGNOSIS — I63532 Cerebral infarction due to unspecified occlusion or stenosis of left posterior cerebral artery: Secondary | ICD-10-CM | POA: Diagnosis not present

## 2023-08-06 DIAGNOSIS — R0609 Other forms of dyspnea: Secondary | ICD-10-CM | POA: Diagnosis not present

## 2023-08-06 DIAGNOSIS — I10 Essential (primary) hypertension: Secondary | ICD-10-CM | POA: Diagnosis not present

## 2023-08-06 MED ORDER — METOPROLOL TARTRATE 100 MG PO TABS: ORAL_TABLET | ORAL | 0 refills | Status: DC

## 2023-08-06 NOTE — Progress Notes (Signed)
 Cardiology Office Note  Date: 08/06/2023   ID: Brittany Lester, DOB 04-20-1961, MRN [id number]  PCP:  Benetta Spar, MD  Cardiologist:  None Electrophysiologist:  None   History of Present Illness: Brittany Lester is a 63 y.o. female known to have HTN, HLD was referred to cardiology clinic for evaluation of chest pain.  Ongoing chest pain x couple of years, occurring at rest mostly, 1-2 times per week, starts substernally and radiates to her left armpit, last for few minutes and resolves with rest.  She also has DOE for the last 1 year, not able to do daily activities due to shortness of breath.  No leg swelling.  She gets dizzy sometimes but no exertional dizziness.  No syncope.  Past Medical History:  Diagnosis Date   Atrophic vaginitis 07/24/2014   Bipolar disorder (HCC)    BV (bacterial vaginosis) 07/24/2014   COPD (chronic obstructive pulmonary disease) (HCC)    Hepatitis C    Harvoni   Hepatitis C    Herpes    Hyperlipidemia    Hypertension    LGSIL of cervix of undetermined significance 06/24/2020   2/22 will get Colpo   Lupus    ???   Medical history non-contributory    Stroke [facility])    right sided weakness   Vaginal discharge 07/24/2014    Past Surgical History:  Procedure Laterality Date   BIOPSY  11/17/2022   Procedure: BIOPSY;  Surgeon: Lanelle Bal, DO;  [facility];  Service: Endoscopy;;   c section  12/18/1993   CESAREAN SECTION     COLONOSCOPY N/A 12/24/2013   NWG:NFAOZH mucosa in the terminal ileum/TWO colon polyps removed/ The LEFT colon IS redundant/Small internal hemorrhoids. tubular adenoma. next TCS 12/2018   COLONOSCOPY WITH PROPOFOL N/A 11/17/2022   Procedure: COLONOSCOPY WITH PROPOFOL;  Surgeon: Lanelle Bal, DO;  [facility];  Service: Endoscopy;  Laterality: N/A;  10:45 am, asa 3   ESOPHAGOGASTRODUODENOSCOPY N/A 02/06/2014   SLF: 1. No obvious reason for weight loss identified 2. Dyspepsia due to MILD  erosive gastritis.   ESOPHAGOGASTRODUODENOSCOPY (EGD) WITH PROPOFOL N/A 11/17/2022   Procedure: ESOPHAGOGASTRODUODENOSCOPY (EGD) WITH PROPOFOL;  Surgeon: Lanelle Bal, DO;  [facility];  Service: Endoscopy;  Laterality: N/A;   FRACTURE SURGERY Right    20 years ago   POLYPECTOMY  11/17/2022   Procedure: POLYPECTOMY;  Surgeon: Lanelle Bal, DO;  [facility];  Service: Endoscopy;;    Current Outpatient Medications  Medication Sig Dispense Refill   albuterol (ACCUNEB) 0.63 MG/3ML nebulizer solution Take 1 ampule by nebulization every 6 (six) hours as needed for shortness of breath or wheezing.     albuterol (VENTOLIN HFA) 108 (90 Base) MCG/ACT inhaler Inhale 1-2 puffs into the lungs every 4 (four) hours as needed for wheezing or shortness of breath.     aspirin EC 81 MG tablet Take 1 tablet (81 mg total) by mouth daily. Swallow whole. 30 tablet 11   atorvastatin (LIPITOR) 40 MG tablet Take 1 tablet (40 mg total) by mouth daily. (Patient taking differently: Take 40 mg by mouth at bedtime.) 30 tablet 1   BREO ELLIPTA 100-25 MCG/INH AEPB Inhale 1 puff into the lungs in the morning.     DULoxetine (CYMBALTA) 60 MG capsule Take 60 mg by mouth at bedtime.     gabapentin (NEURONTIN) 600 MG tablet Take 600 mg by mouth 3 (three) times daily.     hydrOXYzine (ATARAX)  25 MG tablet Take 25 mg by mouth 3 (three) times daily as needed for anxiety.     lisinopril-hydrochlorothiazide (ZESTORETIC) 20-12.5 MG tablet Take 1 tablet by mouth daily. 30 tablet 1   traMADol (ULTRAM) 50 MG tablet Take 100 mg by mouth in the morning, at noon, in the evening, and at bedtime.     No current facility-administered medications for this visit.   Allergies:  Erythromycin   Social History: The patient  reports that she has been smoking cigarettes. She has a 10 pack-year smoking history. She has never used smokeless tobacco. She reports that she does not currently use alcohol. She reports that  she does not use drugs.   Family History: The patient's family history includes Alcohol abuse in her father; Bipolar disorder in her brother and sister; Cancer in her sister; Colon cancer in her maternal aunt; Depression in her father; Drug abuse in her brother, sister, and son; Heart attack in her father and mother; Heart disease in her sister; Thyroid disease in her mother and sister.   ROS:  Please see the history of present illness. Otherwise, complete review of systems is positive for none.  All other systems are reviewed and negative.   Physical Exam: VS:  BP 116/72   Pulse 89   Ht 5\' 2"  (1.575 m)   Wt 80 lb (36.3 kg)   SpO2 98%   BMI 14.63 kg/m , BMI Body mass index is 14.63 kg/m.  Wt Readings from Last 3 Encounters:  08/06/23 80 lb (36.3 kg)  06/20/23 83 lb (37.6 kg)  12/06/22 78 lb (35.4 kg)    General: Patient appears comfortable at rest. HEENT: Conjunctiva and lids normal, oropharynx clear with moist mucosa. Neck: Supple, no elevated JVP or carotid bruits, no thyromegaly. Lungs: Clear to auscultation, nonlabored breathing at rest. Cardiac: Regular rate and rhythm, no S3 or significant systolic murmur, no pericardial rub. Abdomen: Soft, nontender, no hepatomegaly, bowel sounds present, no guarding or rebound. Extremities: No pitting edema, distal pulses 2+. Skin: Warm and dry. Musculoskeletal: No kyphosis. Neuropsychiatric: Alert and oriented x3, affect grossly appropriate.  Recent Labwork: 11/14/2022: BUN 29; Creatinine, Ser 0.93; Hemoglobin 13.1; Platelets 210; Potassium 3.6; Sodium 136 12/22/2022: ALT 17; AST 24; TSH 3.71     Component Value Date/Time   CHOL 274 (H) 11/26/2020 0428   TRIG 107 11/26/2020 0428   HDL 79 11/26/2020 0428   CHOLHDL 3.5 11/26/2020 0428   VLDL 21 11/26/2020 0428   LDLCALC 174 (H) 11/26/2020 0428     Assessment and Plan:  Chest pain, DOE: Ongoing chest pain x 2 years, occurring mostly at rest, 1-2 times per week, lasting for few  minutes and resolving with rest.  She also has DOE for the last 1 year.  Will obtain CT cardiac and echocardiogram.  HTN, controlled: Continue current antihypertensives, lisinopril-HCTZ 20-12.5 mg once daily.  History of CVA: Continue current cardioprotective medications, aspirin 81 mg once daily and atorvastatin 40 mg nightly.  HLD, unknown values: Continue atorvastatin 40 mg nightly.  Goal LDL less than 70.  Can follow-up with PCP.    Medication Adjustments/Labs and Tests Ordered: Current medicines are reviewed at length with the patient today.  Concerns regarding medicines are outlined above.    Disposition:  Follow up pending results.  Signed, Terryann Verbeek Verne Spurr, MD, 08/06/2023 1:29 PM    [facility] at [facility] [address] [place], [place] [address]

## 2023-08-06 NOTE — Patient Instructions (Addendum)
 Medication Instructions:  Your physician recommends that you continue on your current medications as directed. Please refer to the Current Medication list given to you today.  *If you need a refill on your cardiac medications before your next appointment, please call your pharmacy*  Lab Work: BMET CBC  If you have labs (blood work) drawn today and your tests are completely normal, you will receive your results only by: MyChart Message (if you have MyChart) OR A paper copy in the mail If you have any lab test that is abnormal or we need to change your treatment, we will call you to review the results.  Testing/Procedures: Cardiac CT  Your physician has requested that you have an echocardiogram. Echocardiography is a painless test that uses sound waves to create images of your heart. It provides your doctor with information about the size and shape of your heart and how well your heart's chambers and valves are working. This procedure takes approximately one hour. There are no restrictions for this procedure. Please do NOT wear cologne, perfume, aftershave, or lotions (deodorant is allowed). Please arrive 15 minutes prior to your appointment time.  Please note: We ask at that you not bring children with you during ultrasound (echo/ vascular) testing. Due to room size and safety concerns, children are not allowed in the ultrasound rooms during exams. Our front office staff cannot provide observation of children in our lobby area while testing is being conducted. An adult accompanying a patient to their appointment will only be allowed in the ultrasound room at the discretion of the ultrasound technician under special circumstances. We apologize for any inconvenience.   Follow-Up: At [facility], you and your health needs are our priority.  As part of our continuing mission to provide you with exceptional heart care, our providers are all part of one team.  This team includes your  primary Cardiologist (physician) and Advanced Practice Providers or APPs (Physician Assistants and Nurse Practitioners) who all work together to provide you with the care you need, when you need it.  Your next appointment:    To Be Determined  Provider:   You may see Luane School, MD or one of the following Advanced Practice Providers on your designated Care Team:   [place], PA-C  [place], [place] Jacolyn Reedy, [place]     We recommend signing up for the patient portal called "MyChart".  Sign up information is provided on this After Visit Summary.  MyChart is used to connect with patients for Virtual Visits (Telemedicine).  Patients are able to view lab/test results, encounter notes, upcoming appointments, etc.  Non-urgent messages can be sent to your provider as well.   To learn more about what you can do with MyChart, go to [email or web address].   Other Instructions     Your cardiac CT will be scheduled at one of the below locations:   [facility] [address] [place], [place] [address] [phone]   If scheduled at [facility], please arrive at the [facility] and Children's Entrance (Entrance C2) of [facility] 30 minutes prior to test start time. You can use the FREE valet parking offered at entrance C (encouraged to control the heart rate for the test)  Proceed to the [facility] Radiology Department (first floor) to check-in and test prep.  All radiology patients and guests should use entrance C2 at [facility], accessed from [facility], even though the hospital's physical address listed is [address]  [employer].     Please follow these instructions carefully (unless otherwise directed):   On the Night Before the Test: Be sure to Drink plenty of water. Do not consume any caffeinated/decaffeinated beverages or chocolate 12 hours prior to your test. Do not take any antihistamines 12 hours prior to  your test.  On the Day of the Test: Drink plenty of water until 1 hour prior to the test. Do not eat any food 1 hour prior to test. You may take your regular medications prior to the test.  Take metoprolol (Lopressor) two hours prior to test. If you take Furosemide/Hydrochlorothiazide/Spironolactone/Chlorthalidone, please HOLD on the morning of the test. Patients who wear a continuous glucose monitor MUST remove the device prior to scanning. FEMALES- please wear underwire-free bra if available, avoid dresses & tight clothing        After the Test: Drink plenty of water. After receiving IV contrast, you may experience a mild flushed feeling. This is normal. On occasion, you may experience a mild rash up to 24 hours after the test. This is not dangerous. If this occurs, you can take Benadryl 25 mg, Zyrtec, Claritin, or Allegra and increase your fluid intake. (Patients taking Tikosyn should avoid Benadryl, and may take Zyrtec, Claritin, or Allegra) If you experience trouble breathing, this can be serious. If it is severe call 911 IMMEDIATELY. If it is mild, please call our office.  We will call to schedule your test 2-4 weeks out understanding that some insurance companies will need an authorization prior to the service being performed.   For more information and frequently asked questions, please visit our website : [email or web address]  For non-scheduling related questions, please contact the cardiac imaging nurse navigator should you have any questions/concerns: Cardiac Imaging Nurse Navigators Direct Office Dial: [phone]   For scheduling needs, including cancellations and rescheduling, please call [place], [phone].

## 2023-08-08 NOTE — Telephone Encounter (Signed)
 Brittany Lester, please see prior note. We need to find out if patient is still taking pantoprazole.

## 2023-08-10 ENCOUNTER — Encounter: Payer: Self-pay | Admitting: *Deleted

## 2023-08-10 NOTE — Telephone Encounter (Signed)
LMOM for pt to call office. Also sent a MyChart message.

## 2023-08-10 NOTE — Telephone Encounter (Signed)
Not taking it.

## 2023-08-11 DIAGNOSIS — J449 Chronic obstructive pulmonary disease, unspecified: Secondary | ICD-10-CM | POA: Diagnosis not present

## 2023-09-01 DIAGNOSIS — I10 Essential (primary) hypertension: Secondary | ICD-10-CM | POA: Diagnosis not present

## 2023-09-06 ENCOUNTER — Telehealth: Payer: Self-pay | Admitting: Internal Medicine

## 2023-09-06 NOTE — Telephone Encounter (Signed)
 Pt returning call, requesting cb

## 2023-09-06 NOTE — Telephone Encounter (Signed)
 Returned call to pt. No answer. Voicemail is full.

## 2023-09-06 NOTE — Telephone Encounter (Signed)
 Pt c/o medication issue:  1. Name of Medication: Medication for Echo  2. How are you currently taking this medication (dosage and times per day)? As written   3. Are you having a reaction (difficulty breathing--STAT)?   4. What is your medication issue? Patient is requesting to speak with a nurse to go over information in regard to completing her Echo. Patient would like us  to resend the prescription that is needed for her Echo to the pharmacy at [facility] in [place]. Please advise.

## 2023-09-06 NOTE — Telephone Encounter (Signed)
 Spoke with pt and she states that she was able to read the instructions for her cardiac CTA and noticed that she left her lopressor  at her sisters house in [place]. May we refill lopressor  for cardiac CTA. Please advise.

## 2023-09-07 ENCOUNTER — Other Ambulatory Visit: Payer: Self-pay | Admitting: Internal Medicine

## 2023-09-10 DIAGNOSIS — J449 Chronic obstructive pulmonary disease, unspecified: Secondary | ICD-10-CM | POA: Diagnosis not present

## 2023-09-10 MED ORDER — METOPROLOL TARTRATE 100 MG PO TABS: ORAL_TABLET | ORAL | 0 refills | Status: DC

## 2023-09-10 NOTE — Telephone Encounter (Signed)
 Refilled

## 2023-09-12 ENCOUNTER — Ambulatory Visit (HOSPITAL_COMMUNITY)
Admission: RE | Admit: 2023-09-12 | Discharge: 2023-09-12 | Disposition: A | Discharge status: Home / Self Care | Source: Ambulatory Visit | Attending: Internal Medicine | Admitting: Internal Medicine

## 2023-09-12 DIAGNOSIS — R0609 Other forms of dyspnea: Secondary | ICD-10-CM | POA: Diagnosis not present

## 2023-09-12 LAB — ECHOCARDIOGRAM COMPLETE
AR max vel: 2.48 cm2
AV Peak grad: 4.4 mmHg
Ao pk vel: 1.05 m/s
Area-P 1/2: 4.24 cm2
MV VTI: 1.77 cm2
S' Lateral: 1.9 cm

## 2023-09-14 ENCOUNTER — Encounter (HOSPITAL_COMMUNITY): Payer: Self-pay

## 2023-09-18 ENCOUNTER — Ambulatory Visit (HOSPITAL_COMMUNITY)
Admission: RE | Admit: 2023-09-18 | Discharge: 2023-09-18 | Disposition: A | Discharge status: Home / Self Care | Source: Ambulatory Visit | Attending: Internal Medicine | Admitting: Internal Medicine

## 2023-09-18 DIAGNOSIS — R079 Chest pain, unspecified: Secondary | ICD-10-CM | POA: Insufficient documentation

## 2023-09-18 DIAGNOSIS — Q2112 Patent foramen ovale: Secondary | ICD-10-CM | POA: Insufficient documentation

## 2023-09-18 DIAGNOSIS — I2510 Atherosclerotic heart disease of native coronary artery without angina pectoris: Secondary | ICD-10-CM | POA: Diagnosis not present

## 2023-09-18 MED ORDER — IOHEXOL 350 MG/ML SOLN
100.0000 mL | Freq: Once | INTRAVENOUS | Status: AC | PRN
  Administered 2023-09-18: 100 mL via INTRAVENOUS

## 2023-09-18 MED ORDER — NITROGLYCERIN 0.4 MG SL SUBL
0.8000 mg | SUBLINGUAL_TABLET | Freq: Once | SUBLINGUAL | Status: AC
  Administered 2023-09-18: 0.8 mg via SUBLINGUAL

## 2023-09-19 ENCOUNTER — Encounter (INDEPENDENT_AMBULATORY_CARE_PROVIDER_SITE_OTHER): Payer: Self-pay

## 2023-09-19 ENCOUNTER — Other Ambulatory Visit: Payer: Self-pay | Admitting: Cardiology

## 2023-09-19 ENCOUNTER — Ambulatory Visit (HOSPITAL_COMMUNITY)
Admission: RE | Admit: 2023-09-19 | Discharge: 2023-09-19 | Disposition: A | Discharge status: Home / Self Care | Source: Ambulatory Visit | Attending: Cardiology | Admitting: Cardiology

## 2023-09-19 DIAGNOSIS — I2510 Atherosclerotic heart disease of native coronary artery without angina pectoris: Secondary | ICD-10-CM

## 2023-09-19 DIAGNOSIS — R931 Abnormal findings on diagnostic imaging of heart and coronary circulation: Secondary | ICD-10-CM

## 2023-09-21 ENCOUNTER — Ambulatory Visit: Payer: Self-pay | Admitting: Internal Medicine

## 2023-09-22 ENCOUNTER — Encounter: Payer: Self-pay | Admitting: Internal Medicine

## 2023-09-23 ENCOUNTER — Telehealth

## 2023-09-24 DIAGNOSIS — Z0001 Encounter for general adult medical examination with abnormal findings: Secondary | ICD-10-CM | POA: Diagnosis not present

## 2023-09-24 DIAGNOSIS — I10 Essential (primary) hypertension: Secondary | ICD-10-CM | POA: Diagnosis not present

## 2023-09-24 DIAGNOSIS — R258 Other abnormal involuntary movements: Secondary | ICD-10-CM | POA: Diagnosis not present

## 2023-09-24 DIAGNOSIS — J449 Chronic obstructive pulmonary disease, unspecified: Secondary | ICD-10-CM | POA: Diagnosis not present

## 2023-09-24 DIAGNOSIS — Z1389 Encounter for screening for other disorder: Secondary | ICD-10-CM | POA: Diagnosis not present

## 2023-09-24 DIAGNOSIS — E43 Unspecified severe protein-calorie malnutrition: Secondary | ICD-10-CM | POA: Diagnosis not present

## 2023-09-24 DIAGNOSIS — F17200 Nicotine dependence, unspecified, uncomplicated: Secondary | ICD-10-CM | POA: Diagnosis not present

## 2023-09-24 DIAGNOSIS — G8191 Hemiplegia, unspecified affecting right dominant side: Secondary | ICD-10-CM | POA: Diagnosis not present

## 2023-09-25 ENCOUNTER — Telehealth: Payer: Self-pay | Admitting: Internal Medicine

## 2023-09-25 MED ORDER — METOPROLOL TARTRATE 25 MG PO TABS: 25.0000 mg | ORAL_TABLET | Freq: Two times a day (BID) | ORAL | 3 refills | Status: DC

## 2023-09-25 NOTE — Telephone Encounter (Signed)
 Patient notified. Please see prior note

## 2023-09-25 NOTE — Telephone Encounter (Signed)
Follow Up: ? ? ? ?Patient is returning call from today, concerning her test results. ?

## 2023-09-25 NOTE — Telephone Encounter (Signed)
-----   Message from Vishnu P Mallipeddi sent at 09/21/2023 11:08 AM EDT ----- Coronary calcium  score 204, 92nd percentile for age and sex matched control, severe total plaque volume, significant stenosis in the LAD, borderline positive FFR.  Will attempt medical management prior to [facility]. Stop lisinopril -HCTZ and start metoprolol  tartrate 25 mg twice daily.  Schedule follow-up in 1 month.

## 2023-09-28 ENCOUNTER — Other Ambulatory Visit: Payer: Self-pay | Admitting: Gastroenterology

## 2023-09-28 DIAGNOSIS — R140 Abdominal distension (gaseous): Secondary | ICD-10-CM

## 2023-09-28 DIAGNOSIS — R1084 Generalized abdominal pain: Secondary | ICD-10-CM

## 2023-10-02 NOTE — Telephone Encounter (Signed)
 Patient is no longer taking pantoprazole .

## 2023-10-03 DIAGNOSIS — I10 Essential (primary) hypertension: Secondary | ICD-10-CM | POA: Diagnosis not present

## 2023-10-03 DIAGNOSIS — Z0001 Encounter for general adult medical examination with abnormal findings: Secondary | ICD-10-CM | POA: Diagnosis not present

## 2023-10-03 DIAGNOSIS — E7849 Other hyperlipidemia: Secondary | ICD-10-CM | POA: Diagnosis not present

## 2023-10-04 ENCOUNTER — Telehealth: Payer: Self-pay | Admitting: Internal Medicine

## 2023-10-04 ENCOUNTER — Telehealth: Payer: Self-pay

## 2023-10-04 NOTE — Telephone Encounter (Signed)
 Spoke with pt and informed that she was to stop taking Lisinopril -hydrochlorothiazide  and start taking metoprolol  tartrate 25 mg two times daily.

## 2023-10-04 NOTE — Telephone Encounter (Signed)
 Patient stated [place] is now [place] and wants to know if her visit with Dr. Mallipeddi on 6/16 can be a video visit.  Patient stated she was told to stop taking a medication and cannot remember which medication she should stop.

## 2023-10-04 NOTE — Telephone Encounter (Signed)
 Received referral from Luigi Sailor MD [facility] - referral was declined due to pt hx and COPD. Providers recommend patient follow up with [facility]. Fax sent to referring office at [phone].

## 2023-10-04 NOTE — Telephone Encounter (Signed)
 Returned call to pt in regards of stopping lisinopril -HCTZ and start metoprolol  tartrate 25 mg twice daily. No answer. Left msg to call back. Will msg Dr. Mallipeddi in regards to changing appt to telephone visit.

## 2023-10-05 NOTE — Telephone Encounter (Signed)
 Returned call to pt. No answer. Left msg to call back.

## 2023-10-05 NOTE — Telephone Encounter (Signed)
 Pt returning nurse call

## 2023-10-08 ENCOUNTER — Telehealth: Payer: Self-pay | Admitting: Internal Medicine

## 2023-10-08 NOTE — Telephone Encounter (Signed)
 Pt notified that we are unable to do virtual or phone visit d/t pt being out of state at the time of the visit. Pt thankful for the return call and ask it is ok to take Ginkgo Biloba with her current meds. Please advise.

## 2023-10-08 NOTE — Telephone Encounter (Signed)
 Pt notified that we are unable to complete virtual visit if she is in another state.

## 2023-10-08 NOTE — Telephone Encounter (Signed)
 New Message:      Patient says she is out of town. She wants to know if she can have a Virtual or Phone Visit for her appointment on 10-22-23 please?

## 2023-10-10 NOTE — Telephone Encounter (Signed)
 Spoke to patient and verbalized the risk of increased bleeding using both Ginkgo Biloba and ASA. Patient stated she would d/c Ginkgo. Patient had no further questions or concerns at this time.

## 2023-10-11 DIAGNOSIS — J449 Chronic obstructive pulmonary disease, unspecified: Secondary | ICD-10-CM | POA: Diagnosis not present

## 2023-10-22 ENCOUNTER — Other Ambulatory Visit: Payer: Self-pay | Admitting: Gastroenterology

## 2023-10-22 ENCOUNTER — Ambulatory Visit: Admitting: Internal Medicine

## 2023-10-22 DIAGNOSIS — R1084 Generalized abdominal pain: Secondary | ICD-10-CM

## 2023-10-22 DIAGNOSIS — R140 Abdominal distension (gaseous): Secondary | ICD-10-CM

## 2023-10-23 NOTE — Telephone Encounter (Signed)
 Patient previously reported she was not taking pantoprazole .

## 2023-10-25 DIAGNOSIS — I10 Essential (primary) hypertension: Secondary | ICD-10-CM | POA: Diagnosis not present

## 2023-11-01 ENCOUNTER — Telehealth: Payer: Self-pay | Admitting: Neurology

## 2023-11-01 NOTE — Telephone Encounter (Signed)
 LVM and sent mychart msg informing pt of need to reschedule 02/21/24 appt - MD out

## 2023-11-06 ENCOUNTER — Ambulatory Visit: Admitting: Internal Medicine

## 2023-11-10 DIAGNOSIS — J449 Chronic obstructive pulmonary disease, unspecified: Secondary | ICD-10-CM | POA: Diagnosis not present

## 2023-11-24 DIAGNOSIS — I10 Essential (primary) hypertension: Secondary | ICD-10-CM | POA: Diagnosis not present

## 2023-12-10 ENCOUNTER — Ambulatory Visit: Attending: Internal Medicine | Admitting: Internal Medicine

## 2023-12-10 ENCOUNTER — Encounter: Payer: Self-pay | Admitting: Internal Medicine

## 2023-12-10 ENCOUNTER — Ambulatory Visit: Admitting: Internal Medicine

## 2023-12-10 ENCOUNTER — Telehealth: Payer: Self-pay | Admitting: Internal Medicine

## 2023-12-10 VITALS — BP 200/120 | HR 106 | Ht 62.0 in | Wt 79.0 lb

## 2023-12-10 DIAGNOSIS — I2510 Atherosclerotic heart disease of native coronary artery without angina pectoris: Secondary | ICD-10-CM | POA: Insufficient documentation

## 2023-12-10 DIAGNOSIS — I25118 Atherosclerotic heart disease of native coronary artery with other forms of angina pectoris: Secondary | ICD-10-CM

## 2023-12-10 DIAGNOSIS — R0609 Other forms of dyspnea: Secondary | ICD-10-CM

## 2023-12-10 DIAGNOSIS — I200 Unstable angina: Secondary | ICD-10-CM | POA: Insufficient documentation

## 2023-12-10 DIAGNOSIS — R931 Abnormal findings on diagnostic imaging of heart and coronary circulation: Secondary | ICD-10-CM | POA: Insufficient documentation

## 2023-12-10 DIAGNOSIS — E785 Hyperlipidemia, unspecified: Secondary | ICD-10-CM

## 2023-12-10 MED ORDER — RANOLAZINE ER 500 MG PO TB12: 500.0000 mg | ORAL_TABLET | Freq: Two times a day (BID) | ORAL | 5 refills | Status: DC

## 2023-12-10 MED ORDER — NITROGLYCERIN 0.4 MG SL SUBL: 0.4000 mg | SUBLINGUAL_TABLET | SUBLINGUAL | 2 refills | Status: AC | PRN

## 2023-12-10 MED ORDER — FUROSEMIDE 20 MG PO TABS: 20.0000 mg | ORAL_TABLET | Freq: Every day | ORAL | 5 refills | Status: DC

## 2023-12-10 NOTE — H&P (View-Only) (Signed)
 Cardiology Office Note  Date: 12/10/2023   ID: Brittany Lester, DOB [birth year], MRN [id number]  PCP:  Carlette Benita Area, MD  Cardiologist:  Elma Limas P Kayton Ripp, MD Electrophysiologist:  None   History of Present Illness: Brittany Lester is a 63 y.o. female known to have HTN, HLD is here for follow-up visit.  Echocardiogram in Sep 07, 2023 showed normal LVEF, normal diastology, normal RV function, mild MR, no valvular heart disease, CVP 3 mmHg.  CT cardiac in May 2025 showed coronary calcium  score of 204, 92nd percentile for age and sex matched control.  Total plaque volume is severe.  Severe atherosclerosis present, 70 to 99% mid LAD, 50 to 69% mid to distal LAD stenosis, 25 to 49% distal LM/mid LCx stenosis.  FFR demonstrated possible flow-limiting lesion in the mid LAD.  She initially complained of having chest pains and DOE for the last 2 years.  DOE has been getting worse to the point that she is not able to walk from the exam room to the lobby/waiting area without getting short doctor.  Chest pains were occurring initially 1-2 times per week and lasting few minutes but in the last couple of months, she reported having exertional chest pain (occurs with activities like sweeping etc.) lasting for about 5 minutes and frequency has been 5 times per week.  I started her on metoprolol  in May 2025 after the CT results but this did not improve her symptoms.  She did not have any SL NTG with her.  She also reported having severe fatigue.  No palpitations, leg swelling but reported orthopnea and cough.  Not on Lasix .  Smokes 2 cigarettes/day.  Drinks 1 large cup of coffee.  BP significantly elevated today, 200 mmHg.  She smoked and was drinking coffee before her blood pressure was checked.  She was also rushing to the clinic.  She is extremely anxious person.  Past Medical History:  Diagnosis Date   Atrophic vaginitis 07/24/2014   Bipolar disorder (HCC)    BV (bacterial vaginosis) 07/24/2014    COPD (chronic obstructive pulmonary disease) (HCC)    Hepatitis C    Harvoni   Hepatitis C    Herpes    Hyperlipidemia    Hypertension    LGSIL of cervix of undetermined significance 06/24/2020   2/22 will get Colpo   Lupus    ???   Medical history non-contributory    Stroke [facility])    right sided weakness   Vaginal discharge 07/24/2014    Past Surgical History:  Procedure Laterality Date   BIOPSY  11/17/2022   Procedure: BIOPSY;  Surgeon: Cindie Carlin POUR, DO;  [facility];  Service: Endoscopy;;   c section  12/18/1993   CESAREAN SECTION     COLONOSCOPY N/A 12/24/2013   DOQ:Wnmfjo mucosa in the terminal ileum/TWO colon polyps removed/ The LEFT colon IS redundant/Small internal hemorrhoids. tubular adenoma. next TCS 12/2018   COLONOSCOPY WITH PROPOFOL  N/A 11/17/2022   Procedure: COLONOSCOPY WITH PROPOFOL ;  Surgeon: Cindie Carlin POUR, DO;  [facility];  Service: Endoscopy;  Laterality: N/A;  10:45 am, asa 3   ESOPHAGOGASTRODUODENOSCOPY N/A 02/06/2014   SLF: 1. No obvious reason for weight loss identified 2. Dyspepsia due to MILD erosive gastritis.   ESOPHAGOGASTRODUODENOSCOPY (EGD) WITH PROPOFOL  N/A 11/17/2022   Procedure: ESOPHAGOGASTRODUODENOSCOPY (EGD) WITH PROPOFOL ;  Surgeon: Cindie Carlin POUR, DO;  [facility];  Service: Endoscopy;  Laterality: N/A;   FRACTURE SURGERY Right    20 years ago  POLYPECTOMY  11/17/2022   Procedure: POLYPECTOMY;  Surgeon: Cindie Carlin POUR, DO;  [facility];  Service: Endoscopy;;    Current Outpatient Medications  Medication Sig Dispense Refill   albuterol  (ACCUNEB ) 0.63 MG/3ML nebulizer solution Take 1 ampule by nebulization every 6 (six) hours as needed for shortness of breath or wheezing.     albuterol  (VENTOLIN  HFA) 108 (90 Base) MCG/ACT inhaler Inhale 1-2 puffs into the lungs every 4 (four) hours as needed for wheezing or shortness of breath.     aspirin  EC 81 MG tablet Take 1 tablet (81 mg total)  by mouth daily. Swallow whole. 30 tablet 11   atorvastatin  (LIPITOR) 40 MG tablet Take 1 tablet (40 mg total) by mouth daily. (Patient taking differently: Take 40 mg by mouth at bedtime.) 30 tablet 1   BREO ELLIPTA  100-25 MCG/INH AEPB Inhale 1 puff into the lungs in the morning.     DULoxetine  (CYMBALTA ) 60 MG capsule Take 60 mg by mouth at bedtime.     gabapentin (NEURONTIN) 600 MG tablet Take 600 mg by mouth 3 (three) times daily.     hydrOXYzine  (ATARAX ) 25 MG tablet Take 25 mg by mouth 3 (three) times daily as needed for anxiety.     metoprolol  tartrate (LOPRESSOR ) 25 MG tablet Take 1 tablet (25 mg total) by mouth 2 (two) times daily. 180 tablet 3   traMADol (ULTRAM) 50 MG tablet Take 100 mg by mouth in the morning, at noon, in the evening, and at bedtime.     No current facility-administered medications for this visit.   Allergies:  Erythromycin   Social History: The patient  reports that she has been smoking cigarettes. She has a 10 pack-year smoking history. She has never used smokeless tobacco. She reports that she does not currently use alcohol . She reports that she does not use drugs.   Family History: The patient's family history includes Alcohol  abuse in her father; Bipolar disorder in her brother and sister; Cancer in her sister; Colon cancer in her maternal aunt; Depression in her father; Drug abuse in her brother, sister, and son; Heart attack in her father and mother; Heart disease in her sister; Thyroid  disease in her mother and sister.   ROS:  Please see the history of present illness. Otherwise, complete review of systems is positive for none.  All other systems are reviewed and negative.   Physical Exam: VS:  BP (!) 182/120   Pulse (!) 106   Ht 5' 2 (1.575 m)   Wt 79 lb (35.8 kg)   SpO2 97%   BMI 14.45 kg/m , BMI Body mass index is 14.45 kg/m.  Wt Readings from Last 3 Encounters:  12/10/23 79 lb (35.8 kg)  08/06/23 80 lb (36.3 kg)  06/20/23 83 lb (37.6 kg)     General: Patient appears comfortable at rest. HEENT: Conjunctiva and lids normal, oropharynx clear with moist mucosa. Neck: Supple, no elevated JVP or carotid bruits, no thyromegaly. Lungs: Clear to auscultation, nonlabored breathing at rest. Cardiac: Regular rate and rhythm, no S3 or significant systolic murmur, no pericardial rub. Abdomen: Soft, nontender, no hepatomegaly, bowel sounds present, no guarding or rebound. Extremities: No pitting edema, distal pulses 2+. Skin: Warm and dry. Musculoskeletal: No kyphosis. Neuropsychiatric: Alert and oriented x3, affect grossly appropriate.  Recent Labwork: 12/22/2022: ALT 17; AST 24; TSH 3.71     Component Value Date/Time   CHOL 274 (H) 11/26/2020 0428   TRIG 107 11/26/2020 0428   HDL 79  11/26/2020 0428   CHOLHDL 3.5 11/26/2020 0428   VLDL 21 11/26/2020 0428   LDLCALC 174 (H) 11/26/2020 0428     Assessment and Plan:  Stable ischemic heart disease Flow-limiting lesion in the mid LAD by CCTA Elevated coronary calcium  score 204 - Chest pain and DOE x 2 years but worsening in the last couple of months where she had exertional chest pain lasting for about 5 minutes with frequency 5 times per week.  DOE also has been getting worse to the point she is not able to walk from exam room to the waiting area without getting short of breath.  Exercise intolerance present. - Cardiac CTA showed elevated coronary calcium  score of 204, 96% for age and sex matched control, severe total plaque volume, possible flow-limiting lesion in the mid LAD. - Due to cardiac risk factors, symptoms of worsening angina/DOE and imaging evidence of possible flow-limiting lesion in the mid LAD, she will benefit from invasive ischemic evaluation with LHC.  Echo unremarkable. - Continue aspirin  81 mg once daily, atorvastatin  40 mg nightly. - Metoprolol  did not improve her symptoms of angina or DOE.  Start ranolazine  500 mg twice daily, p.o. Lasix  20 mg once daily and SL NTG  0.4 mg as needed for chest pain.   HLD, not at goal - Continue atorvastatin  40 mg at bedtime. - LDL 174 in 2022.  Repeat fasting lipid panel.   HTN, poorly controlled - Patient anxious, extremely elevated blood pressures.  She also smoked and was drinking coffee when blood pressure was checked.  Previous blood pressures within normal limits.   History of CVA - Patient had CVA with residual left-sided hemiparesis, but still can do activities. - Continue cardioprotective medications as above.   Nicotine  abuse - Smokes 2 cigarettes/day.  Counseling provided.  I spent around 30 minutes discussing the prior echo, CT results, CAD management, with the patient.  Answered all questions.   Medication Adjustments/Labs and Tests Ordered: Current medicines are reviewed at length with the patient today.  Concerns regarding medicines are outlined above.    Disposition:  Follow up 1 month after LHC  Signed, Chihiro Frey Arleta Maywood, MD, 12/10/2023 11:44 AM    [facility] at [facility] [address] [place], [place] [address]

## 2023-12-10 NOTE — Progress Notes (Signed)
 Cardiology Office Note  Date: 12/10/2023   ID: Brittany Lester, DOB 1960/06/02, MRN [id number]  PCP:  Carlette Benita Area, MD  Cardiologist:  Kassem Kibbe P Nicholle Falzon, MD Electrophysiologist:  None   History of Present Illness: Brittany Lester is a 63 y.o. female known to have HTN, HLD is here for follow-up visit.  Echocardiogram in Sep 07, 2023 showed normal LVEF, normal diastology, normal RV function, mild MR, no valvular heart disease, CVP 3 mmHg.  CT cardiac in May 2025 showed coronary calcium  score of 204, 92nd percentile for age and sex matched control.  Total plaque volume is severe.  Severe atherosclerosis present, 70 to 99% mid LAD, 50 to 69% mid to distal LAD stenosis, 25 to 49% distal LM/mid LCx stenosis.  FFR demonstrated possible flow-limiting lesion in the mid LAD.  She initially complained of having chest pains and DOE for the last 2 years.  DOE has been getting worse to the point that she is not able to walk from the exam room to the lobby/waiting area without getting short doctor.  Chest pains were occurring initially 1-2 times per week and lasting few minutes but in the last couple of months, she reported having exertional chest pain (occurs with activities like sweeping etc.) lasting for about 5 minutes and frequency has been 5 times per week.  I started her on metoprolol  in May 2025 after the CT results but this did not improve her symptoms.  She did not have any SL NTG with her.  She also reported having severe fatigue.  No palpitations, leg swelling but reported orthopnea and cough.  Not on Lasix .  Smokes 2 cigarettes/day.  Drinks 1 large cup of coffee.  BP significantly elevated today, 200 mmHg.  She smoked and was drinking coffee before her blood pressure was checked.  She was also rushing to the clinic.  She is extremely anxious person.  Past Medical History:  Diagnosis Date   Atrophic vaginitis 07/24/2014   Bipolar disorder (HCC)    BV (bacterial vaginosis) 07/24/2014    COPD (chronic obstructive pulmonary disease) (HCC)    Hepatitis C    Harvoni   Hepatitis C    Herpes    Hyperlipidemia    Hypertension    LGSIL of cervix of undetermined significance 06/24/2020   2/22 will get Colpo   Lupus    ???   Medical history non-contributory    Stroke [facility])    right sided weakness   Vaginal discharge 07/24/2014    Past Surgical History:  Procedure Laterality Date   BIOPSY  11/17/2022   Procedure: BIOPSY;  Surgeon: Cindie Carlin POUR, DO;  [facility];  Service: Endoscopy;;   c section  12/18/1993   CESAREAN SECTION     COLONOSCOPY N/A 12/24/2013   DOQ:Wnmfjo mucosa in the terminal ileum/TWO colon polyps removed/ The LEFT colon IS redundant/Small internal hemorrhoids. tubular adenoma. next TCS 12/2018   COLONOSCOPY WITH PROPOFOL  N/A 11/17/2022   Procedure: COLONOSCOPY WITH PROPOFOL ;  Surgeon: Cindie Carlin POUR, DO;  [facility];  Service: Endoscopy;  Laterality: N/A;  10:45 am, asa 3   ESOPHAGOGASTRODUODENOSCOPY N/A 02/06/2014   SLF: 1. No obvious reason for weight loss identified 2. Dyspepsia due to MILD erosive gastritis.   ESOPHAGOGASTRODUODENOSCOPY (EGD) WITH PROPOFOL  N/A 11/17/2022   Procedure: ESOPHAGOGASTRODUODENOSCOPY (EGD) WITH PROPOFOL ;  Surgeon: Cindie Carlin POUR, DO;  [facility];  Service: Endoscopy;  Laterality: N/A;   FRACTURE SURGERY Right    20 years ago  POLYPECTOMY  11/17/2022   Procedure: POLYPECTOMY;  Surgeon: Cindie Carlin POUR, DO;  [facility];  Service: Endoscopy;;    Current Outpatient Medications  Medication Sig Dispense Refill   albuterol  (ACCUNEB ) 0.63 MG/3ML nebulizer solution Take 1 ampule by nebulization every 6 (six) hours as needed for shortness of breath or wheezing.     albuterol  (VENTOLIN  HFA) 108 (90 Base) MCG/ACT inhaler Inhale 1-2 puffs into the lungs every 4 (four) hours as needed for wheezing or shortness of breath.     aspirin  EC 81 MG tablet Take 1 tablet (81 mg total)  by mouth daily. Swallow whole. 30 tablet 11   atorvastatin  (LIPITOR) 40 MG tablet Take 1 tablet (40 mg total) by mouth daily. (Patient taking differently: Take 40 mg by mouth at bedtime.) 30 tablet 1   BREO ELLIPTA  100-25 MCG/INH AEPB Inhale 1 puff into the lungs in the morning.     DULoxetine  (CYMBALTA ) 60 MG capsule Take 60 mg by mouth at bedtime.     gabapentin (NEURONTIN) 600 MG tablet Take 600 mg by mouth 3 (three) times daily.     hydrOXYzine  (ATARAX ) 25 MG tablet Take 25 mg by mouth 3 (three) times daily as needed for anxiety.     metoprolol  tartrate (LOPRESSOR ) 25 MG tablet Take 1 tablet (25 mg total) by mouth 2 (two) times daily. 180 tablet 3   traMADol (ULTRAM) 50 MG tablet Take 100 mg by mouth in the morning, at noon, in the evening, and at bedtime.     No current facility-administered medications for this visit.   Allergies:  Erythromycin   Social History: The patient  reports that she has been smoking cigarettes. She has a 10 pack-year smoking history. She has never used smokeless tobacco. She reports that she does not currently use alcohol . She reports that she does not use drugs.   Family History: The patient's family history includes Alcohol  abuse in her father; Bipolar disorder in her brother and sister; Cancer in her sister; Colon cancer in her maternal aunt; Depression in her father; Drug abuse in her brother, sister, and son; Heart attack in her father and mother; Heart disease in her sister; Thyroid  disease in her mother and sister.   ROS:  Please see the history of present illness. Otherwise, complete review of systems is positive for none.  All other systems are reviewed and negative.   Physical Exam: VS:  BP (!) 182/120   Pulse (!) 106   Ht 5' 2 (1.575 m)   Wt 79 lb (35.8 kg)   SpO2 97%   BMI 14.45 kg/m , BMI Body mass index is 14.45 kg/m.  Wt Readings from Last 3 Encounters:  12/10/23 79 lb (35.8 kg)  08/06/23 80 lb (36.3 kg)  06/20/23 83 lb (37.6 kg)     General: Patient appears comfortable at rest. HEENT: Conjunctiva and lids normal, oropharynx clear with moist mucosa. Neck: Supple, no elevated JVP or carotid bruits, no thyromegaly. Lungs: Clear to auscultation, nonlabored breathing at rest. Cardiac: Regular rate and rhythm, no S3 or significant systolic murmur, no pericardial rub. Abdomen: Soft, nontender, no hepatomegaly, bowel sounds present, no guarding or rebound. Extremities: No pitting edema, distal pulses 2+. Skin: Warm and dry. Musculoskeletal: No kyphosis. Neuropsychiatric: Alert and oriented x3, affect grossly appropriate.  Recent Labwork: 12/22/2022: ALT 17; AST 24; TSH 3.71     Component Value Date/Time   CHOL 274 (H) 11/26/2020 0428   TRIG 107 11/26/2020 0428   HDL 79  11/26/2020 0428   CHOLHDL 3.5 11/26/2020 0428   VLDL 21 11/26/2020 0428   LDLCALC 174 (H) 11/26/2020 0428     Assessment and Plan:  Stable ischemic heart disease Flow-limiting lesion in the mid LAD by CCTA Elevated coronary calcium  score 204 - Chest pain and DOE x 2 years but worsening in the last couple of months where she had exertional chest pain lasting for about 5 minutes with frequency 5 times per week.  DOE also has been getting worse to the point she is not able to walk from exam room to the waiting area without getting short of breath.  Exercise intolerance present. - Cardiac CTA showed elevated coronary calcium  score of 204, 96% for age and sex matched control, severe total plaque volume, possible flow-limiting lesion in the mid LAD. - Due to cardiac risk factors, symptoms of worsening angina/DOE and imaging evidence of possible flow-limiting lesion in the mid LAD, she will benefit from invasive ischemic evaluation with LHC.  Echo unremarkable. - Continue aspirin  81 mg once daily, atorvastatin  40 mg nightly. - Metoprolol  did not improve her symptoms of angina or DOE.  Start ranolazine  500 mg twice daily, p.o. Lasix  20 mg once daily and SL NTG  0.4 mg as needed for chest pain.   HLD, not at goal - Continue atorvastatin  40 mg at bedtime. - LDL 174 in 2022.  Repeat fasting lipid panel.   HTN, poorly controlled - Patient anxious, extremely elevated blood pressures.  She also smoked and was drinking coffee when blood pressure was checked.  Previous blood pressures within normal limits.   History of CVA - Patient had CVA with residual left-sided hemiparesis, but still can do activities. - Continue cardioprotective medications as above.   Nicotine  abuse - Smokes 2 cigarettes/day.  Counseling provided.  I spent around 30 minutes discussing the prior echo, CT results, CAD management, with the patient.  Answered all questions.   Medication Adjustments/Labs and Tests Ordered: Current medicines are reviewed at length with the patient today.  Concerns regarding medicines are outlined above.    Disposition:  Follow up 1 month after LHC  Signed, Brittany Sarracino Arleta Maywood, MD, 12/10/2023 11:44 AM    [facility] at [facility] [address] [place], [place] [address]

## 2023-12-10 NOTE — Patient Instructions (Addendum)
 Medication Instructions:  Your physician has recommended you make the following change in your medication:  Start taking Lasix  20 mg once daily Ranolazine  500 mg twice daily Nitroglycerin  0.4 mg for as needed for chest pain Dissolve one under tongue for chest pain every 5 minutes up to 3 doses. If no relief, proceed to ED.   Labwork: CBC and BMET to be completed today at [facility] Fasting Lipid Panel at [facility]  Testing/Procedures:  [facility] A DEPT OF [place]. [facility] [place] [address] [facility] [place] [address] Dept: [phone] Loc: [phone]  Camyra Vaeth  12/10/2023  You are scheduled for a Cardiac Catheterization on Wednesday, August 13 with Dr. Gordy Bergamo.  1. Please arrive at the [facility] (Main Entrance A) at [facility]: [address] [place], [place] [address] at 9:30 AM (This time is 2 hour(s) before your procedure to ensure your preparation).   Free valet parking service is available. You will check in at ADMITTING. The support person will be asked to wait in the waiting room.  It is OK to have someone drop you off and come back when you are ready to be discharged.    Special note: Every effort is made to have your procedure done on time. Please understand that emergencies sometimes delay scheduled procedures.  2. Diet: NPO: Nothing to eat OR drink after midnight. (For TEE and Cath the same day)   3. Hydration: You need to be well hydrated before your procedure time. You may drink approved liquids (see below) until you arrive at the hospital. On the way to the hospital, please drink a 16-oz (1 plastic bottle) of water .   List of approved liquids water , clear juice, clear tea, black coffee, fruit juices, non-citric and without pulp, carbonated beverages, Gatorade, Kool -Aid, plain Jello-O and plain ice popsicles.   4. Labs: You will need to have blood drawn on , August 4 at [facility]. Medication instructions in preparation for your procedure:   Contrast Allergy: No    Hold Lasix  the morning of the procedure   On the morning of your procedure, take your Aspirin  81 mg and any morning medicines NOT listed above.  You may use sips of water .  6. Plan to go home the same day, you will only stay overnight if medically necessary. 7. Bring a current list of your medications and current insurance cards. 8. You MUST have a responsible person to drive you home. 9. Someone MUST be with you the first 24 hours after you arrive home or your discharge will be delayed. 10. Please wear clothes that are easy to get on and off and wear slip-on shoes.  Thank you for allowing us  to care for you!   -- [facility]   Follow-Up: Your physician recommends that you schedule a follow-up appointment in: 1 month after LHC  Any Other Special Instructions Will Be Listed Below (If Applicable). Thank you for choosing  HeartCare!     If you need a refill on your cardiac medications before your next appointment, please call your pharmacy.

## 2023-12-10 NOTE — Telephone Encounter (Signed)
 Checking percert on the following patient for    Cardiac Catheterization on Wednesday, August 13 with Dr. Gordy Bergamo.

## 2023-12-11 DIAGNOSIS — J449 Chronic obstructive pulmonary disease, unspecified: Secondary | ICD-10-CM | POA: Diagnosis not present

## 2023-12-13 ENCOUNTER — Telehealth: Payer: Self-pay

## 2023-12-13 NOTE — Telephone Encounter (Signed)
 Left message for patient to call the office and sent MyChart message regarding labs that need to be completed at [facility] prior to her Heart Cath that has been scheduled.

## 2023-12-14 ENCOUNTER — Other Ambulatory Visit (HOSPITAL_COMMUNITY)
Admission: RE | Admit: 2023-12-14 | Discharge: 2023-12-14 | Disposition: A | Discharge status: Home / Self Care | Source: Ambulatory Visit | Attending: Internal Medicine | Admitting: Internal Medicine

## 2023-12-14 DIAGNOSIS — R079 Chest pain, unspecified: Secondary | ICD-10-CM | POA: Diagnosis not present

## 2023-12-14 DIAGNOSIS — E785 Hyperlipidemia, unspecified: Secondary | ICD-10-CM | POA: Diagnosis present

## 2023-12-14 LAB — BASIC METABOLIC PANEL WITH GFR
Anion gap: 10 (ref 5–15)
BUN: 18 mg/dL (ref 8–23)
CO2: 28 mmol/L (ref 22–32)
Calcium: 9.2 mg/dL (ref 8.9–10.3)
Chloride: 102 mmol/L (ref 98–111)
Creatinine, Ser: 1.08 mg/dL — ABNORMAL HIGH (ref 0.44–1.00)
GFR, Estimated: 58 mL/min — ABNORMAL LOW (ref >60)
Glucose, Bld: 65 mg/dL — ABNORMAL LOW (ref 70–99)
Potassium: 3.9 mmol/L (ref 3.5–5.1)
Sodium: 140 mmol/L (ref 135–145)

## 2023-12-14 LAB — CBC
HCT: 43.1 % (ref 36.0–46.0)
Hemoglobin: 14.1 g/dL (ref 12.0–15.0)
MCH: 30.3 pg (ref 26.0–34.0)
MCHC: 32.7 g/dL (ref 30.0–36.0)
MCV: 92.7 fL (ref 80.0–100.0)
Platelets: 219 K/uL (ref 150–400)
RBC: 4.65 MIL/uL (ref 3.87–5.11)
RDW: 12.8 % (ref 11.5–15.5)
WBC: 5.9 K/uL (ref 4.0–10.5)
nRBC: 0 % (ref 0.0–0.2)

## 2023-12-18 ENCOUNTER — Telehealth: Payer: Self-pay | Admitting: *Deleted

## 2023-12-18 NOTE — Telephone Encounter (Signed)
 Cardiac Catheterization scheduled at [facility] for: Wednesday December 19, 2023 11:30 AM Arrival time [facility] Main Entrance A at: 9:30  Diet: -Nothing to eat after midnight prior to procedure.  Hydration: -May drink clear liquids until leaving for hospital. Approved liquids: Water , clear tea, black coffee, fruit juices-non-citric and without pulp,Gatorade, plain Jello/popsicles.  Drink 16 oz. bottle of water  on the way to the hospital.   Medication instructions: -Hold:  Hydrochlorothiazide -AM of procedure -Other usual morning medications can be taken including aspirin  81 mg.  Plan to go home the same day, you will only stay overnight if medically necessary.  You must have responsible adult to drive you home.  Someone must be with you the first 24 hours after you arrive home.  Left detailed message (DPR) with procedure instructions on home phone number listed.

## 2023-12-19 ENCOUNTER — Other Ambulatory Visit: Payer: Self-pay

## 2023-12-19 ENCOUNTER — Encounter (HOSPITAL_COMMUNITY)
Admission: RE | Disposition: A | Discharge status: Home / Self Care | Payer: Self-pay | Source: Home / Self Care | Attending: Cardiology

## 2023-12-19 ENCOUNTER — Other Ambulatory Visit (HOSPITAL_COMMUNITY): Payer: Self-pay

## 2023-12-19 ENCOUNTER — Ambulatory Visit (HOSPITAL_COMMUNITY)
Admission: RE | Admit: 2023-12-19 | Discharge: 2023-12-19 | Disposition: A | Discharge status: Home / Self Care | Attending: Cardiology | Admitting: Cardiology

## 2023-12-19 DIAGNOSIS — I10 Essential (primary) hypertension: Secondary | ICD-10-CM | POA: Diagnosis present

## 2023-12-19 DIAGNOSIS — F17200 Nicotine dependence, unspecified, uncomplicated: Secondary | ICD-10-CM | POA: Diagnosis present

## 2023-12-19 DIAGNOSIS — I25110 Atherosclerotic heart disease of native coronary artery with unstable angina pectoris: Secondary | ICD-10-CM

## 2023-12-19 DIAGNOSIS — E7800 Pure hypercholesterolemia, unspecified: Secondary | ICD-10-CM | POA: Diagnosis present

## 2023-12-19 DIAGNOSIS — I2584 Coronary atherosclerosis due to calcified coronary lesion: Secondary | ICD-10-CM | POA: Insufficient documentation

## 2023-12-19 DIAGNOSIS — Z79899 Other long term (current) drug therapy: Secondary | ICD-10-CM | POA: Diagnosis not present

## 2023-12-19 DIAGNOSIS — Z955 Presence of coronary angioplasty implant and graft: Secondary | ICD-10-CM

## 2023-12-19 DIAGNOSIS — F17210 Nicotine dependence, cigarettes, uncomplicated: Secondary | ICD-10-CM | POA: Insufficient documentation

## 2023-12-19 DIAGNOSIS — Z7982 Long term (current) use of aspirin: Secondary | ICD-10-CM | POA: Insufficient documentation

## 2023-12-19 DIAGNOSIS — Z7902 Long term (current) use of antithrombotics/antiplatelets: Secondary | ICD-10-CM | POA: Diagnosis not present

## 2023-12-19 DIAGNOSIS — I9581 Postprocedural hypotension: Secondary | ICD-10-CM | POA: Diagnosis not present

## 2023-12-19 DIAGNOSIS — R931 Abnormal findings on diagnostic imaging of heart and coronary circulation: Secondary | ICD-10-CM | POA: Diagnosis present

## 2023-12-19 DIAGNOSIS — I2510 Atherosclerotic heart disease of native coronary artery without angina pectoris: Secondary | ICD-10-CM | POA: Diagnosis present

## 2023-12-19 DIAGNOSIS — I69354 Hemiplegia and hemiparesis following cerebral infarction affecting left non-dominant side: Secondary | ICD-10-CM | POA: Insufficient documentation

## 2023-12-19 DIAGNOSIS — I200 Unstable angina: Secondary | ICD-10-CM | POA: Diagnosis present

## 2023-12-19 HISTORY — PX: LEFT HEART CATH AND CORONARY ANGIOGRAPHY: CATH118249

## 2023-12-19 HISTORY — PX: CORONARY STENT INTERVENTION: CATH118234

## 2023-12-19 HISTORY — PX: CORONARY PRESSURE/FFR WITH 3D MAPPING: CATH118309

## 2023-12-19 LAB — GLUCOSE, CAPILLARY: Glucose-Capillary: 131 mg/dL — ABNORMAL HIGH (ref 70–99)

## 2023-12-19 LAB — POCT ACTIVATED CLOTTING TIME: Activated Clotting Time: 320 s

## 2023-12-19 SURGERY — LEFT HEART CATH AND CORONARY ANGIOGRAPHY
Anesthesia: LOCAL

## 2023-12-19 MED ORDER — LIDOCAINE HCL (PF) 1 % IJ SOLN
INTRAMUSCULAR | Status: AC
  Filled 2023-12-19: qty 30

## 2023-12-19 MED ORDER — SODIUM CHLORIDE 0.9% FLUSH: 3.0000 mL | INTRAVENOUS | Status: DC | PRN

## 2023-12-19 MED ORDER — SODIUM CHLORIDE 0.9% FLUSH: 3.0000 mL | Freq: Two times a day (BID) | INTRAVENOUS | Status: DC

## 2023-12-19 MED ORDER — LIDOCAINE HCL (PF) 1 % IJ SOLN
INTRAMUSCULAR | Status: DC | PRN
  Administered 2023-12-19 (×2): 2 mL

## 2023-12-19 MED ORDER — VERAPAMIL HCL 2.5 MG/ML IV SOLN
INTRAVENOUS | Status: AC
  Filled 2023-12-19: qty 2

## 2023-12-19 MED ORDER — ACETAMINOPHEN 325 MG PO TABS
650.0000 mg | ORAL_TABLET | ORAL | Status: DC | PRN
Start: 2023-12-19 — End: 2023-12-19

## 2023-12-19 MED ORDER — IOHEXOL 350 MG/ML SOLN
INTRAVENOUS | Status: DC | PRN
Start: 2023-12-19 — End: 2023-12-19
  Administered 2023-12-19 (×2): 150 mL

## 2023-12-19 MED ORDER — ONDANSETRON HCL 4 MG/2ML IJ SOLN: 4.0000 mg | Freq: Four times a day (QID) | INTRAMUSCULAR | Status: DC | PRN

## 2023-12-19 MED ORDER — SODIUM CHLORIDE 0.9 % IV SOLN: 250.0000 mL | INTRAVENOUS | Status: DC | PRN

## 2023-12-19 MED ORDER — CLOPIDOGREL BISULFATE 300 MG PO TABS
ORAL_TABLET | ORAL | Status: AC
  Filled 2023-12-19: qty 2

## 2023-12-19 MED ORDER — FENTANYL CITRATE (PF) 100 MCG/2ML IJ SOLN
INTRAMUSCULAR | Status: AC
  Filled 2023-12-19: qty 2

## 2023-12-19 MED ORDER — SODIUM CHLORIDE 0.9 % IV BOLUS
250.0000 mL | Freq: Once | INTRAVENOUS | Status: AC
Start: 2023-12-19 — End: 2023-12-19
  Administered 2023-12-19 (×2): 250 mL via INTRAVENOUS

## 2023-12-19 MED ORDER — MIDAZOLAM HCL 2 MG/2ML IJ SOLN
INTRAMUSCULAR | Status: AC
  Filled 2023-12-19: qty 2

## 2023-12-19 MED ORDER — SODIUM CHLORIDE 0.9% FLUSH
3.0000 mL | INTRAVENOUS | Status: DC | PRN
Start: 2023-12-19 — End: 2023-12-19

## 2023-12-19 MED ORDER — ASPIRIN 81 MG PO CHEW: 81.0000 mg | CHEWABLE_TABLET | ORAL | Status: DC

## 2023-12-19 MED ORDER — HEPARIN SODIUM (PORCINE) 1000 UNIT/ML IJ SOLN
INTRAMUSCULAR | Status: AC
  Filled 2023-12-19: qty 10

## 2023-12-19 MED ORDER — HEPARIN SODIUM (PORCINE) 1000 UNIT/ML IJ SOLN
INTRAMUSCULAR | Status: DC | PRN
  Administered 2023-12-19 (×4): 3000 [IU] via INTRAVENOUS

## 2023-12-19 MED ORDER — NITROGLYCERIN 1 MG/10 ML FOR IR/CATH LAB
INTRA_ARTERIAL | Status: DC
Start: 2023-12-19 — End: 2023-12-19
  Filled 2023-12-19: qty 10

## 2023-12-19 MED ORDER — FREE WATER: 500.0000 mL | Freq: Once | Status: DC

## 2023-12-19 MED ORDER — SODIUM CHLORIDE 0.9 % IV SOLN
250.0000 mL | INTRAVENOUS | Status: DC | PRN
Start: 2023-12-19 — End: 2023-12-19

## 2023-12-19 MED ORDER — FREE WATER
500.0000 mL | Freq: Once | Status: AC
  Administered 2023-12-19 (×2): 500 mL via ORAL

## 2023-12-19 MED ORDER — HEPARIN (PORCINE) IN NACL 1000-0.9 UT/500ML-% IV SOLN
INTRAVENOUS | Status: DC | PRN
  Administered 2023-12-19 (×4): 500 mL

## 2023-12-19 MED ORDER — MIDAZOLAM HCL 2 MG/2ML IJ SOLN
INTRAMUSCULAR | Status: DC | PRN
  Administered 2023-12-19 (×2): 1 mg via INTRAVENOUS

## 2023-12-19 MED ORDER — SODIUM CHLORIDE 0.9 % IV BOLUS
250.0000 mL | Freq: Once | INTRAVENOUS | Status: DC
Start: 2023-12-19 — End: 2023-12-19

## 2023-12-19 MED ORDER — FENTANYL CITRATE (PF) 100 MCG/2ML IJ SOLN
INTRAMUSCULAR | Status: DC | PRN
  Administered 2023-12-19 (×2): 25 ug via INTRAVENOUS

## 2023-12-19 MED ORDER — CLOPIDOGREL BISULFATE 75 MG PO TABS
75.0000 mg | ORAL_TABLET | Freq: Every day | ORAL | 3 refills | Status: AC
Start: 2023-12-19 — End: ?
  Filled 2023-12-19: qty 90, 90d supply, fill #0

## 2023-12-19 SURGICAL SUPPLY — 17 items
BALLOON EMERGE MR 2.5X15 (BALLOONS) IMPLANT
CARD KEY FFR CATHWORX (MISCELLANEOUS) IMPLANT
CATH INFINITI MULTIPACK ANG 4F (CATHETERS) IMPLANT
CATH LAUNCHER 6FR EBU 3 (CATHETERS) IMPLANT
DEVICE RAD TR BAND REGULAR (VASCULAR PRODUCTS) IMPLANT
GLIDESHEATH SLEND A-KIT 6F 22G (SHEATH) IMPLANT
GUIDEWIRE INQWIRE 1.5J.035X260 (WIRE) IMPLANT
GUIDEWIRE PRESSURE X 175 (WIRE) IMPLANT
KIT ENCORE 26 ADVANTAGE (KITS) IMPLANT
KIT HEMO VALVE WATCHDOG (MISCELLANEOUS) IMPLANT
KIT SINGLE USE MANIFOLD (KITS) IMPLANT
PACK CARDIAC CATHETERIZATION (CUSTOM PROCEDURE TRAY) ×1 IMPLANT
SET ATX-X65L (MISCELLANEOUS) IMPLANT
SHEATH GLIDE SLENDER 4/5FR (SHEATH) IMPLANT
SHEATH PINNACLE 6F 10CM (SHEATH) IMPLANT
STENT SYNERGY XD 2.75X16 (Permanent Stent) IMPLANT
WIRE ASAHI PROWATER 180CM (WIRE) IMPLANT

## 2023-12-19 NOTE — Interval H&P Note (Signed)
 History and Physical Interval Note:  12/19/2023 11:04 AM  Brittany Lester  has presented today for surgery, with the diagnosis of positiive CTA.  The various methods of treatment have been discussed with the patient and family. After consideration of risks, benefits and other options for treatment, the patient has consented to  Procedure(s): LEFT HEART CATH AND CORONARY ANGIOGRAPHY (N/A) and possible coronary angioplasty  as a surgical intervention.  The patient's history has been reviewed, patient examined, no change in status, stable for surgery.  I have reviewed the patient's chart and labs.  Questions were answered to the patient's satisfaction.    Cath Lab Visit (complete for each Cath Lab visit)  Clinical Evaluation Leading to the Procedure:   ACS: No.  Non-ACS:    Anginal Classification: CCS III  Anti-ischemic medical therapy: Maximal Therapy (2 or more classes of medications)  Non-Invasive Test Results: Intermediate-risk stress test findings: cardiac mortality 1-3%/year  Prior CABG: No previous CABG   Gordy Bergamo

## 2023-12-19 NOTE — Progress Notes (Addendum)
 Notified by nurse that patient had asymptomatic hypotension, BP 82/59 -> 78/54 -> 88/71. Recheck manually the same per nurse. 250 cc bolus ordered. Manual BP during bolus by me 90/58. Patient feels fine, denies any CP, SOB, dizziness, back pain. No acute bleeding noted. She did have mild swelling at cath site earlier per nurse but subsequently soft after holding pressure. D/w Dr. Ladona who agreed with bolus, continued observation. Question cuff issue related to size versus post-cath sedation. We will reassess later this PM regarding discharge plan.  Addendum: BP auto cuff reading 83 systolic, manual 899/39 by me. Remains asymptomatic. Will give another 250 cc bolus now with serial BP after.  2nd addendum: F/u BP 90/54. Remains asymptomatic from cardiac standpoint but does endorse feeling somewhat sleepy. A+Ox3, no focal neurologic deficits. Cath site remains stable. D/w Dr. Ladona. Will start IVF @ 122ml/hour with re-eval closer to 6pm to help guide dispo. Nurse updated of plan. I did re-evaluate patient again at 4pm, BP 96/54, remains clinically stable. Will sign out to on call APP.

## 2023-12-19 NOTE — Progress Notes (Signed)
 Pt was educated on stent card, stent location, Antiplatelet and ASA use, wt restrictions, no baths/daily wash-ups, s/s of infection, ex guidelines, s/s to stop exercising, NTG use and calling 911, heart healthy diet, risk factors and CRPII. Pt received materials on exercise, diet, and CRPII. Will refer to [facility].    Garen FORBES Candy MS, ACSM-CEP  12/19/2023 2:45 PM

## 2023-12-19 NOTE — Discharge Instructions (Signed)
 Radial Site Care Refer to this sheet in the next few weeks. These instructions provide you with information on caring for yourself after your procedure. Your caregiver may also give you more specific instructions. Your treatment has been planned according to current medical practices, but problems sometimes occur. Call your caregiver if you have any problems or questions after your procedure. HOME CARE INSTRUCTIONS You may shower the day after the procedure. Remove the bandage (dressing) and gently wash the site with plain soap and water . Gently pat the site dry.  Do not apply powder or lotion to the site.  Do not submerge the affected site in water  for 3 to 5 days.  Inspect the site at least twice daily.  Do not flex or bend the affected arm for 24 hours.  No lifting over 5 pounds (2.3 kg) for 1 week after your procedure.  Do not drive home if you are discharged the same day of the procedure. Have someone else drive you.  You may drive 2 days after the procedure unless otherwise instructed by your caregiver.  What to expect: Any bruising will usually fade within 1 to 2 weeks.  Blood that collects in the tissue (hematoma) may be painful to the touch. It should usually decrease in size and tenderness within 1 to 2 weeks.  SEEK IMMEDIATE MEDICAL CARE IF: You have unusual pain at the radial site.  You have redness, warmth, swelling, or pain at the radial site.  You have drainage (other than a small amount of blood on the dressing).  You have chills.  You have a fever or persistent symptoms for more than 72 hours.  You have a fever and your symptoms suddenly get worse.  Your arm becomes pale, cool, tingly, or numb.  You have heavy bleeding from the site. Hold pressure on the site.    PLEASE DO NOT MISS ANY DOSES OF YOUR PLAVIX !!!!! Also keep a log of your blood pressures and bring back to your follow up appt. Please call the office with any questions.   Patients taking blood thinners should  generally stay away from medicines like ibuprofen, Advil, Motrin, naproxen, and Aleve due to risk of stomach bleeding. You may take Tylenol  as directed or talk to your primary doctor about alternatives.  Some studies suggest Prilosec/Omeprazole  interacts with Plavix . Do not start taking this medication, speak with your PCP if you have concerns regarding reflux.  PLEASE ENSURE THAT YOU DO NOT RUN OUT OF YOUR PLAVIX . This medication is very important to remain on for at least 6months. IF you have issues obtaining this medication due to cost please CALL the office 3-5 business days prior to running out in order to prevent missing doses of this medication.

## 2023-12-19 NOTE — Discharge Summary (Signed)
 Discharge Summary for Same Day PCI   Patient ID: Brittany Lester MRN: [id number]; DOB: Mar 22, 1961  Admit date: 12/19/2023 Discharge date: 12/20/2023  Primary Care Provider: Carlette Benita Area, MD  Primary Cardiologist: Diannah SHAUNNA Maywood, MD  Primary Electrophysiologist:  None   Discharge Diagnoses    Principal Problem:   Accelerating angina [facility]) Active Problems:   Hypercholesterolemia   Essential hypertension   Current smoker   CAD (coronary artery disease)   Elevated coronary artery calcium  score   Hypotension after procedure    Diagnostic Studies/Procedures    Cardiac Catheterization 12/20/2023:  Cardiac Catheterization 12/19/23: Hemodynamic data: EDP 18 mmHg.  No pressure gradient across aortic valve.   Angiographic data: Left dominant system. LM: Moderate caliber vessel.  Appears normal. LCx: Dominant.  Mid segment has mild 20 to 30% stenosis followed by mid to distal 20 to 30% stenosis.  Otherwise smooth and normal, mild proximal coronary calcification is evident. LAD: Has acute band at its origin giving a 50 to 80% stenosis like appearance.  Virtual FFR 0.89, RFR 0.88, not hemodynamically significant.  Midsegment the LAD had a 60% stenosis and mildly calcified after origin of D2.  RFR was positive at 0.76 (performed due to patient's class III angina in spite of medical therapy).  Hemodynamically significant stenosis. RCA: Nondominant and small caliber vessel with mild disease in the midsegment.   Intervention data: Successful PTCA and stenting of the mid LAD with implantation of a 2.75 x 16 mm Synergy DES, stenosis reduced from 80% to 0% with TIMI-3 to TIMI-3 flow.      Impression and recommendations: Aspirin  indefinitely and Plavix  for 6 months.  Can be discharged home, will discontinue Ranexa  in view of revascularization. _____________   History of Present Illness     Brittany Lester is a 63 y.o. female with hypertension, hyperlipidemia, and tobacco  use who presented outpatient initially for DOE and chest pain for several years. A CCTA was obtained 09/2023 which showed elevated Ca score of 205 and possible flow limiting lesion in the mid LAD and small PFO. Given imaging findings and worsening symptom burden a cardiac catheterization was arranged for further evaluation. She was also empirically started on Ranexa  and Lasix .  Hospital Course     The patient underwent cardiac cath as noted above with DES to the mid LAD. Plan for DAPT with ASA/plavix  for at least 6 months. Her ranexa  was discontinued due to revascularization, and her lasix  was discontinued as her symptom burden etiology was determined to be coronary in nature. The patient was seen by cardiac rehab while in short stay. She did develop asymptomatic hypotension post procedure felt related to sedation. She was treated with IV fluids and improved. After observation period was completed she remained hemodynamically stable and felt well. The call APP Anglea Duke  discussed case with Dr. Ladona who felt she was ready for discharge. Radial cath site to be evaluated prior to discharge. Otherwise, instructions/precautions regarding cath site care were given prior to discharge.  Follow up with our office has been arranged. Medications are listed below. Pertinent changes include Start plavix  and stop ranexa  and lasix .     _____________  Cath/PCI Registry Performance & Quality Measures: Aspirin  prescribed? - Yes ADP Receptor Inhibitor (Plavix /Clopidogrel , Brilinta/Ticagrelor or Effient/Prasugrel) prescribed (includes medically managed patients)? - Yes High Intensity Statin (Lipitor 40-80mg  or Crestor 20-40mg ) prescribed? - Yes For EF <40%, was ACEI/ARB prescribed? - Not Applicable (EF >/= 40%) For EF <40%, Aldosterone Antagonist (Spironolactone or Eplerenone) prescribed? -  Not Applicable (EF >/= 40%) Cardiac Rehab Phase II ordered (Included Medically managed Patients)? -  Yes  _____________   Discharge Vitals Blood pressure 105/74, pulse (!) 59, temperature 97.6 F (36.4 C), temperature source Oral, resp. rate 13, height 5' 2 (1.575 m), weight 36.3 kg, SpO2 98%.  Filed Weights   12/19/23 0952  Weight: 36.3 kg    Last Labs & Radiologic Studies    CBC No results for input(s): WBC, NEUTROABS, HGB, HCT, MCV, PLT in the last 72 hours. Basic Metabolic Panel No results for input(s): NA, K, CL, CO2, GLUCOSE, BUN, CREATININE, CALCIUM , MG, PHOS in the last 72 hours. Liver Function Tests No results for input(s): AST, ALT, ALKPHOS, BILITOT, PROT, ALBUMIN in the last 72 hours. No results for input(s): LIPASE, AMYLASE in the last 72 hours. High Sensitivity Troponin:   No results for input(s): TROPONINIHS in the last 720 hours.  BNP Invalid input(s): POCBNP D-Dimer No results for input(s): DDIMER in the last 72 hours. Hemoglobin A1C No results for input(s): HGBA1C in the last 72 hours. Fasting Lipid Panel No results for input(s): CHOL, HDL, LDLCALC, TRIG, CHOLHDL, LDLDIRECT in the last 72 hours. Thyroid  Function Tests No results for input(s): TSH, T4TOTAL, T3FREE, THYROIDAB in the last 72 hours.  Invalid input(s): FREET3 _____________  CARDIAC CATHETERIZATION Result Date: 12/19/2023 Images from the original result were not included. Cardiac Catheterization 12/19/23: Hemodynamic data: EDP 18 mmHg.  No pressure gradient across aortic valve. Angiographic data: Left dominant system. LM: Moderate caliber vessel.  Appears normal. LCx: Dominant.  Mid segment has mild 20 to 30% stenosis followed by mid to distal 20 to 30% stenosis.  Otherwise smooth and normal, mild proximal coronary calcification is evident. LAD: Has acute band at its origin giving a 50 to 80% stenosis like appearance.  Virtual FFR 0.89, RFR 0.88, not hemodynamically significant.  Midsegment the LAD had a 60% stenosis and  mildly calcified after origin of D2.  RFR was positive at 0.76 (performed due to patient's class III angina in spite of medical therapy).  Hemodynamically significant stenosis. RCA: Nondominant and small caliber vessel with mild disease in the midsegment. Intervention data: Successful PTCA and stenting of the mid LAD with implantation of a 2.75 x 16 mm Synergy DES, stenosis reduced from 80% to 0% with TIMI-3 to TIMI-3 flow. Impression and recommendations: Aspirin  indefinitely and Plavix  for 6 months.  Can be discharged home, will discontinue Ranexa  in view of revascularization.    Disposition   Pt is being discharged home today in good condition.  Follow-up Plans & Appointments   01/10/24 with Dr. Mallipeddi  Discharge Instructions     Amb Referral to Cardiac Rehabilitation   Complete by: As directed    Diagnosis: Coronary Stents   After initial evaluation and assessments completed: Virtual Based Care may be provided alone or in conjunction with Phase 2 Cardiac Rehab based on patient barriers.: Yes   Intensive Cardiac Rehabilitation (ICR) [facility] location only OR Traditional Cardiac Rehabilitation (TCR) *If criteria for ICR are not met will enroll in TCR [facility] only): Yes      Discharge instructions are found on AVS.  Discharge Medications   Allergies as of 12/19/2023       Reactions   Erythromycin Other (See Comments)    GI upset        Medication List     STOP taking these medications    furosemide  20 MG tablet Commonly known as: Lasix    ranolazine  500 MG 12 hr tablet  Commonly known as: RANEXA        TAKE these medications    acetaminophen  650 MG CR tablet Commonly known as: TYLENOL  Take 650-1,300 mg by mouth every 8 (eight) hours as needed for pain or fever.   albuterol  108 (90 Base) MCG/ACT inhaler Commonly known as: VENTOLIN  HFA Inhale 1-2 puffs into the lungs every 4 (four) hours as needed for wheezing or shortness of breath.   albuterol  0.63 MG/3ML nebulizer  solution Commonly known as: ACCUNEB  Take 1 ampule by nebulization every 6 (six) hours as needed for shortness of breath or wheezing.   aspirin  EC 81 MG tablet Take 1 tablet (81 mg total) by mouth daily. Swallow whole.   atorvastatin  40 MG tablet Commonly known as: LIPITOR Take 1 tablet (40 mg total) by mouth daily. What changed: when to take this   bismuth subsalicylate 262 MG/15ML suspension Commonly known as: PEPTO BISMOL Take 30 mLs by mouth every 6 (six) hours as needed for indigestion or diarrhea or loose stools.   Breo Ellipta  100-25 MCG/INH Aepb Generic drug: fluticasone  furoate-vilanterol Inhale 1 puff into the lungs in the morning.   clopidogrel  75 MG tablet Commonly known as: Plavix  Take 1 tablet (75 mg total) by mouth daily.   DULoxetine  60 MG capsule Commonly known as: CYMBALTA  Take 60 mg by mouth at bedtime.   gabapentin 600 MG tablet Commonly known as: NEURONTIN Take 600 mg by mouth 3 (three) times daily.   hydrOXYzine  25 MG tablet Commonly known as: ATARAX  Take 25 mg by mouth 3 (three) times daily as needed for anxiety.   metoprolol  tartrate 25 MG tablet Commonly known as: LOPRESSOR  Take 1 tablet (25 mg total) by mouth 2 (two) times daily.   nitroGLYCERIN  0.4 MG SL tablet Commonly known as: Nitrostat  Place 1 tablet (0.4 mg total) under the tongue every 5 (five) minutes x 3 doses as needed (if no relief after 3rd dose proceed to ED or call 911).   ROLAIDS ADVANCED PO Take 1 Dose by mouth as needed (Indigestion).           Allergies Allergies  Allergen Reactions   Erythromycin Other (See Comments)     GI upset    Outstanding Labs/Studies   None  Duration of Discharge Encounter   Greater than 30 minutes including physician time.  Signed, Leontine LOISE Salen, PA-C 12/20/2023, 7:53 AM

## 2023-12-20 ENCOUNTER — Telehealth: Payer: Self-pay | Admitting: Internal Medicine

## 2023-12-20 ENCOUNTER — Encounter (HOSPITAL_COMMUNITY): Payer: Self-pay | Admitting: Cardiology

## 2023-12-20 DIAGNOSIS — I9581 Postprocedural hypotension: Secondary | ICD-10-CM | POA: Insufficient documentation

## 2023-12-20 MED FILL — Verapamil HCl IV Soln 2.5 MG/ML: INTRAVENOUS | Qty: 2 | Status: AC

## 2023-12-20 MED FILL — Clopidogrel Bisulfate Tab 300 MG (Base Equiv): ORAL | Qty: 2 | Status: AC

## 2023-12-20 MED FILL — Nitroglycerin IV Soln 100 MCG/ML in D5W: INTRA_ARTERIAL | Qty: 10 | Status: AC

## 2023-12-20 NOTE — Telephone Encounter (Signed)
 Pt c/o medication issue:  1. Name of Medication:   aspirin  EC 81 MG tablet    clopidogrel  (PLAVIX ) 75 MG tablet   2. How are you currently taking this medication (dosage and times per day)? As written  3. Are you having a reaction (difficulty breathing--STAT)? No   4. What is your medication issue?  Does she need to take both of these? Pt said you can send her a my chart message.

## 2023-12-21 ENCOUNTER — Encounter: Payer: Self-pay | Admitting: Internal Medicine

## 2023-12-25 DIAGNOSIS — I10 Essential (primary) hypertension: Secondary | ICD-10-CM | POA: Diagnosis not present

## 2024-01-01 ENCOUNTER — Encounter (HOSPITAL_COMMUNITY): Payer: Self-pay

## 2024-01-08 ENCOUNTER — Encounter (HOSPITAL_COMMUNITY): Payer: Self-pay

## 2024-01-09 ENCOUNTER — Encounter (HOSPITAL_COMMUNITY)
Admission: RE | Admit: 2024-01-09 | Discharge: 2024-01-09 | Disposition: A | Discharge status: Home / Self Care | Source: Ambulatory Visit | Attending: Internal Medicine | Admitting: Internal Medicine

## 2024-01-09 DIAGNOSIS — Z955 Presence of coronary angioplasty implant and graft: Secondary | ICD-10-CM | POA: Insufficient documentation

## 2024-01-09 NOTE — Progress Notes (Signed)
 Virtual orientation visit completed for cardiac rehab with stent placement. On-site orientation visit scheduled for 01/14/24 at 1 pm.

## 2024-01-10 ENCOUNTER — Ambulatory Visit: Attending: Internal Medicine | Admitting: Internal Medicine

## 2024-01-10 ENCOUNTER — Encounter: Payer: Self-pay | Admitting: Internal Medicine

## 2024-01-10 ENCOUNTER — Telehealth: Payer: Self-pay | Admitting: Internal Medicine

## 2024-01-10 VITALS — BP 132/80 | HR 84 | Ht 62.0 in | Wt 80.0 lb

## 2024-01-10 DIAGNOSIS — R5383 Other fatigue: Secondary | ICD-10-CM | POA: Diagnosis not present

## 2024-01-10 DIAGNOSIS — R0609 Other forms of dyspnea: Secondary | ICD-10-CM

## 2024-01-10 NOTE — Progress Notes (Signed)
 Cardiology Office Note  Date: 01/10/2024   ID: Brittany Lester, DOB 09-Apr-1961, MRN [id number]  PCP:  Carlette Benita Area, MD  Cardiologist:  Rachelanne Whidby P Akito Boomhower, MD Electrophysiologist:  None   History of Present Illness: Brittany Lester is a 63 y.o. female known to have HTN, HLD is here for follow-up visit.  Echocardiogram in Sep 07, 2023 showed normal LVEF, normal diastology, normal RV function, mild MR, no valvular heart disease, CVP 3 mmHg.  CT cardiac in May 2025 showed coronary calcium  score of 204, 92nd percentile for age and sex matched control.  Total plaque volume is severe.  Severe atherosclerosis present, 70 to 99% mid LAD, 50 to 69% mid to distal LAD stenosis, 25 to 49% distal LM/mid LCx stenosis. FFR demonstrated possible flow-limiting lesion in the mid LAD.  Due to progressive DOE and angina (accelerating angina), she underwent LHC s/p mid LAD PCI.  She is here today for follow-up visit.  Patient's chest pain is completely resolved after LAD PCI however her shortness of breath did not.  She was told she had COPD.  She says she will need to make an appointment with PCP to investigate her SOB.  Does not have any symptoms of dizziness, syncope, palpitations, leg swelling.  Past Medical History:  Diagnosis Date   Atrophic vaginitis 07/24/2014   Bipolar disorder (HCC)    BV (bacterial vaginosis) 07/24/2014   COPD (chronic obstructive pulmonary disease) (HCC)    Hepatitis C    Harvoni   Hepatitis C    Herpes    Hyperlipidemia    Hypertension    LGSIL of cervix of undetermined significance 06/24/2020   2/22 will get Colpo   Lupus    ???   Medical history non-contributory    Stroke [facility])    right sided weakness   Vaginal discharge 07/24/2014    Past Surgical History:  Procedure Laterality Date   BIOPSY  11/17/2022   Procedure: BIOPSY;  Surgeon: Cindie Carlin POUR, DO;  Location: [facility] ENDO SU[facility];  Service: Endoscopy;;   c section  12/18/1993   CESAREAN SECTION      COLONOSCOPY N/A 12/24/2013   DOQ:Wnmfjo mucosa in the terminal ileum/TWO colon polyps removed/ The LEFT colon IS redundant/Small internal hemorrhoids. tubular adenoma. next TCS 12/2018   COLONOSCOPY WITH PROPOFOL  N/A 11/17/2022   Procedure: COLONOSCOPY WITH PROPOFOL ;  Surgeon: Cindie Carlin POUR, DO;  Location: [facility] END[facility];  Service: Endoscopy;  Laterality: N/A;  10:45 am, asa 3   CORONARY PRESSURE/FFR WITH 3D MAPPING N/A 12/19/2023   Procedure: Coronary Pressure/FFR w/3D Mapping;  Surgeon: Ladona Heinz, MD;  Location: [facility] INVASIVE CV LAB;  Service: Cardiovascular;  Laterality: N/A;   CORONARY STENT INTERVENTION N/A 12/19/2023   Procedure: CORONARY STENT INTERVENTION;  Surgeon: Ladona Heinz, MD;  Location: [facility] INVASIVE CV LAB;  Service: Cardiovascular;  Laterality: N/A;   ESOPHAGOGASTRODUODENOSCOPY N/A 02/06/2014   SLF: 1. No obvious reason for weight loss identified 2. Dyspepsia due to MILD erosive gastritis.   ESOPHAGOGASTRODUODENOSCOPY (EGD) WITH PROPOFOL  N/A 11/17/2022   Procedure: ESOPHAGOGASTRODUODENOSCOPY (EGD) WITH PROPOFOL ;  Surgeon: Cindie Carlin POUR, DO;  Location[facility];  Service: Endoscopy;  Laterality: N/A;   FRACTURE SURGERY Right    20 years ago   LEFT HEART CATH AND CORONARY ANGIOGRAPHY N/A 12/19/2023   Procedure: LEFT HEART CATH AND CORONARY ANGIOGRAPHY;  Surgeon: Ladona Heinz, MD;  Location[facility] INVAS[facility] CV LAB;  Service: Cardiovascular;  Laterality: N/A;   POLYPECTOMY  11/17/2022   Procedure: POLYPECTOMY;  Surgeon:  Cindie Carlin POUR, DO;  Location[facility];  Service: Endoscopy;;    Current Outpatient Medications  Medication Sig Dispense Refill   acetaminophen  (TYLENOL ) 650 MG CR tablet Take 650-1,300 mg by mouth every 8 (eight) hours as needed for pain or fever.     albuterol  (ACCUNEB ) 0.63 MG/3ML nebulizer solution Take 1 ampule by nebulization every 6 (six) hours as needed for shortness of breath or wheezing.     albuterol  (VENTOLIN  HFA) 108 (90 Base) MCG/ACT inhaler Inhale  1-2 puffs into the lungs every 4 (four) hours as needed for wheezing or shortness of breath.     aspirin  EC 81 MG tablet Take 1 tablet (81 mg total) by mouth daily. Swallow whole. 30 tablet 11   atorvastatin  (LIPITOR) 40 MG tablet Take 1 tablet (40 mg total) by mouth daily. 30 tablet 1   bismuth subsalicylate (PEPTO BISMOL) 262 MG/15ML suspension Take 30 mLs by mouth every 6 (six) hours as needed for indigestion or diarrhea or loose stools.     BREO ELLIPTA  100-25 MCG/INH AEPB Inhale 1 puff into the lungs in the morning.     Cal Carb-Mag Hydrox-Simeth (ROLAIDS ADVANCED PO) Take 1 Dose by mouth as needed (Indigestion).     clopidogrel  (PLAVIX ) 75 MG tablet Take 1 tablet (75 mg total) by mouth daily. 90 tablet 3   DULoxetine  (CYMBALTA ) 60 MG capsule Take 60 mg by mouth at bedtime.     gabapentin (NEURONTIN) 600 MG tablet Take 600 mg by mouth 3 (three) times daily.     hydrOXYzine  (ATARAX ) 25 MG tablet Take 25 mg by mouth 3 (three) times daily as needed for anxiety.     metoprolol  tartrate (LOPRESSOR ) 25 MG tablet Take 1 tablet (25 mg total) by mouth 2 (two) times daily. 180 tablet 3   nitroGLYCERIN  (NITROSTAT ) 0.4 MG SL tablet Place 1 tablet (0.4 mg total) under the tongue every 5 (five) minutes x 3 doses as needed (if no relief after 3rd dose proceed to ED or call 911). 25 tablet 2   No current facility-administered medications for this visit.   Allergies:  Erythromycin   Social History: The patient  reports that she has been smoking cigarettes. She has a 10 pack-year smoking history. She has never used smokeless tobacco. She reports that she does not currently use alcohol . She reports that she does not use drugs.   Family History: The patient's family history includes Alcohol  abuse in her father; Bipolar disorder in her brother and sister; Cancer in her sister; Colon cancer in her maternal aunt; Depression in her father; Drug abuse in her brother, sister, and son; Heart attack in her father and  mother; Heart disease in her sister; Thyroid  disease in her mother and sister.   ROS:  Please see the history of present illness. Otherwise, complete review of systems is positive for none.  All other systems are reviewed and negative.   Physical Exam: VS:  There were no vitals taken for this visit., BMI There is no height or weight on file to calculate BMI.  Wt Readings from Last 3 Encounters:  12/19/23 80 lb (36.3 kg)  12/10/23 79 lb (35.8 kg)  08/06/23 80 lb (36.3 kg)    General: Patient appears comfortable at rest. HEENT: Conjunctiva and lids normal, oropharynx clear with moist mucosa. Neck: Supple, no elevated JVP or carotid bruits, no thyromegaly. Lungs: Clear to auscultation, nonlabored breathing at rest. Cardiac: Regular rate and rhythm, no S3 or significant systolic murmur, no pericardial  rub. Abdomen: Soft, nontender, no hepatomegaly, bowel sounds present, no guarding or rebound. Extremities: No pitting edema, distal pulses 2+. Skin: Warm and dry. Musculoskeletal: No kyphosis. Neuropsychiatric: Alert and oriented x3, affect grossly appropriate.  Recent Labwork: 12/14/2023: BUN 18; Creatinine, Ser 1.08; Hemoglobin 14.1; Platelets 219; Potassium 3.9; Sodium 140     Component Value Date/Time   CHOL 274 (H) 11/26/2020 0428   TRIG 107 11/26/2020 0428   HDL 79 11/26/2020 0428   CHOLHDL 3.5 11/26/2020 0428   VLDL 21 11/26/2020 0428   LDLCALC 174 (H) 11/26/2020 0428     Assessment and Plan:  Stable ischemic heart disease s/p mid LAD PCI Fatigue - s/p mid LAD PCI on 12/19/2023 for progressive worsening of DOE and angina (accelerating angina).  She also had positive CCTA findings, elevated coronary calcium  score of 204, 96% for age and sex matched control, severe total plaque volume, possible flow-limiting lesion in the mid LAD.  Echo normal. - Chest pain completely resolved after PCI but DOE did not.  Strongly encouraged patient to follow-up with PCP for evaluation of DOE. -  Continue DAPT for 6 months.  Continue aspirin  81 mg once daily, Plavix  75 mg once daily. - Continue atorvastatin  40 mg nightly. - Patient reported having symptoms of fatigue.  Will stop metoprolol  (this did not improve her chest pains previously).  She was started ranolazine  but after her LHC, IC discontinued ranolazine  due to revascularization.  HLD, not at goal - Continue atorvastatin  40 mg at bedtime. - LDL 174 in 2022.  Repeat lipid panel.  History of CVA - Patient had CVA with residual left-sided hemiparesis, but still can do activities. - Continue cardioprotective medications as above.  Nicotine  abuse - Smokes 1 cigarettes/day (cut back from 2 cigarettes daily).  Congratulated.  I spent around 30 minutes discussing the prior echo, LHC, CAD management, with the patient.  Answered all questions.   Medication Adjustments/Labs and Tests Ordered: Current medicines are reviewed at length with the patient today.  Concerns regarding medicines are outlined above.    Disposition:  Follow up 6 months  Signed, Aldena Worm Arleta Maywood, MD, 01/10/2024 11:14 AM    [facility] [address] [place], [place] [address]

## 2024-01-10 NOTE — Patient Instructions (Addendum)
 Medication Instructions:  Your physician has recommended you make the following change in your medication:  Stop taking Metoprolol   Continue taking all other medications as prescribed  Labwork: None  Testing/Procedures: None  Follow-Up: Your physician recommends that you schedule a follow-up appointment in: 6 months  Any Other Special Instructions Will Be Listed Below (If Applicable). Thank you for choosing [facility]!     If you need a refill on your cardiac medications before your next appointment, please call your pharmacy.

## 2024-01-10 NOTE — Telephone Encounter (Signed)
 Per Dr. Mallipeddi:    Ideally yes but if she can exercise at home or gym, it is also fine. She will benefit from pulmonary rehab at this time, can place referral due to DOE.   Patient agreed to Pulmonary Rehab and will call and cancel her Cardiac rehab appointment. Patient verbalized understanding

## 2024-01-10 NOTE — Telephone Encounter (Signed)
 Pt would like to know if cardiac rehab is still needed.

## 2024-01-14 ENCOUNTER — Telehealth: Payer: Self-pay

## 2024-01-14 ENCOUNTER — Encounter (HOSPITAL_COMMUNITY)
Admission: RE | Admit: 2024-01-14 | Discharge: 2024-01-14 | Disposition: A | Discharge status: Home / Self Care | Source: Ambulatory Visit | Attending: Internal Medicine | Admitting: Internal Medicine

## 2024-01-14 MED ORDER — AMLODIPINE BESYLATE 5 MG PO TABS: 5.0000 mg | ORAL_TABLET | Freq: Two times a day (BID) | ORAL | 3 refills | Status: DC

## 2024-01-14 NOTE — Telephone Encounter (Signed)
 Secure chat sent from cardiac rehab to Dr.Mallipeddi from Adrien Louder RN,   Hey Dr. Mallipeddi, Ms. Calef is here for her cardiac rehab orientation. Her blood pressure is 160/118, rechecked 10 minutes 160/110. She says her blood pressure is usually high but it was 132/80 when she say you.      Per Dr.Mallipeddi, start amlodipine  5 mg bid, Reschedule rehab in 2 weeks . [facility]

## 2024-01-14 NOTE — Progress Notes (Addendum)
 Incomplete Session Note  Patient Details  Name: Brittany Lester MRN: [id number] Date of Birth: 20-May-1960 Referring Provider:    [name] was here for her cardiac rehab orientation visit.  She was not able to do 6-minute walk-test due to HTN. Blood pressure checked multiple time 160/110 was the lowest reading. Dr. Mallipeddi notified. She added Amlodipine  5 mg BID and said to reschedule cardiac rehab in 2 weeks. Patient verbalized understanding. We will call and reschedule.

## 2024-01-16 ENCOUNTER — Encounter (HOSPITAL_COMMUNITY): Payer: Self-pay | Admitting: *Deleted

## 2024-01-16 DIAGNOSIS — Z955 Presence of coronary angioplasty implant and graft: Secondary | ICD-10-CM

## 2024-01-16 NOTE — Progress Notes (Signed)
 Cardiac Individual Treatment Plan  Patient Details  Name: Brittany Lester MRN: [id number] Date of Birth: 12-Apr-1961 Referring Provider:    Initial Encounter Date:   Visit Diagnosis: Status post coronary artery stent placement  Patient's Home Medications on Admission:  Current Outpatient Medications:    acetaminophen  (TYLENOL ) 650 MG CR tablet, Take 650-1,300 mg by mouth every 8 (eight) hours as needed for pain or fever., Disp: , Rfl:    albuterol  (ACCUNEB ) 0.63 MG/3ML nebulizer solution, Take 1 ampule by nebulization every 6 (six) hours as needed for shortness of breath or wheezing., Disp: , Rfl:    albuterol  (VENTOLIN  HFA) 108 (90 Base) MCG/ACT inhaler, Inhale 1-2 puffs into the lungs every 4 (four) hours as needed for wheezing or shortness of breath., Disp: , Rfl:    amLODipine  (NORVASC ) 5 MG tablet, Take 1 tablet (5 mg total) by mouth 2 (two) times daily., Disp: 180 tablet, Rfl: 3   aspirin  EC 81 MG tablet, Take 1 tablet (81 mg total) by mouth daily. Swallow whole., Disp: 30 tablet, Rfl: 11   atorvastatin  (LIPITOR) 40 MG tablet, Take 1 tablet (40 mg total) by mouth daily., Disp: 30 tablet, Rfl: 1   bismuth subsalicylate (PEPTO BISMOL) 262 MG/15ML suspension, Take 30 mLs by mouth every 6 (six) hours as needed for indigestion or diarrhea or loose stools., Disp: , Rfl:    BREO ELLIPTA  100-25 MCG/INH AEPB, Inhale 1 puff into the lungs in the morning., Disp: , Rfl:    Cal Carb-Mag Hydrox-Simeth (ROLAIDS ADVANCED PO), Take 1 Dose by mouth as needed (Indigestion)., Disp: , Rfl:    clopidogrel  (PLAVIX ) 75 MG tablet, Take 1 tablet (75 mg total) by mouth daily., Disp: 90 tablet, Rfl: 3   DULoxetine  (CYMBALTA ) 60 MG capsule, Take 60 mg by mouth at bedtime., Disp: , Rfl:    gabapentin (NEURONTIN) 600 MG tablet, Take 600 mg by mouth 3 (three) times daily., Disp: , Rfl:    hydrOXYzine  (ATARAX ) 25 MG tablet, Take 25 mg by mouth 3 (three) times daily as needed for anxiety., Disp: , Rfl:    nitroGLYCERIN   (NITROSTAT ) 0.4 MG SL tablet, Place 1 tablet (0.4 mg total) under the tongue every 5 (five) minutes x 3 doses as needed (if no relief after 3rd dose proceed to ED or call 911)., Disp: 25 tablet, Rfl: 2  Past Medical History: Past Medical History:  Diagnosis Date   Atrophic vaginitis 07/24/2014   Bipolar disorder (HCC)    BV (bacterial vaginosis) 07/24/2014   COPD (chronic obstructive pulmonary disease) (HCC)    Hepatitis C    Harvoni   Hepatitis C    Herpes    Hyperlipidemia    Hypertension    LGSIL of cervix of undetermined significance 06/24/2020   2/22 will get Colpo   Lupus    ???   Medical history non-contributory    Stroke [facility])    right sided weakness   Vaginal discharge 07/24/2014    Tobacco Use: Social History   Tobacco Use  Smoking Status Every Day   Current packs/day: 0.25   Average packs/day: 0.3 packs/day for 40.0 years (10.0 ttl pk-yrs)   Types: Cigarettes  Smokeless Tobacco Never    Labs: Review Flowsheet       Latest Ref Rng & Units 12/10/2007 11/25/2020 11/26/2020  Labs for ITP Cardiac and Pulmonary Rehab  Cholestrol 0 - 200 mg/dL 826  - 725   LDL (calc) 0 - 99 mg/dL 89  - 825   HDL-C >59 mg/dL  60  - 79   Trlycerides <150 mg/dL 877  - 892   Hemoglobin A1c 4.8 - 5.6 % - - 5.4   TCO2 22 - 32 mmol/L - 26  -    Capillary Blood Glucose: Lab Results  Component Value Date   GLUCAP 131 (H) 12/19/2023     Exercise Target Goals: Exercise Program Goal: Individual exercise prescription set using results from initial 6 min walk test and THRR while considering  patient's activity barriers and safety.   Exercise Prescription Goal: Starting with aerobic activity 30 plus minutes a day, 3 days per week for initial exercise prescription. Provide home exercise prescription and guidelines that participant acknowledges understanding prior to discharge.  Activity Barriers & Risk Stratification:  Activity Barriers & Cardiac Risk Stratification - 01/09/24 1307        Activity Barriers & Cardiac Risk Stratification   Activity Barriers Shortness of Breath;History of Falls;Balance Concerns;Arthritis;Deconditioning   OA hands/knees. Some right sided weakness after CVA.   Cardiac Risk Stratification Moderate          6 Minute Walk:   Oxygen  Initial Assessment:   Oxygen  Re-Evaluation:   Oxygen  Discharge (Final Oxygen  Re-Evaluation):   Initial Exercise Prescription:   Perform Capillary Blood Glucose checks as needed.  Exercise Prescription Changes:   Exercise Comments:   Exercise Goals and Review:   Exercise Goals Re-Evaluation :    Discharge Exercise Prescription (Final Exercise Prescription Changes):   Nutrition:  Target Goals: Understanding of nutrition guidelines, daily intake of sodium 1500mg , cholesterol 200mg , calories 30% from fat and 7% or less from saturated fats, daily to have 5 or more servings of fruits and vegetables.  Biometrics:    Nutrition Therapy Plan and Nutrition Goals:   Nutrition Assessments:  MEDIFICTS Score Key: >=70 Need to make dietary changes  40-70 Heart Healthy Diet <= 40 Therapeutic Level Cholesterol Diet  Flowsheet Row CARDIAC VIRTUAL BASED CARE from 01/09/2024 in [facility] CARDIAC REHABILITATION  Picture Your Plate Total Score on Admission 39   Picture Your Plate Scores: <59 Unhealthy dietary pattern with much room for improvement. 41-50 Dietary pattern unlikely to meet recommendations for good health and room for improvement. 51-60 More healthful dietary pattern, with some room for improvement.  >60 Healthy dietary pattern, although there may be some specific behaviors that could be improved.    Nutrition Goals Re-Evaluation:   Nutrition Goals Discharge (Final Nutrition Goals Re-Evaluation):   Psychosocial: Target Goals: Acknowledge presence or absence of significant depression and/or stress, maximize coping skills, provide positive support system. Participant is able  to verbalize types and ability to use techniques and skills needed for reducing stress and depression.  Initial Review & Psychosocial Screening:  Initial Psych Review & Screening - 01/09/24 1344       Initial Review   Current issues with Current Anxiety/Panic;Current Psychotropic Meds;Current Depression;Current Sleep Concerns   Bipolar/Schizophrenia/Personality disorder     Family Dynamics   Good Support System? Yes    Comments Patient's boyfriend and sister support her.      Barriers   Psychosocial barriers to participate in program The patient should benefit from training in stress management and relaxation.;Psychosocial barriers identified (see note)      Screening Interventions   Interventions To provide support and resources with identified psychosocial needs;Provide feedback about the scores to participant;Encouraged to exercise    Expected Outcomes Short Term goal: Utilizing psychosocial counselor, staff and physician to assist with identification of specific Stressors or current issues interfering with healing  process. Setting desired goal for each stressor or current issue identified.;Long Term Goal: Stressors or current issues are controlled or eliminated.;Short Term goal: Identification and review with participant of any Quality of Life or Depression concerns found by scoring the questionnaire.;Long Term goal: The participant improves quality of Life and PHQ9 Scores as seen by post scores and/or verbalization of changes          Quality of Life Scores:  Quality of Life - 01/09/24 1343       Quality of Life   Select Quality of Life      Quality of Life Scores   Health/Function Pre 17.8 %    Socioeconomic Pre 15.75 %    Psych/Spiritual Pre 18 %    Family Pre 24 %    GLOBAL Pre 18 %         Scores of 19 and below usually indicate a poorer quality of life in these areas.  A difference of  2-3 points is a clinically meaningful difference.  A difference of 2-3 points in  the total score of the Quality of Life Index has been associated with significant improvement in overall quality of life, self-image, physical symptoms, and general health in studies assessing change in quality of life.  PHQ-9: Review Flowsheet  More data may exist      01/14/2024 06/20/2023 06/14/2020 05/16/2016 04/26/2016  Depression screen PHQ 2/9  Decreased Interest 3 0 3 0 0  Down, Depressed, Hopeless 3 0 3 0 0  PHQ - 2 Score 6 0 6 0 0  Altered sleeping 2 2 2  - -  Tired, decreased energy 3 2 3  - -  Change in appetite 0 0 0 - -  Feeling bad or failure about yourself  0 0 1 - -  Trouble concentrating 2 2 1  - -  Moving slowly or fidgety/restless 1 0 0 - -  Suicidal thoughts 0 0 0 - -  PHQ-9 Score 14 6 13  - -  Difficult doing work/chores Somewhat difficult - - - -   Interpretation of Total Score  Total Score Depression Severity:  1-4 = Minimal depression, 5-9 = Mild depression, 10-14 = Moderate depression, 15-19 = Moderately severe depression, 20-27 = Severe depression   Psychosocial Evaluation and Intervention:  Psychosocial Evaluation - 01/09/24 1345       Psychosocial Evaluation & Interventions   Interventions Stress management education;Relaxation education;Encouraged to exercise with the program and follow exercise prescription    Comments Patient was referred to cardiac rehab with stent placement 12/19/23. She was diagnosed with bipolar disorder, borderline schizophrenia and personality disorder at 63 years old. She has anxiety and depression. She is currently on cymbalta . She says her psychosocial issues were managed with behavioral health here in [place] but she has not seen anyone about her mental health in several years and has not been on her meds because no one will prescribe. Informed her, we will provide some information for treatment at her orientation visit. She lives with her boyfriend. She says she stays in bed most of the time. She is not sure if this is due to fatigue  or her mental health issues. She is currently smoking 1 cigarette/day and vaps a few times/day. She does want to quit but not sure if she is ready. Her goals for the program are to get stronger and be able to do more activities and get motivated to be more active. She uses [employer] for transportation. She does not see any barriers to  completeing the program.    Expected Outcomes Short Term: Patient will start the program and attend consistently. Long Term: Patient will complete the program meeting personal goals.    Continue Psychosocial Services  Follow up required by staff          Psychosocial Re-Evaluation:   Psychosocial Discharge (Final Psychosocial Re-Evaluation):   Vocational Rehabilitation: Provide vocational rehab assistance to qualifying candidates.   Vocational Rehab Evaluation & Intervention:  Vocational Rehab - 01/09/24 1342       Initial Vocational Rehab Evaluation & Intervention   Assessment shows need for Vocational Rehabilitation No      Vocational Rehab Re-Evaulation   Comments Patient is disabled.          Education: Education Goals: Education classes will be provided on a weekly basis, covering required topics. Participant will state understanding/return demonstration of topics presented.  Learning Barriers/Preferences:  Learning Barriers/Preferences - 01/09/24 1341       Learning Barriers/Preferences   Learning Barriers Reading   Patient is able to read but says she does not comprehend what she reads.   Learning Preferences Audio;Skilled Demonstration          Education Topics: Hypertension, Hypertension Reduction -Define heart disease and high blood pressure. Discus how high blood pressure affects the body and ways to reduce high blood pressure.   Exercise and Your Heart -Discuss why it is important to exercise, the FITT principles of exercise, normal and abnormal responses to exercise, and how to exercise safely.   Angina -Discuss  definition of angina, causes of angina, treatment of angina, and how to decrease risk of having angina.   Cardiac Medications -Review what the following cardiac medications are used for, how they affect the body, and side effects that may occur when taking the medications.  Medications include Aspirin , Beta blockers, calcium  channel blockers, ACE Inhibitors, angiotensin receptor blockers, diuretics, digoxin, and antihyperlipidemics.   Congestive Heart Failure -Discuss the definition of CHF, how to live with CHF, the signs and symptoms of CHF, and how keep track of weight and sodium intake.   Heart Disease and Intimacy -Discus the effect sexual activity has on the heart, how changes occur during intimacy as we age, and safety during sexual activity.   Smoking Cessation / COPD -Discuss different methods to quit smoking, the health benefits of quitting smoking, and the definition of COPD.   Nutrition I: Fats -Discuss the types of cholesterol, what cholesterol does to the heart, and how cholesterol levels can be controlled.   Nutrition II: Labels -Discuss the different components of food labels and how to read food label   Heart Parts/Heart Disease and PAD -Discuss the anatomy of the heart, the pathway of blood circulation through the heart, and these are affected by heart disease.   Stress I: Signs and Symptoms -Discuss the causes of stress, how stress may lead to anxiety and depression, and ways to limit stress.   Stress II: Relaxation -Discuss different types of relaxation techniques to limit stress.   Warning Signs of Stroke / TIA -Discuss definition of a stroke, what the signs and symptoms are of a stroke, and how to identify when someone is having stroke.   Knowledge Questionnaire Score:   Core Components/Risk Factors/Patient Goals at Admission:  Personal Goals and Risk Factors at Admission - 01/09/24 1342       Core Components/Risk Factors/Patient Goals on  Admission    Weight Management Weight Loss    Tobacco Cessation Yes    Number  of packs per day 1    Intervention Assist the participant in steps to quit. Provide individualized education and counseling about committing to Tobacco Cessation, relapse prevention, and pharmacological support that can be provided by physician.;Education officer, environmental, assist with locating and accessing local/national Quit Smoking programs, and support quit date choice.    Expected Outcomes Short Term: Will demonstrate readiness to quit, by selecting a quit date.;Short Term: Will quit all tobacco product use, adhering to prevention of relapse plan.;Long Term: Complete abstinence from all tobacco products for at least 12 months from quit date.    Improve shortness of breath with ADL's Yes    Intervention Provide education, individualized exercise plan and daily activity instruction to help decrease symptoms of SOB with activities of daily living.    Expected Outcomes Short Term: Improve cardiorespiratory fitness to achieve a reduction of symptoms when performing ADLs;Long Term: Be able to perform more ADLs without symptoms or delay the onset of symptoms    Hypertension Yes    Intervention Provide education on lifestyle modifcations including regular physical activity/exercise, weight management, moderate sodium restriction and increased consumption of fresh fruit, vegetables, and low fat dairy, alcohol  moderation, and smoking cessation.;Monitor prescription use compliance.    Expected Outcomes Short Term: Continued assessment and intervention until BP is < 140/63mm HG in hypertensive participants. < 130/32mm HG in hypertensive participants with diabetes, heart failure or chronic kidney disease.;Long Term: Maintenance of blood pressure at goal levels.    Lipids Yes    Intervention Provide education and support for participant on nutrition & aerobic/resistive exercise along with prescribed medications to achieve LDL 70mg ,  HDL >40mg .    Expected Outcomes Short Term: Participant states understanding of desired cholesterol values and is compliant with medications prescribed. Participant is following exercise prescription and nutrition guidelines.;Long Term: Cholesterol controlled with medications as prescribed, with individualized exercise RX and with personalized nutrition plan. Value goals: LDL < 70mg , HDL > 40 mg.          Core Components/Risk Factors/Patient Goals Review:    Core Components/Risk Factors/Patient Goals at Discharge (Final Review):    ITP Comments:  ITP Comments     Row Name 01/09/24 1355 01/16/24 1524         ITP Comments Virtual orientation visit completed for cardiac rehab with stent placement. On-site orientation visit scheduled for 01/14/24 at 1pm. 30 day review completed. ITP sent to Dr. Dorn Ross, Medical Director of Cardiac Rehab. Continue with ITP unless changes are made by physician.  Has only attended orientation this week.         Comments: 30 day review

## 2024-01-17 ENCOUNTER — Encounter (HOSPITAL_COMMUNITY)

## 2024-01-18 NOTE — Telephone Encounter (Signed)
 Pt called in stating she tried get a blood pressure machine but her insurance requires an prescription for it. She asked how could that be done.    Pt also asked if it is safe to take Coricidin cold medicine with her other cardiac medications.

## 2024-01-21 MED ORDER — OMRON 7 SERIES BP MONITOR DEVI: 1.0000 [IU] | 0 refills | Status: DC

## 2024-01-21 NOTE — Addendum Note (Signed)
 Addended by: Natane Heward A on: 01/21/2024 01:53 PM   Modules accepted: Orders

## 2024-01-22 ENCOUNTER — Encounter (HOSPITAL_COMMUNITY)

## 2024-01-22 ENCOUNTER — Telehealth: Payer: Self-pay | Admitting: Internal Medicine

## 2024-01-22 MED ORDER — OMRON 7 SERIES BP MONITOR DEVI: 1.0000 [IU] | 0 refills | Status: AC

## 2024-01-22 NOTE — Telephone Encounter (Signed)
 Office is requesting a callback regarding them needing diagnosis for the BP monitor. Please advise

## 2024-01-22 NOTE — Telephone Encounter (Signed)
 BP cuff prescription sent to [facility] per pt's request.

## 2024-01-22 NOTE — Addendum Note (Signed)
 Addended by: KENETH KNEE C on: 01/22/2024 12:57 PM   Modules accepted: Orders

## 2024-01-22 NOTE — Telephone Encounter (Signed)
 [facility] contacted. Spoke with Olam and gave diagnosis of essential hypertension (I10).

## 2024-01-22 NOTE — Telephone Encounter (Signed)
 [facility] for her BP Cuff needs to be sent to [facility] - [place], [place] - [address] [place]

## 2024-01-24 ENCOUNTER — Encounter (HOSPITAL_COMMUNITY)

## 2024-01-29 ENCOUNTER — Encounter (HOSPITAL_COMMUNITY)

## 2024-01-29 ENCOUNTER — Telehealth: Payer: Self-pay | Admitting: Internal Medicine

## 2024-01-29 NOTE — Telephone Encounter (Signed)
 Pt c/o medication issue:  1. Name of Medication:   amLODipine  (NORVASC ) 5 MG tablet   2. How are you currently taking this medication (dosage and times per day)?  Take 1 tablet (5 mg total) by mouth 2 (two) times daily.     3. Are you having a reaction (difficulty breathing--STAT)? No  4. What is your medication issue? Pt is requesting a callback regarding this medication causing her to have headaches and dizziness. Please advise

## 2024-01-29 NOTE — Telephone Encounter (Signed)
 Patient called to stated that every since she started this medication (amlodipine ) she has had a headache the last 2 nights. She is very dizzy and lightheaded. Even when she lays down she is still dizzy. Her BP is still elevated. Took today @ 1:45 158/103 and last night @ 8:09 p.m 158/102. Advised her that when she is checking her BP need to wait at least 2 hours after she has had her medication before checking. She stated that she has. Gave ER precautions. Advised her that will send over to provider and will call her back once she responds. Patient verbalized understanding.

## 2024-01-30 MED ORDER — CHLORTHALIDONE 25 MG PO TABS: 25.0000 mg | ORAL_TABLET | Freq: Every day | ORAL | 5 refills | Status: AC

## 2024-01-30 MED ORDER — POTASSIUM CHLORIDE CRYS ER 20 MEQ PO TBCR: 20.0000 meq | EXTENDED_RELEASE_TABLET | Freq: Every day | ORAL | 5 refills | Status: AC

## 2024-01-30 NOTE — Telephone Encounter (Signed)
 Per Dr. Mallipeddi:  Hold amlodipine . Add it under allergies. Start Chlorthalidone  25 mg once daily. S/e include dizziness, hypokalemia. start KCL 20 mEq once daily.   Advised patient of changes. Verbalized understanding and will call us  back if she develops any new symptoms with these changes.

## 2024-01-30 NOTE — Telephone Encounter (Signed)
 Left message for patient to call back

## 2024-01-31 ENCOUNTER — Encounter (HOSPITAL_COMMUNITY)

## 2024-02-04 NOTE — Progress Notes (Unsigned)
 New Patient Pulmonology Office Visit   Subjective:  Patient ID: Brittany Lester, female    DOB: 01-13-1961  MRN: [id number]  Referred by: Carlette Benita Area*  CC: No chief complaint on file.   HPI Brittany Lester is a 63 y.o. female with a PMH significant for HTN, HLD, nicotine  dependence, and CAD s/p PCI to LAD (12/19/23) who presents for initial evaluation of dyspnea.  CTD Symptoms:   PMH:  - Hx of TB ***  - Hx of endemic fungal infections ***  - Hx of Sarcoidosis ***  - Hx of CTD ***  - Hx of lymphoma or other hematological malignancies ***  - Hx of irradiation to the chest or immunotherapy ***   PSH:   Medications:   Allergies:   Social Hx: - Smoking ***  - Second hand smoking ***  - Vaping ***  - Alcohol  ***  - Illicit substances ***  - Indoor emission from household combustion ***  - Hobbies (e.g. wood work, Surveyor, quantity, bird breeding etc...)  - Landscape architect (e.g. mold) ***  - Pets ***  - Birds ***   {Occupational Exposures:33652}  Family Hx:  - Lung disease ***  - CTD ***  - Sarcoidosis ***  - Cancer ***    {PULM QUESTIONNAIRES (Optional):33196}  ROS  Allergies: Amlodipine  and Erythromycin  Current Outpatient Medications:    acetaminophen  (TYLENOL ) 650 MG CR tablet, Take 650-1,300 mg by mouth every 8 (eight) hours as needed for pain or fever., Disp: , Rfl:    albuterol  (ACCUNEB ) 0.63 MG/3ML nebulizer solution, Take 1 ampule by nebulization every 6 (six) hours as needed for shortness of breath or wheezing., Disp: , Rfl:    albuterol  (VENTOLIN  HFA) 108 (90 Base) MCG/ACT inhaler, Inhale 1-2 puffs into the lungs every 4 (four) hours as needed for wheezing or shortness of breath., Disp: , Rfl:    aspirin  EC 81 MG tablet, Take 1 tablet (81 mg total) by mouth daily. Swallow whole., Disp: 30 tablet, Rfl: 11   atorvastatin  (LIPITOR) 40 MG tablet, Take 1 tablet (40 mg total) by mouth daily., Disp: 30 tablet, Rfl: 1   bismuth subsalicylate  (PEPTO BISMOL) 262 MG/15ML suspension, Take 30 mLs by mouth every 6 (six) hours as needed for indigestion or diarrhea or loose stools., Disp: , Rfl:    Blood Pressure Monitoring (OMRON 7 SERIES BP MONITOR) DEVI, 1 Units by Does not apply route as directed., Disp: 1 each, Rfl: 0   BREO ELLIPTA  100-25 MCG/INH AEPB, Inhale 1 puff into the lungs in the morning., Disp: , Rfl:    Cal Carb-Mag Hydrox-Simeth (ROLAIDS ADVANCED PO), Take 1 Dose by mouth as needed (Indigestion)., Disp: , Rfl:    chlorthalidone  (HYGROTON ) 25 MG tablet, Take 1 tablet (25 mg total) by mouth daily., Disp: 30 tablet, Rfl: 5   clopidogrel  (PLAVIX ) 75 MG tablet, Take 1 tablet (75 mg total) by mouth daily., Disp: 90 tablet, Rfl: 3   DULoxetine  (CYMBALTA ) 60 MG capsule, Take 60 mg by mouth at bedtime., Disp: , Rfl:    gabapentin (NEURONTIN) 600 MG tablet, Take 600 mg by mouth 3 (three) times daily., Disp: , Rfl:    hydrOXYzine  (ATARAX ) 25 MG tablet, Take 25 mg by mouth 3 (three) times daily as needed for anxiety., Disp: , Rfl:    nitroGLYCERIN  (NITROSTAT ) 0.4 MG SL tablet, Place 1 tablet (0.4 mg total) under the tongue every 5 (five) minutes x 3 doses as needed (if no relief after 3rd dose proceed to ED  or call 911)., Disp: 25 tablet, Rfl: 2   potassium chloride  SA (KLOR-CON  M) 20 MEQ tablet, Take 1 tablet (20 mEq total) by mouth daily., Disp: 30 tablet, Rfl: 5 Past Medical History:  Diagnosis Date   Atrophic vaginitis 07/24/2014   Bipolar disorder (HCC)    BV (bacterial vaginosis) 07/24/2014   COPD (chronic obstructive pulmonary disease) (HCC)    Hepatitis C    Harvoni   Hepatitis C    Herpes    Hyperlipidemia    Hypertension    LGSIL of cervix of undetermined significance 06/24/2020   2/22 will get Colpo   Lupus    ???   Medical history non-contributory    Stroke [facility])    right sided weakness   Vaginal discharge 07/24/2014   Past Surgical History:  Procedure Laterality Date   BIOPSY  11/17/2022   Procedure: BIOPSY;   Surgeon: Cindie Carlin POUR, DO;  Location: [facility];  Service: Endoscopy;;   c section  12/18/1993   CESAREAN SECTION     COLONOSCOPY N/A 12/24/2013   DOQ:Wnmfjo mucosa in the terminal ileum/TWO colon polyps removed/ The LEFT colon IS redundant/Small internal hemorrhoids. tubular adenoma. next TCS 12/2018   COLONOSCOPY WITH PROPOFOL  N/A 11/17/2022   Procedure: COLONOSCOPY WITH PROPOFOL ;  Surgeon: Cindie Carlin POUR, DO;  Location: [facility];  Service: Endoscopy;  Laterality: N/A;  10:45 am, asa 3   CORONARY PRESSURE/FFR WITH 3D MAPPING N/A 12/19/2023   Procedure: Coronary Pressure/FFR w/3D Mapping;  Surgeon: Ladona Heinz, MD;  Location: [facility];  Service: Cardiovascular;  Laterality: N/A;   CORONARY STENT INTERVENTION N/A 12/19/2023   Procedure: CORONARY STENT INTERVENTION;  Surgeon: Ladona Heinz, MD;  Location: [facility];  Service: Cardiovascular;  Laterality: N/A;   ESOPHAGOGASTRODUODENOSCOPY N/A 02/06/2014   SLF: 1. No obvious reason for weight loss identified 2. Dyspepsia due to MILD erosive gastritis.   ESOPHAGOGASTRODUODENOSCOPY (EGD) WITH PROPOFOL  N/A 11/17/2022   Procedure: ESOPHAGOGASTRODUODENOSCOPY (EGD) WITH PROPOFOL ;  Surgeon: Cindie Carlin POUR, DO;  [facility];  Service: Endoscopy;  Laterality: N/A;   FRACTURE SURGERY Right    20 years ago   LEFT HEART CATH AND CORONARY ANGIOGRAPHY N/A 12/19/2023   Procedure: LEFT HEART CATH AND CORONARY ANGIOGRAPHY;  Surgeon: Ladona Heinz, MD;  Location[facility];  Service: Cardiovascular;  Laterality: N/A;   POLYPECTOMY  11/17/2022   Procedure: POLYPECTOMY;  Surgeon: Cindie Carlin POUR, DO;  Location[facility];  Service: Endoscopy;;   Family History  Problem Relation Age of Onset   Colon cancer Maternal Aunt        age 63   Heart attack Mother    Thyroid  disease Mother    Heart attack Father    Alcohol  abuse Father    Depression Father    Heart disease Sister    Thyroid  disease Sister    Bipolar  disorder Sister    Cancer Sister        lung   Drug abuse Sister    Bipolar disorder Brother    Drug abuse Brother    Drug abuse Son    Liver disease Neg Hx    Social History   Socioeconomic History   Marital status: Significant Other    Spouse name: Not on file   Number of children: 2   Years of education: Not on file   Highest education level: Not on file  Occupational History   Not on file  Tobacco Use  Smoking status: Every Day    Current packs/day: 0.25    Average packs/day: 0.3 packs/day for 40.0 years (10.0 ttl pk-yrs)    Types: Cigarettes   Smokeless tobacco: Never  Vaping Use   Vaping status: Never Used  Substance and Sexual Activity   Alcohol  use: Not Currently    Comment: occasional. Previously drank beer daily. Now has mixed drinks with vodka every few months.   Drug use: No    Comment: H/O IVDU, quit 2006   Sexual activity: Yes    Birth control/protection: Post-menopausal  Other Topics Concern   Not on file  Social History Narrative   Not on file   Social Drivers of Health   Financial Resource Strain: High Risk (06/20/2023)   Overall Financial Resource Strain (CARDIA)    Difficulty of Paying Living Expenses: Hard  Food Insecurity: Food Insecurity Present (06/20/2023)   Hunger Vital Sign    Worried About Running Out of Food in the Last Year: Sometimes true    Ran Out of Food in the Last Year: Sometimes true  Transportation Needs: No Transportation Needs (06/20/2023)   PRAPARE - Administrator, Civil Service (Medical): No    Lack of Transportation (Non-Medical): No  Physical Activity: Insufficiently Active (06/20/2023)   Exercise Vital Sign    Days of Exercise per Week: 2 days    Minutes of Exercise per Session: 10 min  Stress: Stress Concern Present (06/20/2023)   [employer] of Occupational Health - Occupational Stress Questionnaire    Feeling of Stress : Rather much  Social Connections: Moderately Integrated (06/20/2023)   Social  Connection and Isolation Panel    Frequency of Communication with Friends and Family: More than three times a week    Frequency of Social Gatherings with Friends and Family: Never    Attends Religious Services: 1 to 4 times per year    Active Member of [employer] or Organizations: No    Attends Banker Meetings: Never    Marital Status: Living with partner  Intimate Partner Violence: Not At Risk (06/20/2023)   Humiliation, Afraid, Rape, and Kick questionnaire    Fear of Current or Ex-Partner: No    Emotionally Abused: No    Physically Abused: No    Sexually Abused: No       Objective:  There were no vitals taken for this visit. {Pulm Vitals (Optional):32837}  Physical Exam  Diagnostic Review:  {Labs (Optional):32838}  CTA Coronaries 09/2023: I reviewed lung windows independently and visualized portions of the lung are without infiltrates, nodularities, or any masses  TTE 09/2023: IMPRESSIONS 1. Left ventricular ejection fraction, by estimation, is 65 to 70%. Theleft ventricle has normal function. The left ventricle has no regionalwall motion abnormalities. Left ventricular diastolic parameters werenormal. 2. Right ventricular systolic function is normal. The right ventricularsize is normal. There is normal pulmonary artery systolic pressure. Theestimated right ventricular systolic pressure is 17.3 mmHg. 3. The mitral valve is degenerative. Mild mitral valve regurgitation. Noevidence of mitral stenosis. The mean mitral valve gradient is 2.0 mmHg. 4. The aortic valve is tricuspid. Aortic valve regurgitation is notvisualized. 5. The inferior vena cava is normal in size with greater than 50%respiratory variability, suggesting right atrial pressure of 3 mmHg.  SABRA [facility] 12/2023: Hemodynamic data: EDP 18 mmHg.  No pressure gradient across aortic valve.   Angiographic data: Left dominant system. LM: Moderate caliber vessel.  Appears normal. LCx: Dominant.  Mid segment has mild 20 to 30%  stenosis followed by  mid to distal 20 to 30% stenosis.  Otherwise smooth and normal, mild proximal coronary calcification is evident. LAD: Has acute band at its origin giving a 50 to 80% stenosis like appearance.  Virtual FFR 0.89, RFR 0.88, not hemodynamically significant.  Midsegment the LAD had a 60% stenosis and mildly calcified after origin of D2.  RFR was positive at 0.76 (performed due to patient's class III angina in spite of medical therapy).  Hemodynamically significant stenosis. RCA: Nondominant and small caliber vessel with mild disease in the midsegment.   Intervention data: Successful PTCA and stenting of the mid LAD with implantation of a 2.75 x 16 mm Synergy DES, stenosis reduced from 80% to 0% with TIMI-3 to TIMI-3 flow.    Assessment & Plan:   Assessment & Plan   No orders of the defined types were placed in this encounter.     No follow-ups on file.   Digby Groeneveld, MD

## 2024-02-05 ENCOUNTER — Encounter: Payer: Self-pay | Admitting: Pulmonary Disease

## 2024-02-05 ENCOUNTER — Encounter (HOSPITAL_COMMUNITY)

## 2024-02-05 ENCOUNTER — Ambulatory Visit (INDEPENDENT_AMBULATORY_CARE_PROVIDER_SITE_OTHER): Admitting: Pulmonary Disease

## 2024-02-05 VITALS — BP 155/91 | HR 84 | Ht 62.0 in | Wt 84.2 lb

## 2024-02-05 DIAGNOSIS — Z23 Encounter for immunization: Secondary | ICD-10-CM

## 2024-02-05 DIAGNOSIS — F17210 Nicotine dependence, cigarettes, uncomplicated: Secondary | ICD-10-CM

## 2024-02-05 DIAGNOSIS — J449 Chronic obstructive pulmonary disease, unspecified: Secondary | ICD-10-CM | POA: Diagnosis not present

## 2024-02-05 DIAGNOSIS — F17200 Nicotine dependence, unspecified, uncomplicated: Secondary | ICD-10-CM

## 2024-02-05 DIAGNOSIS — Z122 Encounter for screening for malignant neoplasm of respiratory organs: Secondary | ICD-10-CM

## 2024-02-05 DIAGNOSIS — R251 Tremor, unspecified: Secondary | ICD-10-CM | POA: Diagnosis not present

## 2024-02-05 DIAGNOSIS — R0609 Other forms of dyspnea: Secondary | ICD-10-CM

## 2024-02-05 MED ORDER — STIOLTO RESPIMAT 2.5-2.5 MCG/ACT IN AERS: 2.0000 | INHALATION_SPRAY | Freq: Every day | RESPIRATORY_TRACT | 6 refills | Status: DC

## 2024-02-05 MED ORDER — ALBUTEROL SULFATE (2.5 MG/3ML) 0.083% IN NEBU: 2.5000 mg | INHALATION_SOLUTION | Freq: Four times a day (QID) | RESPIRATORY_TRACT | 5 refills | Status: DC | PRN

## 2024-02-05 MED ORDER — ALBUTEROL SULFATE HFA 108 (90 BASE) MCG/ACT IN AERS: 2.0000 | INHALATION_SPRAY | Freq: Four times a day (QID) | RESPIRATORY_TRACT | 6 refills | Status: AC | PRN

## 2024-02-05 MED ORDER — NICOTINE 7 MG/24HR TD PT24: MEDICATED_PATCH | TRANSDERMAL | 0 refills | Status: DC

## 2024-02-05 NOTE — Patient Instructions (Addendum)
-   Start Stiolto 2 puffs every day - Can use albuterol  inhaler 2 puffs every 4 hours as needed - Can use albuterol  nebulizer every 4 hours as needed - Get PFT done - Someone will call you regarding lung cancer screening - Definitely see neurology to make sure you don't have parkinson's disease - I'll see you back in 3 months  - I think doing the cardiac pulmonary rehab is very important; try to get enrolled soon.

## 2024-02-07 ENCOUNTER — Encounter (HOSPITAL_COMMUNITY)

## 2024-02-10 DIAGNOSIS — J449 Chronic obstructive pulmonary disease, unspecified: Secondary | ICD-10-CM | POA: Diagnosis not present

## 2024-02-11 ENCOUNTER — Telehealth: Payer: Self-pay | Admitting: Internal Medicine

## 2024-02-11 NOTE — Telephone Encounter (Signed)
 Pt called stating she previously was advised to go to cardiac rehab by Dr. Mallipeddi but then they stopped her bp was too high. She recently saw Pulmonary who suggested she try cardiac rehab as well. She asked if she can cleared to start now.   9/30: 155/91  9/29: 146/116  9/23: 171/111  9/22: 168/108  9/21: 170/113

## 2024-02-12 ENCOUNTER — Encounter (HOSPITAL_COMMUNITY)

## 2024-02-12 MED ORDER — CARVEDILOL 3.125 MG PO TABS: 3.1250 mg | ORAL_TABLET | Freq: Two times a day (BID) | ORAL | 6 refills | Status: AC

## 2024-02-12 NOTE — Telephone Encounter (Signed)
 Patient notified and verbalized understanding.   New medication sent to [facility] now.

## 2024-02-12 NOTE — Telephone Encounter (Signed)
 Left message to return call

## 2024-02-13 ENCOUNTER — Telehealth: Payer: Self-pay | Admitting: Pulmonary Disease

## 2024-02-13 NOTE — Telephone Encounter (Signed)
 Patient is following up. She would like to know how much she needs to eat with the medication. She would like to know if she needs to have a full meal or if she can just have a light snack while taking carvedilol. She would also like to discuss having vitamin D checked. Please advise.

## 2024-02-13 NOTE — Telephone Encounter (Signed)
 Received reply from patient's DME company regarding request for new Nebulizer--Patient received NEB 06/13/2023, insurance will not cover another for 3/5 years. LVM for patient to return my call to discuss

## 2024-02-14 ENCOUNTER — Encounter (HOSPITAL_COMMUNITY)

## 2024-02-14 NOTE — Telephone Encounter (Signed)
 Spoke with patient and she states she has moved from [place]  and her nebulizer is still there---she took a gamble for a new one and will have to make arrangements to get he's from [place] .SABRA

## 2024-02-19 ENCOUNTER — Encounter (HOSPITAL_COMMUNITY)

## 2024-02-21 ENCOUNTER — Encounter (HOSPITAL_COMMUNITY)

## 2024-02-21 ENCOUNTER — Ambulatory Visit: Admitting: Neurology

## 2024-02-24 DIAGNOSIS — I10 Essential (primary) hypertension: Secondary | ICD-10-CM | POA: Diagnosis not present

## 2024-02-26 ENCOUNTER — Encounter (HOSPITAL_COMMUNITY)

## 2024-02-26 NOTE — Telephone Encounter (Signed)
 Returned call. Phone rings and no VM pick up. Multiple attempts to patient in referral notes, each time the patient does not answer and there is no VM pick up.   Copied from CRM [id number]. Topic: Appointments - Appointment Scheduling >> Feb 25, 2024  1:38 PM Corean SAUNDERS wrote: Please call patient back to schedule her lung cancer screening CT as she states she has been trying to get in touch with someone who can schedule this for 2 weeks. >> Feb 25, 2024  4:39 PM Zebedee B wrote: This is actually for the screening program I will forward over to the nurses

## 2024-02-27 ENCOUNTER — Encounter: Payer: Self-pay | Admitting: Emergency Medicine

## 2024-02-28 ENCOUNTER — Telehealth: Payer: Self-pay | Admitting: Pulmonary Disease

## 2024-02-28 ENCOUNTER — Encounter (HOSPITAL_COMMUNITY)

## 2024-02-28 NOTE — Telephone Encounter (Unsigned)
 Copied from CRM [id number]. Topic: Appointments - Scheduling Inquiry for Clinic >> Feb 28, 2024  2:30 PM Brittany Lester wrote: Reason for CRM: Patient states she has tried several times to schedule her LCS, she has even left voicemail's but is still unable to schedule. Patient requesting for office to schedule this for her.    Callback number: [phone]

## 2024-02-29 NOTE — Telephone Encounter (Signed)
 Attempted to return call to patient. Phone rings and rings but unable to leave a voicemail. Will await a call back.

## 2024-03-03 ENCOUNTER — Telehealth: Payer: Self-pay | Admitting: *Deleted

## 2024-03-03 ENCOUNTER — Other Ambulatory Visit: Payer: Self-pay | Admitting: *Deleted

## 2024-03-03 DIAGNOSIS — F17210 Nicotine dependence, cigarettes, uncomplicated: Secondary | ICD-10-CM

## 2024-03-03 DIAGNOSIS — Z122 Encounter for screening for malignant neoplasm of respiratory organs: Secondary | ICD-10-CM

## 2024-03-03 DIAGNOSIS — Z87891 Personal history of nicotine dependence: Secondary | ICD-10-CM

## 2024-03-03 NOTE — Telephone Encounter (Signed)
 Lung Cancer Screening Narrative/Criteria Questionnaire (Cigarette Smokers Only- No Cigars/Pipes/vapes)   Brittany Lester   SDMV:03/24/24 2:00- Kristen                                           1960-10-20              LDCT: 03/24/24 6:00 [facility]    63 y.o.   Phone: [phone]  Lung Screening Narrative (confirm age 60-77 yrs Medicare / 50-80 yrs Private pay insurance)   Insurance information:[employer]   Referring Provider:Alghanim   This screening involves an initial phone call with a team member from our program. It is called a shared decision making visit. The initial meeting is required by insurance and Medicare to make sure you understand the program. This appointment takes about 15-20 minutes to complete. The CT scan will completed at a separate date/time. This scan takes about 5-10 minutes to complete and you may eat and drink before and after the scan.  Criteria questions for Lung Cancer Screening:   Are you a current or former smoker? Current Age began smoking: 14   If you are a former smoker, what year did you quit smoking? {Quit x 13 yrs and started back )(within 15 yrs)   To calculate your smoking history, I need an accurate estimate of how many packs of cigarettes you smoked per day and for how many years. (Not just the number of PPD you are now smoking)   Years smoking 26 x Packs per day 1/4 - 1 = Pack years 20   (at least 20 pack yrs)   (Make sure they understand that we need to know how much they have smoked in the past, not just the number of PPD they are smoking now)  Do you have a personal history of cancer?  No    Do you have a family history of cancer? Yes  (cancer type and and relative) Aunt (lung) Sister (lung)  Are you coughing up blood?  Yes  Have you had unexplained weight loss of 15 lbs or more in the last 6 months? No  It looks like you meet all criteria.     Additional information: N/A

## 2024-03-04 ENCOUNTER — Encounter (HOSPITAL_COMMUNITY)

## 2024-03-06 ENCOUNTER — Encounter (HOSPITAL_COMMUNITY)

## 2024-03-11 ENCOUNTER — Encounter (HOSPITAL_COMMUNITY)

## 2024-03-11 ENCOUNTER — Ambulatory Visit (INDEPENDENT_AMBULATORY_CARE_PROVIDER_SITE_OTHER): Admitting: Neurology

## 2024-03-11 ENCOUNTER — Encounter: Payer: Self-pay | Admitting: Neurology

## 2024-03-11 ENCOUNTER — Ambulatory Visit: Admitting: Neurology

## 2024-03-11 VITALS — BP 164/123 | HR 82 | Resp 17 | Ht 62.0 in | Wt 81.5 lb

## 2024-03-11 DIAGNOSIS — I639 Cerebral infarction, unspecified: Secondary | ICD-10-CM | POA: Diagnosis not present

## 2024-03-11 DIAGNOSIS — M542 Cervicalgia: Secondary | ICD-10-CM | POA: Insufficient documentation

## 2024-03-11 DIAGNOSIS — R259 Unspecified abnormal involuntary movements: Secondary | ICD-10-CM | POA: Insufficient documentation

## 2024-03-11 MED ORDER — MELOXICAM 7.5 MG PO TABS: ORAL_TABLET | ORAL | 3 refills | Status: DC

## 2024-03-11 MED ORDER — MELOXICAM 7.5 MG PO TABS: ORAL_TABLET | ORAL | 3 refills | Status: AC

## 2024-03-11 NOTE — Progress Notes (Signed)
 Chief Complaint  Patient presents with   New Patient (Initial Visit)    Rm14, alone, referral for Hx of CVA with residual right UE deficit, increased tremors BLE / Trixie Jester APRN [phone].    ASSESSMENT AND PLAN  Brittany Lester is a 63 y.o. female   History of slow Worsening abnormal movement  Strong family history of similar disease, her paternal grandmother, father, siblings suffered similar abnormal movement and mental disorder, most worrisome for autosomal dominant genetic disorder, such as Huntington's disease, laboratory evaluation today  Chronic neck pain,  Most likely musculoskeletal etiology  Already on schedule for physical therapy at [facility], also suggested warm compression neck stretching  X-ray of cervical spine  Mobic as needed  History of left thalamic stroke  Multiple vascular risk factors, including smoking, hypertension, hyperlipidemia, coronary artery disease, status post stent, on dual antiplatelet Plavix  plus aspirin ,  DIAGNOSTIC DATA (LABS, IMAGING, TESTING) - I reviewed patient records, labs, notes, testing and imaging myself where available.   MEDICAL HISTORY:  Brittany Lester is a 63 year old female, seen in request by her primary care doctor Fanta, Tesfaye Demissie for evaluation of abnormal movement, neck pain, initial evaluation March 11, 2024  History is obtained from the patient and review of electronic medical records. I personally reviewed pertinent available imaging films in PACS.   PMHx of  Stroke Smoke COPD Hx of Hepatitis C Bipolar,  CAD, s/p stent  She has been on disability for many years due to her mood disorder, also slow Worsening abnormal movement over the past 10 years, strong family history of similar disease, paternal grandmother, father, 2 siblings all had mental illness, an abnormal movement, she has 2 children at age, her son 60, daughter 51, she did not get to see them all grandchildren much, there  was no abnormal movement noted in her children  She was noted to have constant large amplitude choreiform movement, unsteady gait, MRI of the brain in 2022 showed acute left thalamic stroke, presenting with sudden onset right paresthesia, which has recovered, there was no weakness, there was no significant caudate head atrophy noted on the MRI then  Patient has constant abnormal movement, slowly worsening neck pain, especially on the right side, denies significant radiating pain no weakness of upper or lower extremity  PHYSICAL EXAM:   Vitals:   03/11/24 0820 03/11/24 0825  BP: (!) 150/97 (!) 164/123  Pulse: 79 82  Resp: 17   SpO2: 97% 98%  Weight:  81 lb 8 oz (37 kg)  Height:  5' 2 (1.575 m)     Body mass index is 14.91 kg/m.  PHYSICAL EXAMNIATION:  Gen: NAD, conversant, well nourised, well groomed                     Cardiovascular: Regular rate rhythm, no peripheral edema, warm, nontender. Eyes: Conjunctivae clear without exudates or hemorrhage Neck: Supple, no carotid bruits. Pulmonary: Clear to auscultation bilaterally   NEUROLOGICAL EXAM:  MENTAL STATUS: Speech/cognition: Very thin middle-age female, awake, alert, oriented to history taking and casual conversation, constant large amplitude choreiform movement, CRANIAL NERVES: CN II: Visual fields are full to confrontation. Pupils are round equal and briskly reactive to light. CN III, IV, VI: extraocular movement are normal. No ptosis. CN V: Facial sensation is intact to light touch CN VII: Face is symmetric with normal eye closure  CN VIII: Hearing is normal to causal conversation. CN IX, X: Phonation is normal. CN XI: Head turning and shoulder  shrug are intact  MOTOR: There is no pronator drift of out-stretched arms. Muscle bulk and tone are normal. Muscle strength is normal.  REFLEXES: Reflexes are 1 and symmetric at the biceps, triceps, knees, and ankles. Plantar responses are flexor.  SENSORY: Intact to  light touch, pinprick and vibratory sensation are intact in fingers and toes.  COORDINATION: There is no trunk or limb dysmetria noted.  GAIT/STANCE: Push-up, wide base, unsteady gait  REVIEW OF SYSTEMS:  Full 14 system review of systems performed and notable only for as above All other review of systems were negative.   ALLERGIES: Allergies  Allergen Reactions   Amlodipine  Other (See Comments)    Dizziness   Erythromycin Other (See Comments)     GI upset    HOME MEDICATIONS: Current Outpatient Medications  Medication Sig Dispense Refill   acetaminophen  (TYLENOL ) 650 MG CR tablet Take 650-1,300 mg by mouth every 8 (eight) hours as needed for pain or fever.     albuterol  (PROVENTIL ) (2.5 MG/3ML) 0.083% nebulizer solution Take 3 mLs (2.5 mg total) by nebulization every 6 (six) hours as needed for wheezing or shortness of breath. 75 mL 5   albuterol  (VENTOLIN  HFA) 108 (90 Base) MCG/ACT inhaler Inhale 2 puffs into the lungs every 6 (six) hours as needed for wheezing or shortness of breath. 8.5 g 6   aspirin  EC 81 MG tablet Take 1 tablet (81 mg total) by mouth daily. Swallow whole. 30 tablet 11   atorvastatin  (LIPITOR) 40 MG tablet Take 1 tablet (40 mg total) by mouth daily. 30 tablet 1   bismuth subsalicylate (PEPTO BISMOL) 262 MG/15ML suspension Take 30 mLs by mouth every 6 (six) hours as needed for indigestion or diarrhea or loose stools.     Blood Pressure Monitoring (OMRON 7 SERIES BP MONITOR) DEVI 1 Units by Does not apply route as directed. 1 each 0   Cal Carb-Mag Hydrox-Simeth (ROLAIDS ADVANCED PO) Take 1 Dose by mouth as needed (Indigestion).     carvedilol (COREG) 3.125 MG tablet Take 1 tablet (3.125 mg total) by mouth 2 (two) times daily. 60 tablet 6   chlorthalidone  (HYGROTON ) 25 MG tablet Take 1 tablet (25 mg total) by mouth daily. 30 tablet 5   clopidogrel  (PLAVIX ) 75 MG tablet Take 1 tablet (75 mg total) by mouth daily. 90 tablet 3   DULoxetine  (CYMBALTA ) 60 MG capsule  Take 60 mg by mouth at bedtime.     gabapentin (NEURONTIN) 600 MG tablet Take 600 mg by mouth 3 (three) times daily.     hydrOXYzine  (ATARAX ) 25 MG tablet Take 25 mg by mouth 3 (three) times daily as needed for anxiety.     nitroGLYCERIN  (NITROSTAT ) 0.4 MG SL tablet Place 1 tablet (0.4 mg total) under the tongue every 5 (five) minutes x 3 doses as needed (if no relief after 3rd dose proceed to ED or call 911). 25 tablet 2   potassium chloride  SA (KLOR-CON  M) 20 MEQ tablet Take 1 tablet (20 mEq total) by mouth daily. 30 tablet 5   Tiotropium Bromide-Olodaterol (STIOLTO RESPIMAT) 2.5-2.5 MCG/ACT AERS Inhale 2 puffs into the lungs daily. 4 g 6   No current facility-administered medications for this visit.    PAST MEDICAL HISTORY: Past Medical History:  Diagnosis Date   Atrophic vaginitis 07/24/2014   Bipolar disorder (HCC)    BV (bacterial vaginosis) 07/24/2014   COPD (chronic obstructive pulmonary disease) (HCC)    Hepatitis C    Harvoni   Hepatitis C  Herpes    Hyperlipidemia    Hypertension    LGSIL of cervix of undetermined significance 06/24/2020   2/22 will get Colpo   Lupus    ???   Medical history non-contributory    Stroke [facility])    right sided weakness   Vaginal discharge 07/24/2014    PAST SURGICAL HISTORY: Past Surgical History:  Procedure Laterality Date   BIOPSY  11/17/2022   Procedure: BIOPSY;  Surgeon: Cindie Carlin POUR, DO;  Location: [facility];  Service: Endoscopy;;   c section  12/18/1993   CESAREAN SECTION     COLONOSCOPY N/A 12/24/2013   DOQ:Wnmfjo mucosa in the terminal ileum/TWO colon polyps removed/ The LEFT colon IS redundant/Small internal hemorrhoids. tubular adenoma. next TCS 12/2018   COLONOSCOPY WITH PROPOFOL  N/A 11/17/2022   Procedure: COLONOSCOPY WITH PROPOFOL ;  Surgeon: Cindie Carlin POUR, DO;  Location: [facility];  Service: Endoscopy;  Laterality: N/A;  10:45 am, asa 3   CORONARY PRESSURE/FFR WITH 3D MAPPING N/A 12/19/2023   Procedure:  Coronary Pressure/FFR w/3D Mapping;  Surgeon: Ladona Heinz, MD;  Location: [facility];  Service: Cardiovascular;  Laterality: N/A;   CORONARY STENT INTERVENTION N/A 12/19/2023   Procedure: CORONARY STENT INTERVENTION;  Surgeon: Ladona Heinz, MD;  Location: [facility];  Service: Cardiovascular;  Laterality: N/A;   ESOPHAGOGASTRODUODENOSCOPY N/A 02/06/2014   SLF: 1. No obvious reason for weight loss identified 2. Dyspepsia due to MILD erosive gastritis.   ESOPHAGOGASTRODUODENOSCOPY (EGD) WITH PROPOFOL  N/A 11/17/2022   Procedure: ESOPHAGOGASTRODUODENOSCOPY (EGD) WITH PROPOFOL ;  Surgeon: Cindie Carlin POUR, DO;  Location: [facility];  Service: Endoscopy;  Laterality: N/A;   FRACTURE SURGERY Right    20 years ago   LEFT HEART CATH AND CORONARY ANGIOGRAPHY N/A 12/19/2023   Procedure: LEFT HEART CATH AND CORONARY ANGIOGRAPHY;  Surgeon: Ladona Heinz, MD;  Location: [facility];  Service: Cardiovascular;  Laterality: N/A;   POLYPECTOMY  11/17/2022   Procedure: POLYPECTOMY;  Surgeon: Cindie Carlin POUR, DO;  Location: [facility];  Service: Endoscopy;;    FAMILY HISTORY: Family History  Problem Relation Age of Onset   Colon cancer Maternal Aunt        age 71   Heart attack Mother    Thyroid  disease Mother    Heart attack Father    Alcohol  abuse Father    Depression Father    Heart disease Sister    Thyroid  disease Sister    Bipolar disorder Sister    Cancer Sister        lung   Drug abuse Sister    Bipolar disorder Brother    Drug abuse Brother    Drug abuse Son    Liver disease Neg Hx     SOCIAL HISTORY: Social History   Socioeconomic History   Marital status: Significant Other    Spouse name: Not on file   Number of children: 2   Years of education: Not on file   Highest education level: Not on file  Occupational History   Not on file  Tobacco Use   Smoking status: Every Day    Current packs/day: 0.25    Average packs/day: 0.3 packs/day for 40.0 years (10.0  ttl pk-yrs)    Types: Cigarettes   Smokeless tobacco: Never  Vaping Use   Vaping status: Never Used  Substance and Sexual Activity   Alcohol  use: Not Currently    Comment: occasional. Previously drank beer daily. Now has mixed drinks with vodka  every few months.   Drug use: No    Comment: H/O IVDU, quit 2006   Sexual activity: Yes    Birth control/protection: Post-menopausal  Other Topics Concern   Not on file  Social History Narrative   Not on file   Social Drivers of Health   Financial Resource Strain: High Risk (06/20/2023)   Overall Financial Resource Strain (CARDIA)    Difficulty of Paying Living Expenses: Hard  Food Insecurity: Food Insecurity Present (06/20/2023)   Hunger Vital Sign    Worried About Running Out of Food in the Last Year: Sometimes true    Ran Out of Food in the Last Year: Sometimes true  Transportation Needs: No Transportation Needs (06/20/2023)   PRAPARE - Administrator, Civil Service (Medical): No    Lack of Transportation (Non-Medical): No  Physical Activity: Insufficiently Active (06/20/2023)   Exercise Vital Sign    Days of Exercise per Week: 2 days    Minutes of Exercise per Session: 10 min  Stress: Stress Concern Present (06/20/2023)   [employer] of Occupational Health - Occupational Stress Questionnaire    Feeling of Stress : Rather much  Social Connections: Moderately Integrated (06/20/2023)   Social Connection and Isolation Panel    Frequency of Communication with Friends and Family: More than three times a week    Frequency of Social Gatherings with Friends and Family: Never    Attends Religious Services: 1 to 4 times per year    Active Member of [employer] or Organizations: No    Attends Banker Meetings: Never    Marital Status: Living with partner  Intimate Partner Violence: Not At Risk (06/20/2023)   Humiliation, Afraid, Rape, and Kick questionnaire    Fear of Current or Ex-Partner: No    Emotionally Abused: No     Physically Abused: No    Sexually Abused: No      Modena Callander, M.D. Ph.D.  [facility] [address], [address] [place], [place] [address] Ph: [phone] Fax: [phone]  CC:  Leafy Trixie DEL, NP [address] [place],  [place] [address]  Carlette Benita Trixie, MD

## 2024-03-12 ENCOUNTER — Encounter (HOSPITAL_COMMUNITY): Payer: Self-pay

## 2024-03-12 NOTE — Progress Notes (Signed)
 Cardiac Individual Treatment Plan  Patient Details  Name: Brittany Lester MRN: [id number] Date of Birth: 1961/04/07 Referring Provider:    Initial Encounter Date:   Visit Diagnosis: No diagnosis found.  Patient's Home Medications on Admission:  Current Outpatient Medications:    acetaminophen  (TYLENOL ) 650 MG CR tablet, Take 650-1,300 mg by mouth every 8 (eight) hours as needed for pain or fever., Disp: , Rfl:    albuterol  (PROVENTIL ) (2.5 MG/3ML) 0.083% nebulizer solution, Take 3 mLs (2.5 mg total) by nebulization every 6 (six) hours as needed for wheezing or shortness of breath., Disp: 75 mL, Rfl: 5   albuterol  (VENTOLIN  HFA) 108 (90 Base) MCG/ACT inhaler, Inhale 2 puffs into the lungs every 6 (six) hours as needed for wheezing or shortness of breath., Disp: 8.5 g, Rfl: 6   aspirin  EC 81 MG tablet, Take 1 tablet (81 mg total) by mouth daily. Swallow whole., Disp: 30 tablet, Rfl: 11   atorvastatin  (LIPITOR) 40 MG tablet, Take 1 tablet (40 mg total) by mouth daily., Disp: 30 tablet, Rfl: 1   bismuth subsalicylate (PEPTO BISMOL) 262 MG/15ML suspension, Take 30 mLs by mouth every 6 (six) hours as needed for indigestion or diarrhea or loose stools., Disp: , Rfl:    Blood Pressure Monitoring (OMRON 7 SERIES BP MONITOR) DEVI, 1 Units by Does not apply route as directed., Disp: 1 each, Rfl: 0   Cal Carb-Mag Hydrox-Simeth (ROLAIDS ADVANCED PO), Take 1 Dose by mouth as needed (Indigestion)., Disp: , Rfl:    carvedilol (COREG) 3.125 MG tablet, Take 1 tablet (3.125 mg total) by mouth 2 (two) times daily., Disp: 60 tablet, Rfl: 6   chlorthalidone  (HYGROTON ) 25 MG tablet, Take 1 tablet (25 mg total) by mouth daily., Disp: 30 tablet, Rfl: 5   clopidogrel  (PLAVIX ) 75 MG tablet, Take 1 tablet (75 mg total) by mouth daily., Disp: 90 tablet, Rfl: 3   DULoxetine  (CYMBALTA ) 60 MG capsule, Take 60 mg by mouth at bedtime., Disp: , Rfl:    gabapentin (NEURONTIN) 600 MG tablet, Take 600 mg by mouth 3 (three) times  daily., Disp: , Rfl:    hydrOXYzine  (ATARAX ) 25 MG tablet, Take 25 mg by mouth 3 (three) times daily as needed for anxiety., Disp: , Rfl:    meloxicam (MOBIC) 7.5 MG tablet, Daily as needed after meal, Disp: 30 tablet, Rfl: 3   nitroGLYCERIN  (NITROSTAT ) 0.4 MG SL tablet, Place 1 tablet (0.4 mg total) under the tongue every 5 (five) minutes x 3 doses as needed (if no relief after 3rd dose proceed to ED or call 911)., Disp: 25 tablet, Rfl: 2   potassium chloride  SA (KLOR-CON  M) 20 MEQ tablet, Take 1 tablet (20 mEq total) by mouth daily., Disp: 30 tablet, Rfl: 5   Tiotropium Bromide-Olodaterol (STIOLTO RESPIMAT) 2.5-2.5 MCG/ACT AERS, Inhale 2 puffs into the lungs daily., Disp: 4 g, Rfl: 6  Past Medical History: Past Medical History:  Diagnosis Date   Atrophic vaginitis 07/24/2014   Bipolar disorder (HCC)    BV (bacterial vaginosis) 07/24/2014   COPD (chronic obstructive pulmonary disease) (HCC)    Hepatitis C    Harvoni   Hepatitis C    Herpes    Hyperlipidemia    Hypertension    LGSIL of cervix of undetermined significance 06/24/2020   2/22 will get Colpo   Lupus    ???   Medical history non-contributory    Stroke [facility])    right sided weakness   Vaginal discharge 07/24/2014    Tobacco Use:  Social History   Tobacco Use  Smoking Status Every Day   Current packs/day: 0.25   Average packs/day: 0.3 packs/day for 40.0 years (10.0 ttl pk-yrs)   Types: Cigarettes  Smokeless Tobacco Never    Labs: Review Flowsheet       Latest Ref Rng & Units 12/10/2007 11/25/2020 11/26/2020  Labs for ITP Cardiac and Pulmonary Rehab  Cholestrol 0 - 200 mg/dL 826  - 725   LDL (calc) 0 - 99 mg/dL 89  - 825   HDL-C >59 mg/dL 60  - 79   Trlycerides <150 mg/dL 877  - 892   Hemoglobin A1c 4.8 - 5.6 % - - 5.4   TCO2 22 - 32 mmol/L - 26  -     Exercise Target Goals: Exercise Program Goal: Individual exercise prescription set using results from initial 6 min walk test and THRR while considering   patient's activity barriers and safety.   Exercise Prescription Goal: Initial exercise prescription builds to 30-45 minutes a day of aerobic activity, 2-3 days per week.  Home exercise guidelines will be given to patient during program as part of exercise prescription that the participant will acknowledge.   Education: Aerobic Exercise: - Group verbal and visual presentation on the components of exercise prescription. Introduces F.I.T.T principle from ACSM for exercise prescriptions.  Reviews F.I.T.T. principles of aerobic exercise including progression. Written material provided at class time.   Education: Resistance Exercise: - Group verbal and visual presentation on the components of exercise prescription. Introduces F.I.T.T principle from ACSM for exercise prescriptions  Reviews F.I.T.T. principles of resistance exercise including progression. Written material provided at class time.    Education: Exercise & Equipment Safety: - Individual verbal instruction and demonstration of equipment use and safety with use of the equipment.   Education: Exercise Physiology & General Exercise Guidelines: - Group verbal and written instruction with models to review the exercise physiology of the cardiovascular system and associated critical values. Provides general exercise guidelines with specific guidelines to those with heart or lung disease. Written material provided at class time.   Education: Flexibility, Balance, Mind/Body Relaxation: - Group verbal and visual presentation with interactive activity on the components of exercise prescription. Introduces F.I.T.T principle from ACSM for exercise prescriptions. Reviews F.I.T.T. principles of flexibility and balance exercise training including progression. Also discusses the mind body connection.  Reviews various relaxation techniques to help reduce and manage stress (i.e. Deep breathing, progressive muscle relaxation, and visualization). Balance  handout provided to take home. Written material provided at class time.   Activity Barriers & Risk Stratification:   6 Minute Walk:   Oxygen  Initial Assessment:   Oxygen  Re-Evaluation:   Oxygen  Discharge (Final Oxygen  Re-Evaluation):   Initial Exercise Prescription:   Perform Capillary Blood Glucose checks as needed.  Exercise Prescription Changes:   Exercise Comments:   Exercise Goals and Review:   Exercise Goals Re-Evaluation :   Discharge Exercise Prescription (Final Exercise Prescription Changes):   Nutrition:  Target Goals: Understanding of nutrition guidelines, daily intake of sodium 1500mg , cholesterol 200mg , calories 30% from fat and 7% or less from saturated fats, daily to have 5 or more servings of fruits and vegetables.  Education: Nutrition 1 -Group instruction provided by verbal, written material, interactive activities, discussions, models, and posters to present general guidelines for heart healthy nutrition including macronutrients, label reading, and promoting whole foods over processed counterparts. Education serves as pensions consultant of discussion of heart healthy eating for all. Written material provided at class time.  Education: Nutrition 2 -Group instruction provided by verbal, written material, interactive activities, discussions, models, and posters to present general guidelines for heart healthy nutrition including sodium, cholesterol, and saturated fat. Providing guidance of habit forming to improve blood pressure, cholesterol, and body weight. Written material provided at class time.     Biometrics:    Nutrition Therapy Plan and Nutrition Goals:   Nutrition Assessments:  MEDIFICTS Score Key: >=70 Need to make dietary changes  40-70 Heart Healthy Diet <= 40 Therapeutic Level Cholesterol Diet  Flowsheet Row CARDIAC VIRTUAL BASED CARE from 01/09/2024 in [facility] CARDIAC REH[facility]ILITATION  Picture Your Plate Total Score on  Admission 39   Picture Your Plate Scores: <59 Unhealthy dietary pattern with much room for improvement. 41-50 Dietary pattern unlikely to meet recommendations for good health and room for improvement. 51-60 More healthful dietary pattern, with some room for improvement.  >60 Healthy dietary pattern, although there may be some specific behaviors that could be improved.    Nutrition Goals Re-Evaluation:   Nutrition Goals Discharge (Final Nutrition Goals Re-Evaluation):   Psychosocial: Target Goals: Acknowledge presence or absence of significant depression and/or stress, maximize coping skills, provide positive support system. Participant is able to verbalize types and ability to use techniques and skills needed for reducing stress and depression.   Education: Stress, Anxiety, and Depression - Group verbal and visual presentation to define topics covered.  Reviews how body is impacted by stress, anxiety, and depression.  Also discusses healthy ways to reduce stress and to treat/manage anxiety and depression. Written material provided at class time.   Education: Sleep Hygiene -Provides group verbal and written instruction about how sleep can affect your health.  Define sleep hygiene, discuss sleep cycles and impact of sleep habits. Review good sleep hygiene tips.   Initial Review & Psychosocial Screening:   Quality of Life Scores:   Scores of 19 and below usually indicate a poorer quality of life in these areas.  A difference of  2-3 points is a clinically meaningful difference.  A difference of 2-3 points in the total score of the Quality of Life Index has been associated with significant improvement in overall quality of life, self-image, physical symptoms, and general health in studies assessing change in quality of life.  PHQ-9: Review Flowsheet  More data may exist      01/14/2024 06/20/2023 06/14/2020 05/16/2016 04/26/2016  Depression screen PHQ 2/9  Decreased Interest 3 0 3 0 0   Down, Depressed, Hopeless 3 0 3 0 0  PHQ - 2 Score 6 0 6 0 0  Altered sleeping 2 2 2  - -  Tired, decreased energy 3 2 3  - -  Change in appetite 0 0 0 - -  Feeling bad or failure about yourself  0 0 1 - -  Trouble concentrating 2 2 1  - -  Moving slowly or fidgety/restless 1 0 0 - -  Suicidal thoughts 0 0 0 - -  PHQ-9 Score 14 6 13  - -  Difficult doing work/chores Somewhat difficult - - - -   Interpretation of Total Score  Total Score Depression Severity:  1-4 = Minimal depression, 5-9 = Mild depression, 10-14 = Moderate depression, 15-19 = Moderately severe depression, 20-27 = Severe depression   Psychosocial Evaluation and Intervention:   Psychosocial Re-Evaluation:   Psychosocial Discharge (Final Psychosocial Re-Evaluation):   Vocational Rehabilitation: Provide vocational rehab assistance to qualifying candidates.   Vocational Rehab Evaluation & Intervention:   Education: Education Goals: Education classes will be  provided on a variety of topics geared toward better understanding of heart health and risk factor modification. Participant will state understanding/return demonstration of topics presented as noted by education test scores.  Learning Barriers/Preferences:   General Cardiac Education Topics:  AED/CPR: - Group verbal and written instruction with the use of models to demonstrate the basic use of the AED with the basic ABC's of resuscitation.   Test and Procedures: - Group verbal and visual presentation and models provide information about basic cardiac anatomy and function. Reviews the testing methods done to diagnose heart disease and the outcomes of the test results. Describes the treatment choices: Medical Management, Angioplasty, or Coronary Bypass Surgery for treating various heart conditions including Myocardial Infarction, Angina, Valve Disease, and Cardiac Arrhythmias. Written material provided at class time.   Medication Safety: - Group verbal and  visual instruction to review commonly prescribed medications for heart and lung disease. Reviews the medication, class of the drug, and side effects. Includes the steps to properly store meds and maintain the prescription regimen. Written material provided at class time.   Intimacy: - Group verbal instruction through game format to discuss how heart and lung disease can affect sexual intimacy. Written material provided at class time.   Know Your Numbers and Heart Failure: - Group verbal and visual instruction to discuss disease risk factors for cardiac and pulmonary disease and treatment options.  Reviews associated critical values for Overweight/Obesity, Hypertension, Cholesterol, and Diabetes.  Discusses basics of heart failure: signs/symptoms and treatments.  Introduces Heart Failure Zone chart for action plan for heart failure. Written material provided at class time.   Infection Prevention: - Provides verbal and written material to individual with discussion of infection control including proper hand washing and proper equipment cleaning during exercise session.   Falls Prevention: - Provides verbal and written material to individual with discussion of falls prevention and safety.   Other: -Provides group and verbal instruction on various topics (see comments)   Knowledge Questionnaire Score:   Core Components/Risk Factors/Patient Goals at Admission:   Education:Diabetes - Individual verbal and written instruction to review signs/symptoms of diabetes, desired ranges of glucose level fasting, after meals and with exercise. Acknowledge that pre and post exercise glucose checks will be done for 3 sessions at entry of program.   Core Components/Risk Factors/Patient Goals Review:    Core Components/Risk Factors/Patient Goals at Discharge (Final Review):    ITP Comments:  ITP Comments     Row Name 01/16/24 1524 03/12/24 0914         ITP Comments 30 day review completed.  ITP sent to Dr. Dorn Ross, Medical Director of Cardiac Rehab. Continue with ITP unless changes are made by physician.  Has only attended orientation this week. 30 day review completed. ITP sent to Dr. Dorn Ross, Medical Director of Cardiac Rehab. Continue with ITP unless changes are made by physician.  Has only attended orientation so far. Been out due to elevated BP still.         Comments: 30 day review

## 2024-03-13 ENCOUNTER — Encounter (HOSPITAL_COMMUNITY)

## 2024-03-17 ENCOUNTER — Other Ambulatory Visit (HOSPITAL_COMMUNITY): Payer: Self-pay

## 2024-03-18 ENCOUNTER — Encounter (HOSPITAL_COMMUNITY)

## 2024-03-20 ENCOUNTER — Encounter (HOSPITAL_COMMUNITY)

## 2024-03-21 ENCOUNTER — Ambulatory Visit (HOSPITAL_COMMUNITY)
Admission: RE | Admit: 2024-03-21 | Discharge: 2024-03-21 | Disposition: A | Discharge status: Home / Self Care | Source: Ambulatory Visit | Attending: Neurology | Admitting: Neurology

## 2024-03-21 DIAGNOSIS — I639 Cerebral infarction, unspecified: Secondary | ICD-10-CM | POA: Diagnosis present

## 2024-03-21 DIAGNOSIS — R259 Unspecified abnormal involuntary movements: Secondary | ICD-10-CM | POA: Diagnosis present

## 2024-03-24 ENCOUNTER — Ambulatory Visit: Payer: Self-pay | Admitting: Neurology

## 2024-03-24 ENCOUNTER — Encounter: Payer: Self-pay | Admitting: *Deleted

## 2024-03-24 ENCOUNTER — Ambulatory Visit: Admitting: *Deleted

## 2024-03-24 ENCOUNTER — Ambulatory Visit (HOSPITAL_COMMUNITY)
Admission: RE | Admit: 2024-03-24 | Discharge: 2024-03-24 | Disposition: A | Discharge status: Home / Self Care | Source: Ambulatory Visit | Attending: Acute Care | Admitting: Acute Care

## 2024-03-24 DIAGNOSIS — F17210 Nicotine dependence, cigarettes, uncomplicated: Secondary | ICD-10-CM

## 2024-03-24 DIAGNOSIS — Z87891 Personal history of nicotine dependence: Secondary | ICD-10-CM | POA: Diagnosis present

## 2024-03-24 DIAGNOSIS — Z122 Encounter for screening for malignant neoplasm of respiratory organs: Secondary | ICD-10-CM | POA: Diagnosis present

## 2024-03-24 NOTE — Patient Instructions (Signed)

## 2024-03-24 NOTE — Progress Notes (Signed)
 Virtual Visit via Telephone Note  I connected with Tasheba Treinen on 03/24/24 at  2:00 PM EST by telephone and verified that I am speaking with the correct person using two identifiers.  Location: Patient: in home Provider: [address], [place], [place], [address]    Shared Decision Making Visit Lung Cancer Screening Program [phone])   Eligibility: Age 63 y.o. Pack Years Smoking History Calculation 20 (# packs/per year x # years smoked) Recent History of coughing up blood  no Unexplained weight loss? no ( >Than 15 pounds within the last 6 months ) Prior History Lung / other cancer no (Diagnosis within the last 5 years already requiring surveillance chest CT Scans). Smoking Status Current Smoker Former Smokers: Years since quit:  NA  Quit Date: NA  Visit Components: Discussion included one or more decision making aids. yes Discussion included risk/benefits of screening. yes Discussion included potential follow up diagnostic testing for abnormal scans. yes Discussion included meaning and risk of over diagnosis. yes Discussion included meaning and risk of False Positives. yes Discussion included meaning of total radiation exposure. yes  Counseling Included: Importance of adherence to annual lung cancer LDCT screening. yes Impact of comorbidities on ability to participate in the program. yes Ability and willingness to under diagnostic treatment. yes  Smoking Cessation Counseling: Current Smokers:  Discussed importance of smoking cessation. yes Information about tobacco cessation classes and interventions provided to patient. yes Patient provided with ticket for LDCT Scan. yes Symptomatic Patient. no  Counseling NA Diagnosis Code: Tobacco Use Z72.0 Asymptomatic Patient yes  Counseling (Intermediate counseling: > three minutes counseling) [id number]  Counseled patient 4 minutes regarding tobacco use.   Former Smokers:  Discussed the importance of maintaining  cigarette abstinence. yes Diagnosis Code: Personal History of Nicotine  Dependence. S12.108 Information about tobacco cessation classes and interventions provided to patient. Yes Patient provided with ticket for LDCT Scan. yes Written Order for Lung Cancer Screening with LDCT placed in Epic. Yes (CT Chest Lung Cancer Screening Low Dose W/O CM) [id number] Z12.2-Screening of respiratory organs Z87.891-Personal history of nicotine  dependence   Josette Ranger, RN 03/24/24

## 2024-03-25 ENCOUNTER — Encounter (HOSPITAL_COMMUNITY)

## 2024-03-27 ENCOUNTER — Encounter (HOSPITAL_COMMUNITY)

## 2024-03-27 LAB — COMPREHENSIVE METABOLIC PANEL WITH GFR
ALT: 21 IU/L (ref 0–32)
AST: 30 IU/L (ref 0–40)
Albumin: 4.8 g/dL (ref 3.9–4.9)
Alkaline Phosphatase: 121 IU/L (ref 49–135)
BUN/Creatinine Ratio: 30 — ABNORMAL HIGH (ref 12–28)
BUN: 27 mg/dL (ref 8–27)
Bilirubin Total: 0.3 mg/dL (ref 0.0–1.2)
CO2: 26 mmol/L (ref 20–29)
Calcium: 10 mg/dL (ref 8.7–10.3)
Chloride: 97 mmol/L (ref 96–106)
Creatinine, Ser: 0.89 mg/dL (ref 0.57–1.00)
Globulin, Total: 2.9 g/dL (ref 1.5–4.5)
Glucose: 98 mg/dL (ref 70–99)
Potassium: 3.9 mmol/L (ref 3.5–5.2)
Sodium: 139 mmol/L (ref 134–144)
Total Protein: 7.7 g/dL (ref 6.0–8.5)
eGFR: 73 mL/min/1.73 (ref >59)

## 2024-03-27 LAB — TSH: TSH: 0.733 u[IU]/mL (ref 0.450–4.500)

## 2024-03-27 LAB — CERULOPLASMIN: Ceruloplasmin: 38.9 mg/dL (ref 19.0–39.0)

## 2024-03-27 LAB — COPPER, SERUM: Copper: 175 ug/dL — ABNORMAL HIGH (ref 80–158)

## 2024-03-27 LAB — HUNTINGTON DISEASE REPEAT EXP

## 2024-03-27 NOTE — Telephone Encounter (Signed)
 Please found her Huntington's disease lab result, that was ordered March 11, 2024 through [facility]

## 2024-03-28 NOTE — Telephone Encounter (Signed)
 SABRA

## 2024-03-31 ENCOUNTER — Other Ambulatory Visit: Payer: Self-pay | Admitting: Acute Care

## 2024-03-31 DIAGNOSIS — Z122 Encounter for screening for malignant neoplasm of respiratory organs: Secondary | ICD-10-CM

## 2024-03-31 DIAGNOSIS — Z87891 Personal history of nicotine dependence: Secondary | ICD-10-CM

## 2024-03-31 DIAGNOSIS — F17210 Nicotine dependence, cigarettes, uncomplicated: Secondary | ICD-10-CM

## 2024-04-01 ENCOUNTER — Encounter (HOSPITAL_COMMUNITY)

## 2024-04-08 ENCOUNTER — Encounter (HOSPITAL_COMMUNITY)

## 2024-04-08 ENCOUNTER — Inpatient Hospital Stay (HOSPITAL_COMMUNITY): Admission: RE | Admit: 2024-04-08 | Source: Ambulatory Visit

## 2024-04-09 ENCOUNTER — Telehealth: Payer: Self-pay | Admitting: Neurology

## 2024-04-09 NOTE — Telephone Encounter (Signed)
 Dr Onita, not sure if you saw recent lab results. Can you review and comment before we call the patient back?

## 2024-04-09 NOTE — Telephone Encounter (Signed)
 Pt is asking for a call to discuss results to recent lab work

## 2024-04-10 ENCOUNTER — Encounter (HOSPITAL_COMMUNITY)

## 2024-04-10 ENCOUNTER — Telehealth: Payer: Self-pay | Admitting: Pulmonary Disease

## 2024-04-10 NOTE — Telephone Encounter (Signed)
 Please call patient for laboratory evaluation from November 2025:  -- Mild elevation of copper  level 175, with normal ceruloplasmin, may consider repeat test at her next follow-up with his primary care physician  --- Rest of the laboratory evaluation including genetic testing showed no significant abnormality

## 2024-04-10 NOTE — Telephone Encounter (Signed)
 Patient informed with all the below, will follow up with PCP regarding copper  level recheck.

## 2024-04-10 NOTE — Telephone Encounter (Signed)
 Copied from CRM [id number]. Topic: Appointments - Scheduling Inquiry for Clinic >> Apr 10, 2024  2:58 PM Brittany Lester wrote: Reason for CRM: Patient is calling to inquire if she should keep current appt for December 22nd as her PFT is December 12th and inquired if Dr. DELENA will have PFT results.  Called and informed patient Brittany Lester will have the results of the PFT scheduled on 04/18/24 for her 04/28/24 visit.  She voiced her understanding

## 2024-04-15 ENCOUNTER — Encounter (HOSPITAL_COMMUNITY)

## 2024-04-17 ENCOUNTER — Encounter (HOSPITAL_COMMUNITY)

## 2024-04-18 ENCOUNTER — Inpatient Hospital Stay (HOSPITAL_COMMUNITY): Admission: RE | Admit: 2024-04-18 | Discharge: 2024-04-18 | Attending: Pulmonary Disease | Admitting: Pulmonary Disease

## 2024-04-18 DIAGNOSIS — R0609 Other forms of dyspnea: Secondary | ICD-10-CM | POA: Diagnosis present

## 2024-04-18 LAB — BLOOD GAS, ARTERIAL
Acid-Base Excess: 2.3 mmol/L — ABNORMAL HIGH (ref 0.0–2.0)
Bicarbonate: 26.5 mmol/L (ref 20.0–28.0)
Drawn by: 560031
O2 Saturation: 98.4 %
Patient temperature: 37
pCO2 arterial: 39 mmHg (ref 32–48)
pH, Arterial: 7.44 (ref 7.35–7.45)
pO2, Arterial: 76 mmHg — ABNORMAL LOW (ref 83–108)

## 2024-04-18 LAB — PULMONARY FUNCTION TEST
DL/VA % pred: 74 %
DL/VA: 3.16 ml/min/mmHg/L
DLCO unc % pred: 62 %
DLCO unc: 11.61 ml/min/mmHg
FEF 25-75 Pre: 1.05 L/s
FEF2575-%Pred-Pre: 49 %
FEV1-%Pred-Pre: 81 %
FEV1-Pre: 1.87 L
FEV1FVC-%Pred-Pre: 87 %
FEV6-%Pred-Pre: 95 %
FEV6-Pre: 2.74 L
FEV6FVC-%Pred-Pre: 103 %
FVC-%Pred-Pre: 92 %
FVC-Pre: 2.75 L
Pre FEV1/FVC ratio: 68 %
Pre FEV6/FVC Ratio: 100 %
RV % pred: 133 %
RV: 2.6 L
TLC % pred: 115 %
TLC: 5.48 L

## 2024-04-18 NOTE — Progress Notes (Signed)
 RT NOTE:  ABG obtained with PFT order, results as followed:   Latest Reference Range & Units 04/18/24 11:29  pH, Arterial 7.35 - 7.45  7.44  pCO2 arterial 32 - 48 mmHg 39  pO2, Arterial 83 - 108 mmHg 76 (L)  Acid-Base Excess 0.0 - 2.0 mmol/L 2.3 (H)  Bicarbonate 20.0 - 28.0 mmol/L 26.5  O2 Saturation % 98.4  Patient temperature  37.0  Collection site  RIGHT RADIAL  Allens test (pass/fail) PASS  PASS  (L): Data is abnormally low (H): Data is abnormally high  No critical values to report.

## 2024-04-20 ENCOUNTER — Ambulatory Visit: Payer: Self-pay | Admitting: Pulmonary Disease

## 2024-04-22 ENCOUNTER — Encounter (HOSPITAL_COMMUNITY)

## 2024-04-24 ENCOUNTER — Encounter (HOSPITAL_COMMUNITY)

## 2024-04-27 NOTE — Progress Notes (Unsigned)
 "  Established Patient Pulmonology Office Visit   Subjective:  Patient ID: Brittany Lester, female    DOB: Dec 14, 1960  MRN: [id number]  CC: No chief complaint on file.   HPI  Joesphine Lester is a 63 y.o. female with a PMH significant for HTN, HLD, nicotine  dependence, and CAD s/p PCI to LAD (12/19/23) who presents for follow up.  Last seen on 02/05/2024, dyspnea thought to be due to neuromuscular disorder +/- COPD. Ordered PFTs and enrolled patient in lung cancer screening program. Saw neurology and doing initial work up for movement disorder.    {PULM QUESTIONNAIRES (Optional):33196}  ROS  {History (Optional):23778} Current Medications[1]      Objective:  There were no vitals taken for this visit. {Pulm Vitals (Optional):32837}  Physical Exam   Diagnostic Review:  {Labs (Optional):32838}  CTA Coronaries 09/2023: I reviewed lung windows independently and visualized portions of the lung are without infiltrates, nodularities, or any masses   TTE 09/2023: IMPRESSIONS 1. Left ventricular ejection fraction, by estimation, is 65 to 70%. Theleft ventricle has normal function. The left ventricle has no regionalwall motion abnormalities. Left ventricular diastolic parameters werenormal. 2. Right ventricular systolic function is normal. The right ventricularsize is normal. There is normal pulmonary artery systolic pressure. Theestimated right ventricular systolic pressure is 17.3 mmHg. 3. The mitral valve is degenerative. Mild mitral valve regurgitation. Noevidence of mitral stenosis. The mean mitral valve gradient is 2.0 mmHg. 4. The aortic valve is tricuspid. Aortic valve regurgitation is notvisualized. 5. The inferior vena cava is normal in size with greater than 50%respiratory variability, suggesting right atrial pressure of 3 mmHg.  SABRA LHC 12/2023: Hemodynamic data: EDP 18 mmHg.  No pressure gradient across aortic valve.   Angiographic data: Left dominant system. LM: Moderate caliber  vessel.  Appears normal. LCx: Dominant.  Mid segment has mild 20 to 30% stenosis followed by mid to distal 20 to 30% stenosis.  Otherwise smooth and normal, mild proximal coronary calcification is evident. LAD: Has acute band at its origin giving a 50 to 80% stenosis like appearance.  Virtual FFR 0.89, RFR 0.88, not hemodynamically significant.  Midsegment the LAD had a 60% stenosis and mildly calcified after origin of D2.  RFR was positive at 0.76 (performed due to patient's class III angina in spite of medical therapy).  Hemodynamically significant stenosis. RCA: Nondominant and small caliber vessel with mild disease in the midsegment.   Intervention data: Successful PTCA and stenting of the mid LAD with implantation of a 2.75 x 16 mm Synergy DES, stenosis reduced from 80% to 0% with TIMI-3 to TIMI-3 flow.  PFT 04/18/2024: mild obstructive lung disease with moderate diffusion defect  LDCT 03/2024: IMPRESSION: 1. Lung-RADS 2, benign appearance or behavior. Continue annual screening with low-dose chest CT without contrast in 12 months. 2. Mild cylindrical bronchiectasis. 3. Aortic atherosclerosis (ICD10-I70.0). Coronary artery calcification. 4.  Emphysema (ICD10-J43.9).    Assessment & Plan:   Assessment & Plan Dyspnea on exertion  Chronic obstructive pulmonary disease, unspecified COPD type (HCC)   No orders of the defined types were placed in this encounter.     No follow-ups on file.   Brittany Fernandez, MD    [1]  Current Outpatient Medications:    acetaminophen  (TYLENOL ) 650 MG CR tablet, Take 650-1,300 mg by mouth every 8 (eight) hours as needed for pain or fever., Disp: , Rfl:    albuterol  (PROVENTIL ) (2.5 MG/3ML) 0.083% nebulizer solution, Take 3 mLs (2.5 mg total) by nebulization every 6 (six) hours as  needed for wheezing or shortness of breath., Disp: 75 mL, Rfl: 5   albuterol  (VENTOLIN  HFA) 108 (90 Base) MCG/ACT inhaler, Inhale 2 puffs into the lungs every 6 (six)  hours as needed for wheezing or shortness of breath., Disp: 8.5 g, Rfl: 6   aspirin  EC 81 MG tablet, Take 1 tablet (81 mg total) by mouth daily. Swallow whole., Disp: 30 tablet, Rfl: 11   atorvastatin  (LIPITOR) 40 MG tablet, Take 1 tablet (40 mg total) by mouth daily., Disp: 30 tablet, Rfl: 1   bismuth subsalicylate (PEPTO BISMOL) 262 MG/15ML suspension, Take 30 mLs by mouth every 6 (six) hours as needed for indigestion or diarrhea or loose stools., Disp: , Rfl:    Blood Pressure Monitoring (OMRON 7 SERIES BP MONITOR) DEVI, 1 Units by Does not apply route as directed., Disp: 1 each, Rfl: 0   Cal Carb-Mag Hydrox-Simeth (ROLAIDS ADVANCED PO), Take 1 Dose by mouth as needed (Indigestion)., Disp: , Rfl:    carvedilol  (COREG ) 3.125 MG tablet, Take 1 tablet (3.125 mg total) by mouth 2 (two) times daily., Disp: 60 tablet, Rfl: 6   chlorthalidone  (HYGROTON ) 25 MG tablet, Take 1 tablet (25 mg total) by mouth daily., Disp: 30 tablet, Rfl: 5   clopidogrel  (PLAVIX ) 75 MG tablet, Take 1 tablet (75 mg total) by mouth daily., Disp: 90 tablet, Rfl: 3   DULoxetine  (CYMBALTA ) 60 MG capsule, Take 60 mg by mouth at bedtime., Disp: , Rfl:    gabapentin (NEURONTIN) 600 MG tablet, Take 600 mg by mouth 3 (three) times daily., Disp: , Rfl:    hydrOXYzine  (ATARAX ) 25 MG tablet, Take 25 mg by mouth 3 (three) times daily as needed for anxiety., Disp: , Rfl:    meloxicam  (MOBIC ) 7.5 MG tablet, Daily as needed after meal, Disp: 30 tablet, Rfl: 3   nitroGLYCERIN  (NITROSTAT ) 0.4 MG SL tablet, Place 1 tablet (0.4 mg total) under the tongue every 5 (five) minutes x 3 doses as needed (if no relief after 3rd dose proceed to ED or call 911)., Disp: 25 tablet, Rfl: 2   potassium chloride  SA (KLOR-CON  M) 20 MEQ tablet, Take 1 tablet (20 mEq total) by mouth daily., Disp: 30 tablet, Rfl: 5   Tiotropium Bromide-Olodaterol (STIOLTO RESPIMAT ) 2.5-2.5 MCG/ACT AERS, Inhale 2 puffs into the lungs daily., Disp: 4 g, Rfl: 6  "

## 2024-04-28 ENCOUNTER — Ambulatory Visit: Admitting: Pulmonary Disease

## 2024-04-28 ENCOUNTER — Encounter: Payer: Self-pay | Admitting: Pulmonary Disease

## 2024-04-28 VITALS — BP 139/88 | HR 81 | Ht 62.0 in | Wt 87.0 lb

## 2024-04-28 DIAGNOSIS — J439 Emphysema, unspecified: Secondary | ICD-10-CM | POA: Diagnosis not present

## 2024-04-28 DIAGNOSIS — F17200 Nicotine dependence, unspecified, uncomplicated: Secondary | ICD-10-CM

## 2024-04-28 DIAGNOSIS — J479 Bronchiectasis, uncomplicated: Secondary | ICD-10-CM | POA: Diagnosis not present

## 2024-04-28 DIAGNOSIS — F17210 Nicotine dependence, cigarettes, uncomplicated: Secondary | ICD-10-CM | POA: Diagnosis not present

## 2024-04-28 DIAGNOSIS — J449 Chronic obstructive pulmonary disease, unspecified: Secondary | ICD-10-CM

## 2024-04-28 MED ORDER — STIOLTO RESPIMAT 2.5-2.5 MCG/ACT IN AERS: 2.0000 | INHALATION_SPRAY | Freq: Every day | RESPIRATORY_TRACT | 6 refills | Status: AC

## 2024-04-28 MED ORDER — ALBUTEROL SULFATE (2.5 MG/3ML) 0.083% IN NEBU: 2.5000 mg | INHALATION_SOLUTION | Freq: Four times a day (QID) | RESPIRATORY_TRACT | 5 refills | Status: AC | PRN

## 2024-04-28 NOTE — Patient Instructions (Signed)
" °  VISIT SUMMARY: Today, we discussed your ongoing pulmonary conditions, including COPD, emphysema, and bronchiectasis. We reviewed your recent lung cancer screening results and addressed your symptoms and concerns, including anxiety, phlegm, and occasional nosebleeds. We also talked about your smoking habits and the importance of quitting smoking to improve your lung health.  YOUR PLAN: CHRONIC OBSTRUCTIVE PULMONARY DISEASE (COPD) WITH EMPHYSEMA AND BRONCHIECTASIS: You have mild COPD with emphysema and bronchiectasis, which affects your breathing and causes symptoms like coughing up blood and nosebleeds, likely due to dry air and mold exposure. -Continue using Stiolto Respimat , 2 puffs once daily. -Use your albuterol  nebulizer at least twice daily. -We have ordered a flutter valve for you to use after your nebulizer treatments to help clear your lungs. -Avoid exposure to mold and consider moving to [place]  for better living conditions. -You have been referred to a lung specialist in [place], [place] .  NICOTINE  DEPENDENCE: You currently smoke about two cigarettes per day, which can worsen your COPD and bronchiectasis. -It is very important that you quit smoking to prevent your lung disease from getting worse.  Contains text generated by [employer].   "

## 2024-04-29 ENCOUNTER — Encounter (HOSPITAL_COMMUNITY)

## 2024-05-06 ENCOUNTER — Encounter (HOSPITAL_COMMUNITY)

## 2024-05-08 ENCOUNTER — Encounter (HOSPITAL_COMMUNITY)

## 2024-05-13 ENCOUNTER — Encounter (HOSPITAL_COMMUNITY)

## 2024-05-15 ENCOUNTER — Encounter (HOSPITAL_COMMUNITY)

## 2024-09-08 ENCOUNTER — Ambulatory Visit: Admitting: Neurology
